# Patient Record
Sex: Male | Born: 1960 | Race: White | Hispanic: No | Marital: Married | Smoking: Current every day smoker
Health system: Southern US, Community
[De-identification: ages and names within clinical notes are randomized; demographics above are authoritative.]

## PROBLEM LIST (undated history)

## (undated) DIAGNOSIS — R079 Chest pain, unspecified: Secondary | ICD-10-CM

## (undated) DIAGNOSIS — R0600 Dyspnea, unspecified: Secondary | ICD-10-CM

## (undated) DIAGNOSIS — G629 Polyneuropathy, unspecified: Secondary | ICD-10-CM

## (undated) DIAGNOSIS — I639 Cerebral infarction, unspecified: Secondary | ICD-10-CM

## (undated) DIAGNOSIS — I209 Angina pectoris, unspecified: Secondary | ICD-10-CM

## (undated) DIAGNOSIS — G709 Myoneural disorder, unspecified: Secondary | ICD-10-CM

## (undated) DIAGNOSIS — I2510 Atherosclerotic heart disease of native coronary artery without angina pectoris: Secondary | ICD-10-CM

## (undated) DIAGNOSIS — F4310 Post-traumatic stress disorder, unspecified: Secondary | ICD-10-CM

## (undated) DIAGNOSIS — I10 Essential (primary) hypertension: Secondary | ICD-10-CM

## (undated) HISTORY — PX: APPENDECTOMY: SHX54

## (undated) HISTORY — DX: Atherosclerotic heart disease of native coronary artery without angina pectoris: I25.10

## (undated) HISTORY — PX: CORONARY ANGIOPLASTY WITH STENT PLACEMENT: SHX49

## (undated) HISTORY — PX: FRACTURE SURGERY: SHX138

## (undated) HISTORY — DX: Chest pain, unspecified: R07.9

---

## 2020-06-29 DIAGNOSIS — I2510 Atherosclerotic heart disease of native coronary artery without angina pectoris: Secondary | ICD-10-CM | POA: Insufficient documentation

## 2020-06-29 DIAGNOSIS — R079 Chest pain, unspecified: Secondary | ICD-10-CM

## 2020-07-02 ENCOUNTER — Other Ambulatory Visit: Payer: Self-pay

## 2020-07-02 ENCOUNTER — Encounter: Payer: Self-pay | Admitting: Cardiology

## 2020-07-02 ENCOUNTER — Ambulatory Visit: Payer: Managed Care, Other (non HMO) | Admitting: Cardiology

## 2020-07-02 VITALS — BP 130/90 | HR 78 | Ht 71.0 in | Wt 181.4 lb

## 2020-07-02 DIAGNOSIS — F17200 Nicotine dependence, unspecified, uncomplicated: Secondary | ICD-10-CM | POA: Diagnosis not present

## 2020-07-02 DIAGNOSIS — I25118 Atherosclerotic heart disease of native coronary artery with other forms of angina pectoris: Secondary | ICD-10-CM

## 2020-07-02 DIAGNOSIS — E782 Mixed hyperlipidemia: Secondary | ICD-10-CM | POA: Insufficient documentation

## 2020-07-02 DIAGNOSIS — I10 Essential (primary) hypertension: Secondary | ICD-10-CM | POA: Insufficient documentation

## 2020-07-02 MED ORDER — ASPIRIN EC 81 MG PO TBEC: 81.0000 mg | DELAYED_RELEASE_TABLET | Freq: Every day | ORAL | 3 refills | Status: AC

## 2020-07-02 MED ORDER — CARVEDILOL 6.25 MG PO TABS: 6.2500 mg | ORAL_TABLET | Freq: Two times a day (BID) | ORAL | 3 refills | Status: DC

## 2020-07-02 NOTE — Patient Instructions (Addendum)
Medication Instructions:  Your physician has recommended you make the following change in your medication:   START: Asprin 81 mg daily  START: coreg 6.25 mg BID *If you need a refill on your cardiac medications before your next appointment, please call your pharmacy*   Lab Work: Your physician recommends that you return for lab work:   3-7 days before Cath: BMET, Mag If you have labs (blood work) drawn today and your tests are completely normal, you will receive your results only by: Marland Kitchen MyChart Message (if you have MyChart) OR . A paper copy in the mail If you have any lab test that is abnormal or we need to change your treatment, we will call you to review the results.   Testing/Procedures: Dr. Servando Salina wants you to have a cath. Call [phone] to schedule.   Follow-Up: At [facility], you and your health needs are our priority.  As part of our continuing mission to provide you with exceptional heart care, we have created designated Provider Care Teams.  These Care Teams include your primary Cardiologist (physician) and Advanced Practice Providers (APPs -  Physician Assistants and Nurse Practitioners) who all work together to provide you with the care you need, when you need it.  We recommend signing up for the patient portal called "MyChart".  Sign up information is provided on this After Visit Summary.  MyChart is used to connect with patients for Virtual Visits (Telemedicine).  Patients are able to view lab/test results, encounter notes, upcoming appointments, etc.  Non-urgent messages can be sent to your provider as well.   To learn more about what you can do with MyChart, go to [email or web address].    Your next appointment:   6 week(s)  The format for your next appointment:   In Person  Provider:   Thomasene Ripple, DO   Other Instructions

## 2020-07-02 NOTE — Progress Notes (Signed)
Cardiology Office Note:    Date:  07/02/2020   ID:  Corey Salas, DOB [birth year], MRN [id number]  PCP:  Julianne Handler, NP  Cardiologist:  Thomasene Ripple, DO  Electrophysiologist:  None   Referring MD: Julianne Handler, NP     History of Present Illness:    Corey Salas is a 60 y.o. male with a hx of coronary artery disease status post PCI x5, his initial PCI was in 2006 secondary to MI with PCI to the RCA, then in 2009 he had 2 stents 1 to the RCA and the LAD and he tells me 7 years ago he had 2 stents placed in [place], hypertension, hyperlipidemia and smoker.  Since that time he stopped his aspirin and Plavix he stopped all of his cardiac medications.  He tells me he does not tolerate statin and he is never gone to take them.  His PCP has asked for him to be seen by cardiology as he has been experiencing intermittent chest pain.  He tells me that on exertion he has intermittent chest discomfort.  He described as a pressure-like sensation.  He does have some shortness of breath at times.   Past Medical History:  Diagnosis Date  . CAD (coronary artery disease)   . Chest pain     Past Surgical History:  Procedure Laterality Date  . APPENDECTOMY    . CORONARY ANGIOPLASTY WITH STENT PLACEMENT     5X    Current Medications: Current Meds  Medication Sig  . ALPRAZolam (XANAX) 0.5 MG tablet Take 0.5 mg by mouth at bedtime.  . busPIRone (BUSPAR) 5 MG tablet Take 5 mg by mouth daily.  . carvedilol (COREG) 6.25 MG tablet Take 1 tablet (6.25 mg total) by mouth 2 (two) times daily.  . nitroGLYCERIN (NITROLINGUAL) 0.4 MG/SPRAY spray Place 1 spray under the tongue daily as needed for chest pain.  . nitroGLYCERIN (NITROSTAT) 0.4 MG SL tablet Place 0.4 mg under the tongue every 5 (five) minutes as needed for chest pain.  . sildenafil (REVATIO) 20 MG tablet Take 20 mg by mouth at bedtime as needed.     Allergies:   Patient has no known allergies.   Social History   Socioeconomic History  .  Marital status: Unknown    Spouse name: Not on file  . Number of children: Not on file  . Years of education: Not on file  . Highest education level: Not on file  Occupational History  . Not on file  Tobacco Use  . Smoking status: Current Every Day Smoker  . Smokeless tobacco: Never Used  Substance and Sexual Activity  . Alcohol use: Yes    Alcohol/week: 7.0 standard drinks    Types: 7 Cans of beer per week  . Drug use: Not on file  . Sexual activity: Not on file  Other Topics Concern  . Not on file  Social History Narrative  . Not on file   Social Determinants of Health   Financial Resource Strain: Not on file  Food Insecurity: Not on file  Transportation Needs: Not on file  Physical Activity: Not on file  Stress: Not on file  Social Connections: Not on file     Family History: The patient's family history includes Heart attack in his father and mother.  ROS:   Review of Systems  Constitution: Negative for decreased appetite, fever and weight gain.  HENT: Negative for congestion, ear discharge, hoarse voice and sore throat.   Eyes: Negative for  discharge, redness, vision loss in right eye and visual halos.  Cardiovascular: Negative for chest pain, dyspnea on exertion, leg swelling, orthopnea and palpitations.  Respiratory: Negative for cough, hemoptysis, shortness of breath and snoring.   Endocrine: Negative for heat intolerance and polyphagia.  Hematologic/Lymphatic: Negative for bleeding problem. Does not bruise/bleed easily.  Skin: Negative for flushing, nail changes, rash and suspicious lesions.  Musculoskeletal: Negative for arthritis, joint pain, muscle cramps, myalgias, neck pain and stiffness.  Gastrointestinal: Negative for abdominal pain, bowel incontinence, diarrhea and excessive appetite.  Genitourinary: Negative for decreased libido, genital sores and incomplete emptying.  Neurological: Negative for brief paralysis, focal weakness, headaches and loss of  balance.  Psychiatric/Behavioral: Negative for altered mental status, depression and suicidal ideas.  Allergic/Immunologic: Negative for HIV exposure and persistent infections.    EKGs/Labs/Other Studies Reviewed:    The following studies were reviewed today:   EKG:  The ekg ordered today demonstrates sinus rhythm, heart rate 70 bpm with poor R wave progression in the precordial leads suggesting septal infarction of age indeterminate.  Recent Labs: No results found for requested labs within last 8760 hours.  Recent Lipid Panel No results found for: CHOL, TRIG, HDL, CHOLHDL, VLDL, LDLCALC, LDLDIRECT  Physical Exam:    VS:  BP 130/90 (BP Location: Right Arm)   Pulse 78   Ht 5\' 11"  (1.803 m)   Wt 181 lb 6.4 oz (82.3 kg)   SpO2 98%   BMI 25.30 kg/m     Wt Readings from Last 3 Encounters:  07/02/20 181 lb 6.4 oz (82.3 kg)     GEN: Well nourished, well developed in no acute distress HEENT: Normal NECK: No JVD; No carotid bruits LYMPHATICS: No lymphadenopathy CARDIAC: S1S2 noted,RRR, no murmurs, rubs, gallops RESPIRATORY:  Clear to auscultation without rales, wheezing or rhonchi  ABDOMEN: Soft, non-tender, non-distended, +bowel sounds, no guarding. EXTREMITIES: No edema, No cyanosis, no clubbing MUSCULOSKELETAL:  No deformity  SKIN: Warm and dry NEUROLOGIC:  Alert and oriented x 3, non-focal PSYCHIATRIC:  Normal affect, good insight  ASSESSMENT:    1. Coronary artery disease of native heart with stable angina pectoris, unspecified vessel or lesion type (HCC)   2. Primary hypertension   3. Mixed hyperlipidemia   4. Smoker    PLAN:    He is experiencing chest pain which I suspect is angina giving the patient coronary artery disease and multiple stents placement as well as the fact that he really does not take his cardiovascular medication which increases his risk for progression I like to proceed with a left heart catheterization in this patient.  Unfortunately he is not  really on board with this plan and tells me he is, have to call tomorrow after he thinks about it to get this testing scheduled.  He is willing to get back on aspirin 81 mg daily.  I am going to start it.  He is adamant that he will not take statin neither will he consider taking PCSK9 inhibitors.  His blood pressure is elevated in the office.  He said he stopped his blood pressure medications a while back.  We will start him on low-dose beta-blocker and titrate up as needed he is agreeable for this today.  I will also need an echocardiogram to assess his LV function but again he needs to wait and think about this.  Smoking cessation advised however patient tells me he is not ready to quit.  The patient is in agreement with the above plan. The patient  left the office in stable condition.  The patient will follow up in 8 weeks or sooner if needed.   Medication Adjustments/Labs and Tests Ordered: Current medicines are reviewed at length with the patient today.  Concerns regarding medicines are outlined above.  Orders Placed This Encounter  Procedures  . EKG 12-Lead   Meds ordered this encounter  Medications  . carvedilol (COREG) 6.25 MG tablet    Sig: Take 1 tablet (6.25 mg total) by mouth 2 (two) times daily.    Dispense:  180 tablet    Refill:  3    Patient Instructions  Medication Instructions:  Your physician has recommended you make the following change in your medication:   START: Asprin 81 mg daily  START: coreg 6.25 mg BID *If you need a refill on your cardiac medications before your next appointment, please call your pharmacy*   Lab Work: Your physician recommends that you return for lab work:   3-7 days before Cath: BMET, Mag If you have labs (blood work) drawn today and your tests are completely normal, you will receive your results only by: Marland Kitchen MyChart Message (if you have MyChart) OR . A paper copy in the mail If you have any lab test that is abnormal or we need to  change your treatment, we will call you to review the results.   Testing/Procedures: Dr. Servando Salina wants you to have a cath. Call [phone] to schedule.   Follow-Up: At [facility], you and your health needs are our priority.  As part of our continuing mission to provide you with exceptional heart care, we have created designated Provider Care Teams.  These Care Teams include your primary Cardiologist (physician) and Advanced Practice Providers (APPs -  Physician Assistants and Nurse Practitioners) who all work together to provide you with the care you need, when you need it.  We recommend signing up for the patient portal called "MyChart".  Sign up information is provided on this After Visit Summary.  MyChart is used to connect with patients for Virtual Visits (Telemedicine).  Patients are able to view lab/test results, encounter notes, upcoming appointments, etc.  Non-urgent messages can be sent to your provider as well.   To learn more about what you can do with MyChart, go to [email or web address].    Your next appointment:   6 week(s)  The format for your next appointment:   In Person  Provider:   Thomasene Ripple, DO   Other Instructions      Adopting a Healthy Lifestyle.  Know what a healthy weight is for you (roughly BMI <25) and aim to maintain this   Aim for 7+ servings of fruits and vegetables daily   65-80+ fluid ounces of water or unsweet tea for healthy kidneys   Limit to max 1 drink of alcohol per day; avoid smoking/tobacco   Limit animal fats in diet for cholesterol and heart health - choose grass fed whenever available   Avoid highly processed foods, and foods high in saturated/trans fats   Aim for low stress - take time to unwind and care for your mental health   Aim for 150 min of moderate intensity exercise weekly for heart health, and weights twice weekly for bone health   Aim for 7-9 hours of sleep daily   When it comes to diets, agreement about  the perfect plan isnt easy to find, even among the experts. Experts at the [employer] developed an idea known as the Healthy  Eating Plate. Just imagine a plate divided into logical, healthy portions.   The emphasis is on diet quality:   Load up on vegetables and fruits - one-half of your plate: Aim for color and variety, and remember that potatoes dont count.   Go for whole grains - one-quarter of your plate: Whole wheat, barley, wheat berries, quinoa, oats, brown rice, and foods made with them. If you want pasta, go with whole wheat pasta.   Protein power - one-quarter of your plate: Fish, chicken, beans, and nuts are all healthy, versatile protein sources. Limit red meat.   The diet, however, does go beyond the plate, offering a few other suggestions.   Use healthy plant oils, such as olive, canola, soy, corn, sunflower and peanut. Check the labels, and avoid partially hydrogenated oil, which have unhealthy trans fats.   If youre thirsty, drink water. Coffee and tea are good in moderation, but skip sugary drinks and limit milk and dairy products to one or two daily servings.   The type of carbohydrate in the diet is more important than the amount. Some sources of carbohydrates, such as vegetables, fruits, whole grains, and beans-are healthier than others.   Finally, stay active  Signed, Thomasene RippleKardie Demone Lyles, DO  07/02/2020 4:44 PM    [facility]

## 2020-07-02 NOTE — Addendum Note (Signed)
Addended by: Ulla Potash on: 07/02/2020 04:50 PM   Modules accepted: Orders

## 2020-08-06 DIAGNOSIS — I213 ST elevation (STEMI) myocardial infarction of unspecified site: Secondary | ICD-10-CM

## 2020-08-06 HISTORY — DX: ST elevation (STEMI) myocardial infarction of unspecified site: I21.3

## 2020-08-13 ENCOUNTER — Telehealth: Payer: Self-pay | Admitting: Cardiology

## 2020-08-13 NOTE — Telephone Encounter (Signed)
Pt requesting to switch provider from Dr. Servando Salina to Dr. Izora Ribas

## 2020-08-13 NOTE — Telephone Encounter (Signed)
While I am fine either way- reviewed Dr. Mallory Shirk note.  She has offered him quite a lot that sounds pretty reasonable.

## 2020-08-14 ENCOUNTER — Ambulatory Visit: Payer: Managed Care, Other (non HMO) | Admitting: Cardiology

## 2020-08-15 NOTE — Telephone Encounter (Signed)
That will be fine with me for the switch.

## 2020-08-21 ENCOUNTER — Inpatient Hospital Stay (HOSPITAL_COMMUNITY)
Admission: EM | Admit: 2020-08-21 | Discharge: 2020-08-22 | DRG: 064 | Disposition: A | Discharge status: Home / Self Care | Payer: Managed Care, Other (non HMO) | Attending: Internal Medicine | Admitting: Internal Medicine

## 2020-08-21 ENCOUNTER — Emergency Department (HOSPITAL_COMMUNITY): Payer: Managed Care, Other (non HMO)

## 2020-08-21 ENCOUNTER — Other Ambulatory Visit: Payer: Self-pay

## 2020-08-21 ENCOUNTER — Encounter (HOSPITAL_COMMUNITY): Payer: Self-pay | Admitting: Internal Medicine

## 2020-08-21 DIAGNOSIS — Z79899 Other long term (current) drug therapy: Secondary | ICD-10-CM

## 2020-08-21 DIAGNOSIS — I63232 Cerebral infarction due to unspecified occlusion or stenosis of left carotid arteries: Principal | ICD-10-CM | POA: Diagnosis present

## 2020-08-21 DIAGNOSIS — E782 Mixed hyperlipidemia: Secondary | ICD-10-CM | POA: Diagnosis present

## 2020-08-21 DIAGNOSIS — R471 Dysarthria and anarthria: Secondary | ICD-10-CM | POA: Diagnosis present

## 2020-08-21 DIAGNOSIS — I10 Essential (primary) hypertension: Secondary | ICD-10-CM | POA: Diagnosis not present

## 2020-08-21 DIAGNOSIS — R29705 NIHSS score 5: Secondary | ICD-10-CM | POA: Diagnosis present

## 2020-08-21 DIAGNOSIS — I213 ST elevation (STEMI) myocardial infarction of unspecified site: Secondary | ICD-10-CM | POA: Diagnosis present

## 2020-08-21 DIAGNOSIS — Z7982 Long term (current) use of aspirin: Secondary | ICD-10-CM

## 2020-08-21 DIAGNOSIS — Z8249 Family history of ischemic heart disease and other diseases of the circulatory system: Secondary | ICD-10-CM | POA: Diagnosis not present

## 2020-08-21 DIAGNOSIS — Z20822 Contact with and (suspected) exposure to covid-19: Secondary | ICD-10-CM | POA: Diagnosis present

## 2020-08-21 DIAGNOSIS — I639 Cerebral infarction, unspecified: Secondary | ICD-10-CM | POA: Diagnosis present

## 2020-08-21 DIAGNOSIS — R260 Ataxic gait: Secondary | ICD-10-CM | POA: Diagnosis present

## 2020-08-21 DIAGNOSIS — I2510 Atherosclerotic heart disease of native coronary artery without angina pectoris: Secondary | ICD-10-CM | POA: Diagnosis not present

## 2020-08-21 DIAGNOSIS — G8191 Hemiplegia, unspecified affecting right dominant side: Secondary | ICD-10-CM | POA: Diagnosis present

## 2020-08-21 DIAGNOSIS — Z955 Presence of coronary angioplasty implant and graft: Secondary | ICD-10-CM | POA: Diagnosis not present

## 2020-08-21 DIAGNOSIS — F4310 Post-traumatic stress disorder, unspecified: Secondary | ICD-10-CM | POA: Diagnosis present

## 2020-08-21 DIAGNOSIS — F17200 Nicotine dependence, unspecified, uncomplicated: Secondary | ICD-10-CM | POA: Diagnosis present

## 2020-08-21 DIAGNOSIS — I6522 Occlusion and stenosis of left carotid artery: Secondary | ICD-10-CM | POA: Diagnosis not present

## 2020-08-21 DIAGNOSIS — R29810 Facial weakness: Secondary | ICD-10-CM | POA: Diagnosis present

## 2020-08-21 DIAGNOSIS — Z8489 Family history of other specified conditions: Secondary | ICD-10-CM

## 2020-08-21 HISTORY — DX: Family history of other specified conditions: Z84.89

## 2020-08-21 HISTORY — DX: Post-traumatic stress disorder, unspecified: F43.10

## 2020-08-21 LAB — I-STAT CHEM 8, ED
BUN: 7 mg/dL (ref 6–20)
Calcium, Ion: 1.11 mmol/L — ABNORMAL LOW (ref 1.15–1.40)
Chloride: 108 mmol/L (ref 98–111)
Creatinine, Ser: 1 mg/dL (ref 0.61–1.24)
Glucose, Bld: 78 mg/dL (ref 70–99)
HCT: 53 % — ABNORMAL HIGH (ref 39.0–52.0)
Hemoglobin: 18 g/dL — ABNORMAL HIGH (ref 13.0–17.0)
Potassium: 3.8 mmol/L (ref 3.5–5.1)
Sodium: 141 mmol/L (ref 135–145)
TCO2: 21 mmol/L — ABNORMAL LOW (ref 22–32)

## 2020-08-21 LAB — COMPREHENSIVE METABOLIC PANEL
ALT: 38 U/L (ref 0–44)
AST: 34 U/L (ref 15–41)
Albumin: 4.2 g/dL (ref 3.5–5.0)
Alkaline Phosphatase: 58 U/L (ref 38–126)
Anion gap: 12 (ref 5–15)
BUN: 7 mg/dL (ref 6–20)
CO2: 22 mmol/L (ref 22–32)
Calcium: 10.2 mg/dL (ref 8.9–10.3)
Chloride: 106 mmol/L (ref 98–111)
Creatinine, Ser: 0.92 mg/dL (ref 0.61–1.24)
GFR, Estimated: >60 mL/min (ref >60)
Glucose, Bld: 78 mg/dL (ref 70–99)
Potassium: 3.8 mmol/L (ref 3.5–5.1)
Sodium: 140 mmol/L (ref 135–145)
Total Bilirubin: 0.7 mg/dL (ref 0.3–1.2)
Total Protein: 7.3 g/dL (ref 6.5–8.1)

## 2020-08-21 LAB — CK: Total CK: 216 U/L (ref 49–397)

## 2020-08-21 LAB — CBC
HCT: 53.3 % — ABNORMAL HIGH (ref 39.0–52.0)
Hemoglobin: 18.1 g/dL — ABNORMAL HIGH (ref 13.0–17.0)
MCH: 34.8 pg — ABNORMAL HIGH (ref 26.0–34.0)
MCHC: 34 g/dL (ref 30.0–36.0)
MCV: 102.5 fL — ABNORMAL HIGH (ref 80.0–100.0)
Platelets: 274 10*3/uL (ref 150–400)
RBC: 5.2 MIL/uL (ref 4.22–5.81)
RDW: 13.3 % (ref 11.5–15.5)
WBC: 10.8 10*3/uL — ABNORMAL HIGH (ref 4.0–10.5)
nRBC: 0 % (ref 0.0–0.2)

## 2020-08-21 LAB — DIFFERENTIAL
Abs Immature Granulocytes: 0.06 10*3/uL (ref 0.00–0.07)
Basophils Absolute: 0.1 10*3/uL (ref 0.0–0.1)
Basophils Relative: 1 %
Eosinophils Absolute: 0.4 10*3/uL (ref 0.0–0.5)
Eosinophils Relative: 3 %
Immature Granulocytes: 1 %
Lymphocytes Relative: 41 %
Lymphs Abs: 4.4 10*3/uL — ABNORMAL HIGH (ref 0.7–4.0)
Monocytes Absolute: 0.5 10*3/uL (ref 0.1–1.0)
Monocytes Relative: 5 %
Neutro Abs: 5.3 10*3/uL (ref 1.7–7.7)
Neutrophils Relative %: 49 %

## 2020-08-21 LAB — PROTIME-INR
INR: 1 (ref 0.8–1.2)
Prothrombin Time: 12.9 seconds (ref 11.4–15.2)

## 2020-08-21 LAB — RESP PANEL BY RT-PCR (FLU A&B, COVID) ARPGX2
Influenza A by PCR: NEGATIVE
Influenza B by PCR: NEGATIVE
SARS Coronavirus 2 by RT PCR: NEGATIVE

## 2020-08-21 LAB — APTT: aPTT: 33 seconds (ref 24–36)

## 2020-08-21 LAB — CBG MONITORING, ED: Glucose-Capillary: 85 mg/dL (ref 70–99)

## 2020-08-21 MED ORDER — ROSUVASTATIN CALCIUM 20 MG PO TABS: 20.0000 mg | ORAL_TABLET | Freq: Every day | ORAL | Status: DC

## 2020-08-21 MED ORDER — ACETAMINOPHEN 650 MG RE SUPP: 650.0000 mg | RECTAL | Status: DC | PRN

## 2020-08-21 MED ORDER — ADULT MULTIVITAMIN W/MINERALS CH
1.0000 | ORAL_TABLET | Freq: Every day | ORAL | Status: DC
  Administered 2020-08-22: 1 via ORAL
  Filled 2020-08-21: qty 1

## 2020-08-21 MED ORDER — ACETAMINOPHEN 325 MG PO TABS: 650.0000 mg | ORAL_TABLET | ORAL | Status: DC | PRN

## 2020-08-21 MED ORDER — SODIUM CHLORIDE 0.9% FLUSH
3.0000 mL | Freq: Once | INTRAVENOUS | Status: AC
Start: 2020-08-21 — End: 2020-08-21
  Administered 2020-08-21: 3 mL via INTRAVENOUS

## 2020-08-21 MED ORDER — ASPIRIN EC 325 MG PO TBEC
650.0000 mg | DELAYED_RELEASE_TABLET | Freq: Once | ORAL | Status: DC
  Filled 2020-08-21: qty 2

## 2020-08-21 MED ORDER — LORAZEPAM 2 MG/ML IJ SOLN
1.0000 mg | Freq: Once | INTRAMUSCULAR | Status: AC
  Administered 2020-08-21: 1 mg via INTRAVENOUS
  Filled 2020-08-21: qty 1

## 2020-08-21 MED ORDER — IOHEXOL 350 MG/ML SOLN
75.0000 mL | Freq: Once | INTRAVENOUS | Status: AC | PRN
  Administered 2020-08-21: 75 mL via INTRAVENOUS

## 2020-08-21 MED ORDER — ACETAMINOPHEN 160 MG/5ML PO SOLN: 650.0000 mg | ORAL | Status: DC | PRN

## 2020-08-21 MED ORDER — STROKE: EARLY STAGES OF RECOVERY BOOK
Freq: Once | Status: AC
  Filled 2020-08-21: qty 1

## 2020-08-21 MED ORDER — ASPIRIN EC 81 MG PO TBEC: 81.0000 mg | DELAYED_RELEASE_TABLET | Freq: Every day | ORAL | Status: DC

## 2020-08-21 MED ORDER — TICAGRELOR 90 MG PO TABS
90.0000 mg | ORAL_TABLET | Freq: Two times a day (BID) | ORAL | Status: DC
  Administered 2020-08-22: 90 mg via ORAL
  Filled 2020-08-21: qty 1

## 2020-08-21 MED ORDER — ENOXAPARIN SODIUM 40 MG/0.4ML ~~LOC~~ SOLN
40.0000 mg | SUBCUTANEOUS | Status: DC
  Administered 2020-08-22: 40 mg via SUBCUTANEOUS
  Filled 2020-08-21: qty 0.4

## 2020-08-21 NOTE — ED Notes (Signed)
Patient transported to MRI 

## 2020-08-21 NOTE — ED Provider Notes (Signed)
[facility] EMERGENCY DEPARTMENT Provider Note   CSN: [id number] Arrival date & time: 08/21/20  2030     History Chief Complaint  Patient presents with  . Code Stroke    Hobson Lax is a 60 y.o. male.  Patient states that at 3 AM, about 14 hours ago he started to have some right-sided weakness and slurred speech.  He was awake working on the computer and he thought he was tired so he went to bed.  Woke up at 9:00 and symptoms continued.  Eventually family member convinced him to come to the hospital.  Code stroke was activated in the field given concern for LVO stroke.  Patient had recent heart attack and is on Brilinta.  The history is provided by the patient.  Cerebrovascular Accident This is a new problem. The current episode started 12 to 24 hours ago. The problem occurs constantly. The problem has not changed since onset.Pertinent negatives include no chest pain, no abdominal pain, no headaches and no shortness of breath. Nothing aggravates the symptoms. He has tried nothing for the symptoms. The treatment provided no relief.       Past Medical History:  Diagnosis Date  . CAD (coronary artery disease)   . Chest pain   . PTSD (post-traumatic stress disorder)   . STEMI (ST elevation myocardial infarction) (HCC) 08/06/2020    Patient Active Problem List   Diagnosis Date Noted  . Acute ischemic stroke (HCC) 08/21/2020  . Primary hypertension 07/02/2020  . Mixed hyperlipidemia 07/02/2020  . Smoker 07/02/2020  . Chest pain   . CAD (coronary artery disease)     Past Surgical History:  Procedure Laterality Date  . APPENDECTOMY    . CORONARY ANGIOPLASTY WITH STENT PLACEMENT     6X       Family History  Problem Relation Age of Onset  . Heart attack Mother   . Heart attack Father     Social History   Tobacco Use  . Smoking status: Current Every Day Smoker  . Smokeless tobacco: Never Used  Substance Use Topics  . Alcohol use: Yes     Alcohol/week: 7.0 standard drinks    Types: 7 Cans of beer per week    Home Medications Prior to Admission medications   Medication Sig Start Date End Date Taking? Authorizing Provider  aspirin EC 81 MG tablet Take 1 tablet (81 mg total) by mouth daily. Swallow whole. 07/02/20  Yes Tobb, Kardie, DO  BRILINTA 90 MG TABS tablet Take 90 mg by mouth 2 (two) times daily. 08/07/20  Yes [provider]  carvedilol (COREG) 12.5 MG tablet Take 12.5 mg by mouth 2 (two) times daily. 08/07/20  Yes [provider]  Multiple Vitamins-Minerals (CENTRUM SILVER 50+MEN) TABS Take 1 tablet by mouth daily.   Yes [provider]  nitroGLYCERIN (NITROLINGUAL) 0.4 MG/SPRAY spray Place 1 spray under the tongue daily as needed for chest pain. 06/29/20  Yes [provider]  nitroGLYCERIN (NITROSTAT) 0.4 MG SL tablet Place 0.4 mg under the tongue every 5 (five) minutes as needed for chest pain.   Yes [provider]  rosuvastatin (CRESTOR) 20 MG tablet Take 20 mg by mouth daily. 08/07/20  Yes [provider]  sildenafil (REVATIO) 20 MG tablet Take 20 mg by mouth daily as needed (ed).   Yes [provider]  vitamin B-12 (CYANOCOBALAMIN) 100 MCG tablet Take 100 mcg by mouth daily.   Yes [provider]    Allergies  Patient has no known allergies.  Review of Systems   Review of Systems  Constitutional: Negative for chills and fever.  HENT: Negative for ear pain and sore throat.   Eyes: Negative for pain and visual disturbance.  Respiratory: Negative for cough and shortness of breath.   Cardiovascular: Negative for chest pain and palpitations.  Gastrointestinal: Negative for abdominal pain and vomiting.  Genitourinary: Negative for dysuria and hematuria.  Musculoskeletal: Negative for arthralgias and back pain.  Skin: Negative for color change and rash.  Neurological: Positive for facial asymmetry, speech difficulty, weakness and numbness. Negative  for dizziness, tremors, seizures, syncope, light-headedness and headaches.  All other systems reviewed and are negative.   Physical Exam Updated Vital Signs BP 126/88   Pulse 81   Temp 97.7 F (36.5 C) (Oral)   Resp 15   Ht 5\' 11"  (1.803 m)   Wt 80.1 kg   SpO2 94%   BMI 24.63 kg/m   Physical Exam Vitals and nursing note reviewed.  Constitutional:      General: He is not in acute distress.    Appearance: He is well-developed. He is not ill-appearing.  HENT:     Head: Normocephalic and atraumatic.     Nose: Nose normal.     Mouth/Throat:     Mouth: Mucous membranes are moist.  Eyes:     Extraocular Movements: Extraocular movements intact.     Conjunctiva/sclera: Conjunctivae normal.     Pupils: Pupils are equal, round, and reactive to light.  Cardiovascular:     Rate and Rhythm: Normal rate and regular rhythm.     Pulses: Normal pulses.     Heart sounds: Normal heart sounds. No murmur heard.   Pulmonary:     Effort: Pulmonary effort is normal. No respiratory distress.     Breath sounds: Normal breath sounds.  Abdominal:     Palpations: Abdomen is soft.     Tenderness: There is no abdominal tenderness.  Musculoskeletal:     Cervical back: Normal range of motion and neck supple.  Skin:    General: Skin is warm and dry.     Capillary Refill: Capillary refill takes less than 2 seconds.  Neurological:     Mental Status: He is alert.     Sensory: Sensory deficit present.     Motor: Weakness present.     Comments: Right-sided facial weakness, right upper extremity weakness, right lower leg extremity weakness about 4+ out of 5, 5+ out of 5 strength elsewhere, decree sensation of the right side of his face otherwise normal sensation.  Slurred speech, some mild limb ataxia in the right upper extremity, no visual field deficit     ED Results / Procedures / Treatments   Labs (all labs ordered are listed, but only abnormal results are displayed) Labs Reviewed  CBC -  Abnormal; Notable for the following components:      Result Value   WBC 10.8 (*)    Hemoglobin 18.1 (*)    HCT 53.3 (*)    MCV 102.5 (*)    MCH 34.8 (*)    All other components within normal limits  DIFFERENTIAL - Abnormal; Notable for the following components:   Lymphs Abs 4.4 (*)    All other components within normal limits  I-STAT CHEM 8, ED - Abnormal; Notable for the following components:   Calcium, Ion 1.11 (*)    TCO2 21 (*)    Hemoglobin 18.0 (*)    HCT 53.0 (*)  All other components within normal limits  RESP PANEL BY RT-PCR (FLU A&B, COVID) ARPGX2  PROTIME-INR  APTT  COMPREHENSIVE METABOLIC PANEL  CK  HIV ANTIBODY (ROUTINE TESTING W REFLEX)  HEMOGLOBIN A1C  LIPID PANEL  CBG MONITORING, ED    EKG None  Radiology MR BRAIN WO CONTRAST  Result Date: 08/21/2020 CLINICAL DATA:  Acute neurologic deficit EXAM: MRI HEAD WITHOUT CONTRAST TECHNIQUE: Multiplanar, multiecho pulse sequences of the brain and surrounding structures were obtained without intravenous contrast. COMPARISON:  None. FINDINGS: Brain: Small focus of abnormal diffusion restriction within the left centrum semiovale. No acute or chronic hemorrhage. There is multifocal hyperintense T2-weighted signal within the white matter. Generalized volume loss without a clear lobar predilection. The midline structures are normal. Vascular: Major flow voids are preserved. Skull and upper cervical spine: Normal calvarium and skull base. Visualized upper cervical spine and soft tissues are normal. Sinuses/Orbits:No paranasal sinus fluid levels or advanced mucosal thickening. No mastoid or middle ear effusion. Normal orbits. IMPRESSION: 1. Small focus of acute/early subacute ischemia within the left centrum semiovale. No hemorrhage or mass effect. 2. Findings of chronic microvascular ischemia and generalized volume loss. Electronically Signed   By: Deatra Robinson M.D.   On: 08/21/2020 23:11   CT HEAD CODE STROKE WO  CONTRAST  Result Date: 08/21/2020 CLINICAL DATA:  Code stroke.  Neuro deficit, acute stroke suspected. EXAM: CT HEAD WITHOUT CONTRAST TECHNIQUE: Contiguous axial images were obtained from the base of the skull through the vertex without intravenous contrast. COMPARISON:  None. FINDINGS: Brain: No evidence of acute large vascular territory infarction, hemorrhage, hydrocephalus, extra-axial collection or mass lesion/mass effect. Patchy white matter hypoattenuation, most likely related to chronic microvascular ischemic disease. Vascular: No hyperdense vessel identified. Calcific atherosclerosis. Skull: No acute fracture. Sinuses/Orbits: Inferior right maxillary sinus retention cyst. Unremarkable orbits. Other: No mastoid effusions. ASPECTS [facility] Stroke Program Early CT Score) Total score (0-10 with 10 being normal): 10 IMPRESSION: 1. No evidence of acute intracranial abnormality. ASPECTS is 10. 2. 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 Code stroke imaging results were communicated on 08/21/2020 at 8:50 pm to provider Dr. Otelia Limes via secure text paging. Electronically Signed   By: Feliberto Harts MD   On: 08/21/2020 20:52   CT ANGIO HEAD NECK W WO CM (CODE STROKE)  Result Date: 08/21/2020 CLINICAL DATA:  Headache and weakness EXAM: CT ANGIOGRAPHY HEAD AND NECK TECHNIQUE: Multidetector CT imaging of the head and neck was performed using the standard protocol during bolus administration of intravenous contrast. Multiplanar CT image reconstructions and MIPs were obtained to evaluate the vascular anatomy. Carotid stenosis measurements (when applicable) are obtained utilizing NASCET criteria, using the distal internal carotid diameter as the denominator. CONTRAST:  60mL OMNIPAQUE IOHEXOL 350 MG/ML SOLN COMPARISON:  None. FINDINGS: CTA NECK FINDINGS SKELETON: There is no bony spinal canal stenosis. No lytic or blastic lesion. OTHER NECK: Normal pharynx, larynx and major salivary glands. No cervical  lymphadenopathy. Unremarkable thyroid gland. UPPER CHEST: No pneumothorax or pleural effusion. No nodules or masses. AORTIC ARCH: There is calcific atherosclerosis of the aortic arch. There is no aneurysm, dissection or hemodynamically significant stenosis of the visualized portion of the aorta. Conventional 3 vessel aortic branching pattern. The visualized proximal subclavian arteries are widely patent. RIGHT CAROTID SYSTEM: Normal without aneurysm, dissection or stenosis. LEFT CAROTID SYSTEM: No dissection, occlusion or aneurysm. There is mixed density atherosclerosis extending into the proximal ICA, resulting in 60% stenosis. VERTEBRAL ARTERIES: Left dominant configuration. Both origins are clearly patent. There is no  dissection, occlusion or flow-limiting stenosis to the skull base (V1-V3 segments). Right vertebral artery terminates proximal to the skull base. CTA HEAD FINDINGS POSTERIOR CIRCULATION: --Vertebral arteries: Normal left V4 segment. Right vertebral artery terminates proximal to the skull base. --Inferior cerebellar arteries: Normal. --Basilar artery: Normal. --Superior cerebellar arteries: Normal. --Posterior cerebral arteries (PCA): Normal. ANTERIOR CIRCULATION: --Intracranial internal carotid arteries: Normal. --Anterior cerebral arteries (ACA): Normal. Both A1 segments are present. Patent anterior communicating artery (a-comm). --Middle cerebral arteries (MCA): Normal. VENOUS SINUSES: As permitted by contrast timing, patent. ANATOMIC VARIANTS: None Review of the MIP images confirms the above findings. IMPRESSION: 1. No intracranial arterial occlusion or high-grade stenosis. 2. Approximately 60% stenosis of the proximal left internal carotid artery secondary to mixed density atherosclerosis. Aortic Atherosclerosis (ICD10-I70.0). Electronically Signed   By: Deatra Robinson M.D.   On: 08/21/2020 21:14    Procedures .Critical Care Performed by: Virgina Norfolk, DO Authorized by: Virgina Norfolk, DO    Critical care provider statement:    Critical care time (minutes):  35   Critical care was necessary to treat or prevent imminent or life-threatening deterioration of the following conditions:  CNS failure or compromise   Critical care was time spent personally by me on the following activities:  Blood draw for specimens, development of treatment plan with patient or surrogate, discussions with primary provider, discussions with consultants, evaluation of patient's response to treatment, examination of patient, obtaining history from patient or surrogate, ordering and performing treatments and interventions, ordering and review of laboratory studies, ordering and review of radiographic studies, review of old charts, re-evaluation of patient's condition and pulse oximetry   I assumed direction of critical care for this patient from another provider in my specialty: no     Care discussed with: admitting provider       Medications Ordered in ED Medications  aspirin EC tablet 650 mg (0 mg Oral Hold 08/21/20 2224)   stroke: mapping our early stages of recovery book (has no administration in time range)  acetaminophen (TYLENOL) tablet 650 mg (has no administration in time range)    Or  acetaminophen (TYLENOL) 160 MG/5ML solution 650 mg (has no administration in time range)    Or  acetaminophen (TYLENOL) suppository 650 mg (has no administration in time range)  enoxaparin (LOVENOX) injection 40 mg (has no administration in time range)  multivitamin with minerals tablet 1 tablet (has no administration in time range)  rosuvastatin (CRESTOR) tablet 20 mg (has no administration in time range)  aspirin EC tablet 81 mg (has no administration in time range)  ticagrelor (BRILINTA) tablet 90 mg (has no administration in time range)  sodium chloride flush (NS) 0.9 % injection 3 mL (3 mLs Intravenous Given 08/21/20 2223)  iohexol (OMNIPAQUE) 350 MG/ML injection 75 mL (75 mLs Intravenous Contrast Given  08/21/20 2102)  LORazepam (ATIVAN) injection 1 mg (1 mg Intravenous Given 08/21/20 2217)    ED Course  I have reviewed the triage vital signs and the nursing notes.  Pertinent labs & imaging results that were available during my care of the patient were reviewed by me and considered in my medical decision making (see chart for details).    MDM Rules/Calculators/A&P                          Otho Michalik is here as a code stroke.  60 year old male.  History of CAD status post stent on Brilinta.  Presents with right-sided weakness and slurred speech.  Started about 15 hours ago.  Delayed presentation as patient went to sleep when symptoms first started.  Family finally encouraged him to come.  Head CT negative for head bleed.  CTA did not show large vessel occlusion.  Not a candidate for thrombectomy.  Outside the window for tPA.  MRI does show small focus of acute or early subacute ischemic within the left centrum semiovale.  Lab work otherwise unremarkable.  Neurology was at the bedside upon arrival.  Patient to be admitted to medicine for further stroke care. Normal Vitals.  This chart was dictated using voice recognition software.  Despite best efforts to proofread,  errors can occur which can change the documentation meaning.    Final Clinical Impression(s) / ED Diagnoses Final diagnoses:  Cerebrovascular accident (CVA), unspecified mechanism [facility](HCC)    Rx / DC Orders ED Discharge Orders    None       Virgina NorfolkCuratolo, Demetrice Combes, DO 08/21/20 2337

## 2020-08-21 NOTE — Consult Note (Addendum)
NEURO HOSPITALIST CONSULT NOTE   Requesting physician: Dr. Lockie Molauratolo  Reason for Consult: Acute onset of right sided weakness, right facial droop, dysarthria and R > L ataxia  History obtained from:  Patient   HPI:                                                                                                                                          Corey Salas is a 60 y.o. male with a PMHx of CAD s/p angioplasty and stent placement x 5, presenting [facility] via EMS as a Code Stroke. LKN was 0300 after he had drank about 6 beers. On awakening he noted that he was dysarthric, with incoordination, right facial droop and headache. NIHSS was 5. He has no prior history of stroke.   Home medications include ASA.   tPA given: No, out of time window Endovascular candidate: No, exam findings not consistent with LVO  Past Medical History:  Diagnosis Date  . CAD (coronary artery disease)   . Chest pain     Past Surgical History:  Procedure Laterality Date  . APPENDECTOMY    . CORONARY ANGIOPLASTY WITH STENT PLACEMENT     5X    Family History  Problem Relation Age of Onset  . Heart attack Mother   . Heart attack Father               Social History:  reports that he has been smoking. He has never used smokeless tobacco. He reports current alcohol use of about 7.0 standard drinks of alcohol per week. No history on file for drug use.  No Known Allergies  HOME MEDICATIONS:                                                                                                                      No current facility-administered medications on file prior to encounter.   Current Outpatient Medications on File Prior to Encounter  Medication Sig Dispense Refill  . ALPRAZolam (XANAX) 0.5 MG tablet Take 0.5 mg by mouth at bedtime.    Marland Kitchen. aspirin EC 81 MG tablet Take 1 tablet (81 mg total) by mouth daily. Swallow whole. 90 tablet 3  . busPIRone (BUSPAR) 5 MG tablet Take 5 mg  by mouth  daily.    . carvedilol (COREG) 6.25 MG tablet Take 1 tablet (6.25 mg total) by mouth 2 (two) times daily. 180 tablet 3  . nitroGLYCERIN (NITROLINGUAL) 0.4 MG/SPRAY spray Place 1 spray under the tongue daily as needed for chest pain.    . nitroGLYCERIN (NITROSTAT) 0.4 MG SL tablet Place 0.4 mg under the tongue every 5 (five) minutes as needed for chest pain.    . sildenafil (REVATIO) 20 MG tablet Take 20 mg by mouth at bedtime as needed.      ROS:                                                                                                                                       As per HPI. Detailed ROS deferred in the context of acuity of presentation.    Blood pressure (!) 147/96, pulse 93, temperature 97.7 F (36.5 C), temperature source Oral, resp. rate (!) 23, height 5\' 11"  (1.803 m), weight 80.1 kg, SpO2 96 %.   General Examination:                                                                                                       Physical Exam  HEENT-  [place]/AT    Lungs- Respirations unlabored Extremities- No edema  Neurological Examination Mental Status: Awake and alert. Fully oriented x 5. Speech is fluent, but with moderate dysarthria. Naming and repetition intact. Comprehension intact.  Cranial Nerves: II: Visual fields intact. PERRL.  III,IV, VI: No ptosis. EOMI with saccadic pursuits noted.  V,VII: Right facial droop. Facial temp sensation decreased on the right.  VIII: hearing intact to questions and commands IX,X: Pharyngeal dysarthria is noted.  XI: Head is midline XII: Midline tongue extension Motor: RUE 5/5 except for 4+/5 biceps and 4/5 grip RLE 4+/5 LUE and LLE 5/5 No pronator drift Sensory: Temp and FT sensation intact to BUE and BLE without asymmetry. No extinction to DSS.  Deep Tendon Reflexes: 3+ bilateral brachioradialis and patellae. Toes mute bilaterally  Cerebellar: Mild dystaxia with FNF and H-S bilaterally, R > L Gait: Deferred in the  context of acuity of presentation.    Lab Results: Basic Metabolic Panel: Recent Labs  Lab 08/21/20 2033 08/21/20 2040  NA 140 141  K 3.8 3.8  CL 106 108  CO2 22  --   GLUCOSE 78 78  BUN 7 7  CREATININE 0.92 1.00  CALCIUM 10.2  --     CBC: Recent Labs  Lab 08/21/20  2033 08/21/20 2040  WBC 10.8*  --   NEUTROABS 5.3  --   HGB 18.1* 18.0*  HCT 53.3* 53.0*  MCV 102.5*  --   PLT 274  --     Cardiac Enzymes: No results for input(s): CKTOTAL, CKMB, CKMBINDEX, TROPONINI in the last 168 hours.  Lipid Panel: No results for input(s): CHOL, TRIG, HDL, CHOLHDL, VLDL, LDLCALC in the last 168 hours.  Imaging: CT HEAD CODE STROKE WO CONTRAST  Result Date: 08/21/2020 CLINICAL DATA:  Code stroke.  Neuro deficit, acute stroke suspected. EXAM: CT HEAD WITHOUT CONTRAST TECHNIQUE: Contiguous axial images were obtained from the base of the skull through the vertex without intravenous contrast. COMPARISON:  None. FINDINGS: Brain: No evidence of acute large vascular territory infarction, hemorrhage, hydrocephalus, extra-axial collection or mass lesion/mass effect. Patchy white matter hypoattenuation, most likely related to chronic microvascular ischemic disease. Vascular: No hyperdense vessel identified. Calcific atherosclerosis. Skull: No acute fracture. Sinuses/Orbits: Inferior right maxillary sinus retention cyst. Unremarkable orbits. Other: No mastoid effusions. ASPECTS [facility] Stroke Program Early CT Score) Total score (0-10 with 10 being normal): 10 IMPRESSION: 1. No evidence of acute intracranial abnormality. ASPECTS is 10. 2. 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 Code stroke imaging results were communicated on 08/21/2020 at 8:50 pm to provider Dr. Otelia Limes via secure text paging. Electronically Signed   By: Feliberto Harts MD   On: 08/21/2020 20:52   CT ANGIO HEAD NECK W WO CM (CODE STROKE)  Result Date: 08/21/2020 CLINICAL DATA:  Headache and weakness EXAM: CT ANGIOGRAPHY  HEAD AND NECK TECHNIQUE: Multidetector CT imaging of the head and neck was performed using the standard protocol during bolus administration of intravenous contrast. Multiplanar CT image reconstructions and MIPs were obtained to evaluate the vascular anatomy. Carotid stenosis measurements (when applicable) are obtained utilizing NASCET criteria, using the distal internal carotid diameter as the denominator. CONTRAST:  62mL OMNIPAQUE IOHEXOL 350 MG/ML SOLN COMPARISON:  None. FINDINGS: CTA NECK FINDINGS SKELETON: There is no bony spinal canal stenosis. No lytic or blastic lesion. OTHER NECK: Normal pharynx, larynx and major salivary glands. No cervical lymphadenopathy. Unremarkable thyroid gland. UPPER CHEST: No pneumothorax or pleural effusion. No nodules or masses. AORTIC ARCH: There is calcific atherosclerosis of the aortic arch. There is no aneurysm, dissection or hemodynamically significant stenosis of the visualized portion of the aorta. Conventional 3 vessel aortic branching pattern. The visualized proximal subclavian arteries are widely patent. RIGHT CAROTID SYSTEM: Normal without aneurysm, dissection or stenosis. LEFT CAROTID SYSTEM: No dissection, occlusion or aneurysm. There is mixed density atherosclerosis extending into the proximal ICA, resulting in 60% stenosis. VERTEBRAL ARTERIES: Left dominant configuration. Both origins are clearly patent. There is no dissection, occlusion or flow-limiting stenosis to the skull base (V1-V3 segments). Right vertebral artery terminates proximal to the skull base. CTA HEAD FINDINGS POSTERIOR CIRCULATION: --Vertebral arteries: Normal left V4 segment. Right vertebral artery terminates proximal to the skull base. --Inferior cerebellar arteries: Normal. --Basilar artery: Normal. --Superior cerebellar arteries: Normal. --Posterior cerebral arteries (PCA): Normal. ANTERIOR CIRCULATION: --Intracranial internal carotid arteries: Normal. --Anterior cerebral arteries (ACA):  Normal. Both A1 segments are present. Patent anterior communicating artery (a-comm). --Middle cerebral arteries (MCA): Normal. VENOUS SINUSES: As permitted by contrast timing, patent. ANATOMIC VARIANTS: None Review of the MIP images confirms the above findings. IMPRESSION: 1. No intracranial arterial occlusion or high-grade stenosis. 2. Approximately 60% stenosis of the proximal left internal carotid artery secondary to mixed density atherosclerosis. Aortic Atherosclerosis (ICD10-I70.0). Electronically Signed   By: Chrisandra Netters.D.  On: 08/21/2020 21:14    Assessment: 60 year old male presenting with acute onset of right sided weakness, right facial droop, dysarthria and R > L ataxia 1. Exam reveals findings best localizable to the left thalamus 2. CT head: No evidence of acute intracranial abnormality. ASPECTS is 10. 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 3. CTA of head and neck: No intracranial arterial occlusion or high-grade stenosis. Approximately 60% stenosis of the proximal left internal carotid artery secondary to mixed density atherosclerosis. Aortic Atherosclerosis.  Recommendations: 1. HgbA1c, fasting lipid panel 2. MRI of the brain without contrast 3. PT consult, OT consult, Speech consult 4. Echocardiogram 5. Start 40 mg atorvastatin po QD. Baseline CK level ordered.  6. Prophylactic therapy- ASA 650 mg po x 1 now, or 600 mg per rectum if not able to take PO. Continue home-dose of scheduled ASA. Add Plavix 75 mg po qd. :  7. Risk factor modification 8. Telemetry monitoring 9. Frequent neuro checks 10. NPO until passes stroke swallow screen 11. Carotid ultrasound.  12. Permissive HTN x 24 hours.  13. CIWA protocol.   Electronically signed: Dr. Caryl Pina 08/21/2020, 9:36 PM

## 2020-08-21 NOTE — Code Documentation (Signed)
Stroke Response Nurse Documentation Code Documentation  Corey Salas is a 60 y.o. male arriving to [facility] ED via [facility] on 4/19 with past medical hx of CAD with stents, ETOH and medical noncompliance. Code stroke was activated by EMS. Patient from home where he was LKW at 0300 and now complaining of right sided weakness and right facial droop. On Brilinta (ticagrelor) 90 mg bid. Stroke team at the bedside on patient arrival. Labs drawn and patient cleared for CT by Dr. Lockie Mola. Patient to CT with team. NIHSS 5, see documentation for details and code stroke times. Patient with right facial droop, right leg weakness, right limb ataxia, right decreased sensation and dysarthria  on exam. The following imaging was completed:  CTA head and neck. Patient is not a candidate for tPA due to out of window. Bedside handoff with ED RN [place].    Rose Fillers  Rapid Response RN

## 2020-08-22 ENCOUNTER — Inpatient Hospital Stay (HOSPITAL_COMMUNITY): Payer: Managed Care, Other (non HMO)

## 2020-08-22 ENCOUNTER — Other Ambulatory Visit: Payer: Self-pay | Admitting: *Deleted

## 2020-08-22 DIAGNOSIS — I2510 Atherosclerotic heart disease of native coronary artery without angina pectoris: Secondary | ICD-10-CM

## 2020-08-22 DIAGNOSIS — I10 Essential (primary) hypertension: Secondary | ICD-10-CM

## 2020-08-22 DIAGNOSIS — I639 Cerebral infarction, unspecified: Secondary | ICD-10-CM

## 2020-08-22 DIAGNOSIS — I6522 Occlusion and stenosis of left carotid artery: Secondary | ICD-10-CM

## 2020-08-22 DIAGNOSIS — E782 Mixed hyperlipidemia: Secondary | ICD-10-CM

## 2020-08-22 LAB — HEMOGLOBIN A1C
Hgb A1c MFr Bld: 5.7 % — ABNORMAL HIGH (ref 4.8–5.6)
Mean Plasma Glucose: 116.89 mg/dL

## 2020-08-22 LAB — RAPID URINE DRUG SCREEN, HOSP PERFORMED
Amphetamines: NOT DETECTED
Barbiturates: NOT DETECTED
Benzodiazepines: NOT DETECTED
Cocaine: NOT DETECTED
Opiates: NOT DETECTED
Tetrahydrocannabinol: NOT DETECTED

## 2020-08-22 LAB — LIPID PANEL
Cholesterol: 143 mg/dL (ref 0–200)
HDL: 65 mg/dL (ref >40)
LDL Cholesterol: 45 mg/dL (ref 0–99)
Total CHOL/HDL Ratio: 2.2 RATIO
Triglycerides: 165 mg/dL — ABNORMAL HIGH (ref <150)
VLDL: 33 mg/dL (ref 0–40)

## 2020-08-22 LAB — HIV ANTIBODY (ROUTINE TESTING W REFLEX): HIV Screen 4th Generation wRfx: NONREACTIVE

## 2020-08-22 MED ORDER — ASPIRIN 325 MG PO TABS
325.0000 mg | ORAL_TABLET | Freq: Once | ORAL | Status: AC
  Administered 2020-08-22: 325 mg via ORAL
  Filled 2020-08-22: qty 1

## 2020-08-22 NOTE — Plan of Care (Addendum)
Patient was discharged before I was able to see him today.  I did discuss with Dr. Natale Milch this morning regarding vascular surgery consultation.  Of note, he had been seen by Dr. Lenell Antu and plan for [facility] Friday, which I agree with.  Marvel Plan, MD PhD Stroke Neurology 08/22/2020 4:44 PM

## 2020-08-22 NOTE — Evaluation (Signed)
Physical Therapy Evaluation Patient Details Name: Corey Salas MRN: [id number] DOB: 08-Oct-1960 Today's Date: 08/22/2020   History of Present Illness  Corey Salas is a 60 y.o. male who reported to ED with R sided weakness, facial droop and speech difficulties at 3AM and continued symptoms after awakening at 9AM. MRI confirmed small acute ischemic stroke in L centrum semiovale. Pt admitted on 08/21/20 with acute ischemic stroke. PMH includes PTSD, CAD s/p 6 stents now including a STEMI on 08/06/20 at O[place] (although they wanted to do CABG, they put in another stent then because he kept having cardiac arrest on the cath lab table).    Clinical Impression  Pt received in bed, cooperative and pleasant. Independent for all mobility with HR stable. Pt denies dizziness, weakness, sensation changes, or vision changes. Reports being at baseline level of function and does not have any concerns at this time. Do not anticipate pt will need further PT follow up. Signing off for now. Thank you for the opportunity to participate in this patient's care.    Follow Up Recommendations No PT follow up    Equipment Recommendations  None recommended by PT    Recommendations for Other Services       Precautions / Restrictions Precautions Precautions: Fall Precaution Comments: very low fall Restrictions Weight Bearing Restrictions: No      Mobility  Bed Mobility Overal bed mobility: Independent                  Transfers Overall transfer level: Independent Equipment used: None                Ambulation/Gait Ambulation/Gait assistance: Independent Gait Distance (Feet): 400 Feet Assistive device: None Gait Pattern/deviations: WFL(Within Functional Limits) Gait velocity: WNL   General Gait Details: No LOB  Stairs            Wheelchair Mobility    Modified Rankin (Stroke Patients Only)       Balance Overall balance assessment: Independent                                            Pertinent Vitals/Pain Pain Assessment: No/denies pain    Home Living Family/patient expects to be discharged to:: Private residence Living Arrangements: Spouse/significant other Available Help at Discharge: Family;Available PRN/intermittently Type of Home: House Home Access: Level entry (at back door)     Home Layout: Multi-level Home Equipment: Shower seat - built in;Grab bars - tub/shower;Hand held shower head Additional Comments: Wife works    Prior Function Level of Independence: Independent         Comments: Independent with ADLs, IADLs and mobility without AD. Pt likes to tub soak     Hand Dominance   Dominant Hand: Right    Extremity/Trunk Assessment   Upper Extremity Assessment Upper Extremity Assessment: Defer to OT evaluation    Lower Extremity Assessment Lower Extremity Assessment: Overall WFL for tasks assessed    Cervical / Trunk Assessment Cervical / Trunk Assessment: Normal  Communication   Communication: No difficulties  Cognition Arousal/Alertness: Awake/alert Behavior During Therapy: WFL for tasks assessed/performed;Restless Overall Cognitive Status: Within Functional Limits for tasks assessed                                 General Comments: A&OX4, pleasant and participatory. a  bit anxious and questioning when he can leave, reports unaware of CVA (wife present and reports he was asleep when she spoke to MD).      General Comments General comments (skin integrity, edema, etc.): HR in high 70s following ambulation.    Exercises     Assessment/Plan    PT Assessment Patent does not need any further PT services  PT Problem List         PT Treatment Interventions      PT Goals (Current goals can be found in the Care Plan section)  Acute Rehab PT Goals Patient Stated Goal: go home by tomorrow PT Goal Formulation: With patient Time For Goal Achievement: 09/05/20 Potential to Achieve  Goals: Good    Frequency     Barriers to discharge        Co-evaluation               AM-PAC PT "6 Clicks" Mobility  Outcome Measure Help needed turning from your back to your side while in a flat bed without using bedrails?: None Help needed moving from lying on your back to sitting on the side of a flat bed without using bedrails?: None Help needed moving to and from a bed to a chair (including a wheelchair)?: None Help needed standing up from a chair using your arms (e.g., wheelchair or bedside chair)?: None Help needed to walk in hospital room?: None Help needed climbing 3-5 steps with a railing? : None 6 Click Score: 24    End of Session Equipment Utilized During Treatment: Gait belt Activity Tolerance: Patient tolerated treatment well Patient left: in chair;with call bell/phone within reach;with family/visitor present Nurse Communication: Mobility status PT Visit Diagnosis: Other abnormalities of gait and mobility (R26.89)    Time:  -      Charges:             Conley Rolls, SPT

## 2020-08-22 NOTE — Discharge Summary (Signed)
Physician Discharge Summary  Nilan Iddings IFO:[id number] DOB: Aug 22, 1960 DOA: 08/21/2020  PCP: Pcp, No  Admit date: 08/21/2020 Discharge date: 08/22/2020  Admitted From: Home Disposition: Home  Recommendations for Outpatient Follow-up:  1. Follow up with PCP in 1-2 weeks 2. Please obtain BMP/CBC in one week 3. Please follow up with vascular surgery as scheduled on Friday  Home Health: None Equipment/Devices: Not indicated  Discharge Condition: Stable CODE STATUS: Full Diet recommendation: Low-salt low-fat diet  Brief/Interim Summary: Scherrie November a 60 y.o.malewith medical history significant ofPTSD, CAD s/p 6 stents now including a STEMI just earlier this month at O[place] (although they wanted to do CABG, they put in another stent then because he kept having cardiac arrest on the cath lab table). Despite events of 08/06/20, pt recovered rather quickly and was actually discharged on 08/07/20, pt on ASA and Brilinta. At ~3am pt was awake still and noted onset of R sided weakness. Went to bed, woke in AM, had persistent R sided weakness, R facial droop, dysarthria with slurred speech, and R>L ataxia. Wife was eventually able to convince him to come in to ED this evening. Though he is well out of time window for TPA.  Patient mated for stroke work-up, MRI brain confirms small acute ischemic stroke in L centrum semiovale. CTA head and neck: no LVO but has a 60% L carotid stenosis.  Patient evaluated by vascular surgery, plan for further evaluation and treatment in the outpatient setting later this week.  Patient's symptoms otherwise resolved, stable and agreeable for discharge home, continue statin Brilinta and aspirin as previously prescribed.  Discharge Diagnoses:  Principal Problem:   Acute ischemic stroke [facility]) Active Problems:   CAD (coronary artery disease)   Primary hypertension   Mixed hyperlipidemia    Discharge Instructions  Discharge Instructions    Diet - low sodium  heart healthy   Complete by: As directed    Increase activity slowly   Complete by: As directed      Allergies as of 08/22/2020   No Known Allergies     Medication List    TAKE these medications   aspirin EC 81 MG tablet Take 1 tablet (81 mg total) by mouth daily. Swallow whole.   Brilinta 90 MG Tabs tablet Generic drug: ticagrelor Take 90 mg by mouth 2 (two) times daily.   carvedilol 12.5 MG tablet Commonly known as: COREG Take 12.5 mg by mouth 2 (two) times daily.   Centrum Silver 50+Men Tabs Take 1 tablet by mouth daily.   nitroGLYCERIN 0.4 MG SL tablet Commonly known as: NITROSTAT Place 0.4 mg under the tongue every 5 (five) minutes as needed for chest pain.   nitroGLYCERIN 0.4 MG/SPRAY spray Commonly known as: NITROLINGUAL Place 1 spray under the tongue daily as needed for chest pain.   rosuvastatin 20 MG tablet Commonly known as: CRESTOR Take 20 mg by mouth daily.   sildenafil 20 MG tablet Commonly known as: REVATIO Take 20 mg by mouth daily as needed (ed).   vitamin B-12 100 MCG tablet Commonly known as: CYANOCOBALAMIN Take 100 mcg by mouth daily.       No Known Allergies  Consultations: Neurology, vascular surgery  Procedures/Studies: MR BRAIN WO CONTRAST  Result Date: 08/21/2020 CLINICAL DATA:  Acute neurologic deficit EXAM: MRI HEAD WITHOUT CONTRAST TECHNIQUE: Multiplanar, multiecho pulse sequences of the brain and surrounding structures were obtained without intravenous contrast. COMPARISON:  None. FINDINGS: Brain: Small focus of abnormal diffusion restriction within the left centrum semiovale.  No acute or chronic hemorrhage. There is multifocal hyperintense T2-weighted signal within the white matter. Generalized volume loss without a clear lobar predilection. The midline structures are normal. Vascular: Major flow voids are preserved. Skull and upper cervical spine: Normal calvarium and skull base. Visualized upper cervical spine and soft tissues  are normal. Sinuses/Orbits:No paranasal sinus fluid levels or advanced mucosal thickening. No mastoid or middle ear effusion. Normal orbits. IMPRESSION: 1. Small focus of acute/early subacute ischemia within the left centrum semiovale. No hemorrhage or mass effect. 2. Findings of chronic microvascular ischemia and generalized volume loss. Electronically Signed   By: Deatra Robinson M.D.   On: 08/21/2020 23:11   CT HEAD CODE STROKE WO CONTRAST  Result Date: 08/21/2020 CLINICAL DATA:  Code stroke.  Neuro deficit, acute stroke suspected. EXAM: CT HEAD WITHOUT CONTRAST TECHNIQUE: Contiguous axial images were obtained from the base of the skull through the vertex without intravenous contrast. COMPARISON:  None. FINDINGS: Brain: No evidence of acute large vascular territory infarction, hemorrhage, hydrocephalus, extra-axial collection or mass lesion/mass effect. Patchy white matter hypoattenuation, most likely related to chronic microvascular ischemic disease. Vascular: No hyperdense vessel identified. Calcific atherosclerosis. Skull: No acute fracture. Sinuses/Orbits: Inferior right maxillary sinus retention cyst. Unremarkable orbits. Other: No mastoid effusions. ASPECTS [facility] Stroke Program Early CT Score) Total score (0-10 with 10 being normal): 10 IMPRESSION: 1. No evidence of acute intracranial abnormality. ASPECTS is 10. 2. 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 Code stroke imaging results were communicated on 08/21/2020 at 8:50 pm to provider Dr. Otelia Limes via secure text paging. Electronically Signed   By: Feliberto Harts MD   On: 08/21/2020 20:52   VAS US CAROTID (at [facility] and [facility] only)  Result Date: 08/22/2020 Carotid Arterial Duplex Study Indications:       CVA. Risk Factors:      Coronary artery disease. Comparison Study:  no prior Performing Technologist: Blanch Media RVS  Examination Guidelines: A complete evaluation includes B-mode imaging, spectral Doppler, color Doppler, and power Doppler as  needed of all accessible portions of each vessel. Bilateral testing is considered an integral part of a complete examination. Limited examinations for reoccurring indications may be performed as noted.  Right Carotid Findings: +----------+--------+--------+--------+------------------+--------+           PSV cm/sEDV cm/sStenosisPlaque DescriptionComments +----------+--------+--------+--------+------------------+--------+ CCA Prox  63      17              heterogenous               +----------+--------+--------+--------+------------------+--------+ CCA Distal68      19              heterogenous               +----------+--------+--------+--------+------------------+--------+ ICA Prox  60      20      1-39%   heterogenous               +----------+--------+--------+--------+------------------+--------+ ICA Distal137     37                                         +----------+--------+--------+--------+------------------+--------+ ECA       112     26                                         +----------+--------+--------+--------+------------------+--------+ +----------+--------+-------+--------+-------------------+  PSV cm/sEDV cmsDescribeArm Pressure (mmHG) +----------+--------+-------+--------+-------------------+ Subclavian100                                        +----------+--------+-------+--------+-------------------+ +---------+--------+--+--------+--+---------+ VertebralPSV cm/s69EDV cm/s21Antegrade +---------+--------+--+--------+--+---------+  Left Carotid Findings: +----------+--------+--------+--------+------------------+--------+           PSV cm/sEDV cm/sStenosisPlaque DescriptionComments +----------+--------+--------+--------+------------------+--------+ CCA Prox  77      18              heterogenous               +----------+--------+--------+--------+------------------+--------+ CCA Distal73      17               heterogenous               +----------+--------+--------+--------+------------------+--------+ ICA Prox  215     63      60-79%  heterogenous               +----------+--------+--------+--------+------------------+--------+ ICA Mid   122     33                                         +----------+--------+--------+--------+------------------+--------+ ICA Distal99      32                                         +----------+--------+--------+--------+------------------+--------+ ECA       63      13                                         +----------+--------+--------+--------+------------------+--------+ +----------+--------+--------+--------+-------------------+           PSV cm/sEDV cm/sDescribeArm Pressure (mmHG) +----------+--------+--------+--------+-------------------+ Subclavian113                                         +----------+--------+--------+--------+-------------------+ +---------+--------+--+--------+--+---------+ VertebralPSV cm/s85EDV cm/s29Antegrade +---------+--------+--+--------+--+---------+   Summary: Right Carotid: Velocities in the right ICA are consistent with a 1-39% stenosis. Left Carotid: Velocities in the left ICA are consistent with a 60-79% stenosis. Vertebrals: Bilateral vertebral arteries demonstrate antegrade flow. *See table(s) above for measurements and observations.     Preliminary    CT ANGIO HEAD NECK W WO CM (CODE STROKE)  Result Date: 08/21/2020 CLINICAL DATA:  Headache and weakness EXAM: CT ANGIOGRAPHY HEAD AND NECK TECHNIQUE: Multidetector CT imaging of the head and neck was performed using the standard protocol during bolus administration of intravenous contrast. Multiplanar CT image reconstructions and MIPs were obtained to evaluate the vascular anatomy. Carotid stenosis measurements (when applicable) are obtained utilizing NASCET criteria, using the distal internal carotid diameter as the denominator. CONTRAST:   75mL OMNIPAQUE IOHEXOL 350 MG/ML SOLN COMPARISON:  None. FINDINGS: CTA NECK FINDINGS SKELETON: There is no bony spinal canal stenosis. No lytic or blastic lesion. OTHER NECK: Normal pharynx, larynx and major salivary glands. No cervical lymphadenopathy. Unremarkable thyroid gland. UPPER CHEST: No pneumothorax or pleural effusion. No nodules or masses. AORTIC ARCH: There is calcific atherosclerosis of the aortic arch. There is no aneurysm, dissection or  hemodynamically significant stenosis of the visualized portion of the aorta. Conventional 3 vessel aortic branching pattern. The visualized proximal subclavian arteries are widely patent. RIGHT CAROTID SYSTEM: Normal without aneurysm, dissection or stenosis. LEFT CAROTID SYSTEM: No dissection, occlusion or aneurysm. There is mixed density atherosclerosis extending into the proximal ICA, resulting in 60% stenosis. VERTEBRAL ARTERIES: Left dominant configuration. Both origins are clearly patent. There is no dissection, occlusion or flow-limiting stenosis to the skull base (V1-V3 segments). Right vertebral artery terminates proximal to the skull base. CTA HEAD FINDINGS POSTERIOR CIRCULATION: --Vertebral arteries: Normal left V4 segment. Right vertebral artery terminates proximal to the skull base. --Inferior cerebellar arteries: Normal. --Basilar artery: Normal. --Superior cerebellar arteries: Normal. --Posterior cerebral arteries (PCA): Normal. ANTERIOR CIRCULATION: --Intracranial internal carotid arteries: Normal. --Anterior cerebral arteries (ACA): Normal. Both A1 segments are present. Patent anterior communicating artery (a-comm). --Middle cerebral arteries (MCA): Normal. VENOUS SINUSES: As permitted by contrast timing, patent. ANATOMIC VARIANTS: None Review of the MIP images confirms the above findings. IMPRESSION: 1. No intracranial arterial occlusion or high-grade stenosis. 2. Approximately 60% stenosis of the proximal left internal carotid artery secondary to  mixed density atherosclerosis. Aortic Atherosclerosis (ICD10-I70.0). Electronically Signed   By: Deatra Robinson M.D.   On: 08/21/2020 21:14      Subjective: No acute issues or events overnight, symptoms resolved, requesting discharge home   Discharge Exam: Vitals:   08/22/20 0848 08/22/20 1220  BP: (!) 183/102 (!) 155/93  Pulse: 77 88  Resp: 18 16  Temp: 98.2 F (36.8 C) 98.2 F (36.8 C)  SpO2: 98% 90%   Vitals:   08/22/20 0416 08/22/20 0611 08/22/20 0848 08/22/20 1220  BP: (!) 157/88 (!) 141/100 (!) 183/102 (!) 155/93  Pulse: 82 80 77 88  Resp: Temp: 98.3 F (36.8 C) 97.8 F (36.6 C) 98.2 F (36.8 C) 98.2 F (36.8 C)  TempSrc: Oral Oral Oral Oral  SpO2: 100% 98% 98% 90%  Weight:      Height:        General: Pt is alert, awake, not in acute distress Cardiovascular: RRR, S1/S2 +, no rubs, no gallops Respiratory: CTA bilaterally, no wheezing, no rhonchi Abdominal: Soft, NT, ND, bowel sounds + Extremities: no edema, no cyanosis   The results of significant diagnostics from this hospitalization (including imaging, microbiology, ancillary and laboratory) are listed below for reference.     Microbiology: Recent Results (from the past 240 hour(s))  Resp Panel by RT-PCR (Flu A&B, Covid) Nasopharyngeal Swab     Status: None   Collection Time: 08/21/20  9:52 PM   Specimen: Nasopharyngeal Swab; Nasopharyngeal(NP) swabs in vial transport medium  Result Value Ref Range Status   SARS Coronavirus 2 by RT PCR NEGATIVE NEGATIVE Final    Comment: (NOTE) SARS-CoV-2 target nucleic acids are NOT DETECTED.  The SARS-CoV-2 RNA is generally detectable in upper respiratory specimens during the acute phase of infection. The lowest concentration of SARS-CoV-2 viral copies this assay can detect is 138 copies/mL. A negative result does not preclude SARS-Cov-2 infection and should not be used as the sole basis for treatment or other patient management decisions. A negative  result may occur with  improper specimen collection/handling, submission of specimen other than nasopharyngeal swab, presence of viral mutation(s) within the areas targeted by this assay, and inadequate number of viral copies(<138 copies/mL). A negative result must be combined with clinical observations, patient history, and epidemiological information. The expected result is Negative.  Fact Sheet for Patients:  BloggerCourse.com  Fact Sheet for Healthcare Providers:  SeriousBroker.it  This test is no t yet approved or cleared by the [place] FDA and  has been authorized for detection and/or diagnosis of SARS-CoV-2 by FDA under an Emergency Use Authorization (EUA). This EUA will remain  in effect (meaning this test can be used) for the duration of the COVID-19 declaration under Section 564(b)(1) of the Act, 21 U.S.C.section 360bbb-3(b)(1), unless the authorization is terminated  or revoked sooner.       Influenza A by PCR NEGATIVE NEGATIVE Final   Influenza B by PCR NEGATIVE NEGATIVE Final    Comment: (NOTE) The Xpert Xpress SARS-CoV-2/FLU/RSV plus assay is intended as an aid in the diagnosis of influenza from Nasopharyngeal swab specimens and should not be used as a sole basis for treatment. Nasal washings and aspirates are unacceptable for Xpert Xpress SARS-CoV-2/FLU/RSV testing.  Fact Sheet for Patients: BloggerCourse.com  Fact Sheet for Healthcare Providers: SeriousBroker.it  This test is not yet approved or cleared by the [place] FDA and has been authorized for detection and/or diagnosis of SARS-CoV-2 by FDA under an Emergency Use Authorization (EUA). This EUA will remain in effect (meaning this test can be used) for the duration of the COVID-19 declaration under Section 564(b)(1) of the Act, 21 U.S.C. section 360bbb-3(b)(1), unless the authorization is  terminated or revoked.  Performed at [facility], [address], [place], [place] [address]      Labs: BNP (last 3 results) No results for input(s): BNP in the last 8760 hours. Basic Metabolic Panel: Recent Labs  Lab 08/21/20 2033 08/21/20 2040  NA 140 141  K 3.8 3.8  CL 106 108  CO2 22  --   GLUCOSE 78 78  BUN 7 7  CREATININE 0.92 1.00  CALCIUM 10.2  --    Liver Function Tests: Recent Labs  Lab 08/21/20 2033  AST 34  ALT 38  ALKPHOS 58  BILITOT 0.7  PROT 7.3  ALBUMIN 4.2   No results for input(s): LIPASE, AMYLASE in the last 168 hours. No results for input(s): AMMONIA in the last 168 hours. CBC: Recent Labs  Lab 08/21/20 2033 08/21/20 2040  WBC 10.8*  --   NEUTROABS 5.3  --   HGB 18.1* 18.0*  HCT 53.3* 53.0*  MCV 102.5*  --   PLT 274  --    Cardiac Enzymes: Recent Labs  Lab 08/21/20 2202  CKTOTAL 216   BNP: Invalid input(s): POCBNP CBG: Recent Labs  Lab 08/21/20 2145  GLUCAP 85   D-Dimer No results for input(s): DDIMER in the last 72 hours. Hgb A1c Recent Labs    08/22/20 0454  HGBA1C 5.7*   Lipid Profile Recent Labs    08/22/20 0454  CHOL 143  HDL 65  LDLCALC 45  TRIG 165*  CHOLHDL 2.2   Thyroid function studies No results for input(s): TSH, T4TOTAL, T3FREE, THYROIDAB in the last 72 hours.  Invalid input(s): FREET3 Anemia work up No results for input(s): VITAMINB12, FOLATE, FERRITIN, TIBC, IRON, RETICCTPCT in the last 72 hours. Urinalysis No results found for: COLORURINE, APPEARANCEUR, LABSPEC, PHURINE, GLUCOSEU, HGBUR, BILIRUBINUR, KETONESUR, PROTEINUR, UROBILINOGEN, NITRITE, LEUKOCYTESUR Sepsis Labs Invalid input(s): PROCALCITONIN,  WBC,  LACTICIDVEN Microbiology Recent Results (from the past 240 hour(s))  Resp Panel by RT-PCR (Flu A&B, Covid) Nasopharyngeal Swab     Status: None   Collection Time: 08/21/20  9:52 PM   Specimen: Nasopharyngeal Swab; Nasopharyngeal(NP) swabs in vial transport medium  Result  Value Ref Range Status  SARS Coronavirus 2 by RT PCR NEGATIVE NEGATIVE Final    Comment: (NOTE) SARS-CoV-2 target nucleic acids are NOT DETECTED.  The SARS-CoV-2 RNA is generally detectable in upper respiratory specimens during the acute phase of infection. The lowest concentration of SARS-CoV-2 viral copies this assay can detect is 138 copies/mL. A negative result does not preclude SARS-Cov-2 infection and should not be used as the sole basis for treatment or other patient management decisions. A negative result may occur with  improper specimen collection/handling, submission of specimen other than nasopharyngeal swab, presence of viral mutation(s) within the areas targeted by this assay, and inadequate number of viral copies(<138 copies/mL). A negative result must be combined with clinical observations, patient history, and epidemiological information. The expected result is Negative.  Fact Sheet for Patients:  BloggerCourse.comhttps://www.fda.gov/media/152166/download  Fact Sheet for Healthcare Providers:  SeriousBroker.ithttps://www.fda.gov/media/152162/download  This test is no t yet approved or cleared by the [place] FDA and  has been authorized for detection and/or diagnosis of SARS-CoV-2 by FDA under an Emergency Use Authorization (EUA). This EUA will remain  in effect (meaning this test can be used) for the duration of the COVID-19 declaration under Section 564(b)(1) of the Act, 21 U.S.C.section 360bbb-3(b)(1), unless the authorization is terminated  or revoked sooner.       Influenza A by PCR NEGATIVE NEGATIVE Final   Influenza B by PCR NEGATIVE NEGATIVE Final    Comment: (NOTE) The Xpert Xpress SARS-CoV-2/FLU/RSV plus assay is intended as an aid in the diagnosis of influenza from Nasopharyngeal swab specimens and should not be used as a sole basis for treatment. Nasal washings and aspirates are unacceptable for Xpert Xpress SARS-CoV-2/FLU/RSV testing.  Fact Sheet for  Patients: BloggerCourse.comhttps://www.fda.gov/media/152166/download  Fact Sheet for Healthcare Providers: SeriousBroker.ithttps://www.fda.gov/media/152162/download  This test is not yet approved or cleared by the [place] FDA and has been authorized for detection and/or diagnosis of SARS-CoV-2 by FDA under an Emergency Use Authorization (EUA). This EUA will remain in effect (meaning this test can be used) for the duration of the COVID-19 declaration under Section 564(b)(1) of the Act, 21 U.S.C. section 360bbb-3(b)(1), unless the authorization is terminated or revoked.  Performed at [facility], [address] [place], [place] [address]      Time coordinating discharge: Over 30 minutes  SIGNED:   Azucena FallenWilliam C Nicoles Sedlacek, DO [facility] 08/22/2020, 12:33 PM Pager   If 7PM-7AM, please contact night-coverage [email or web address]

## 2020-08-22 NOTE — Progress Notes (Signed)
Patient admitted to 34c13. No s/s of distress. Denies pain or discomfort. Oriented to call bell. Bed low call bell within reach.

## 2020-08-22 NOTE — H&P (Signed)
History and Physical    Corey Salas HDQ:[id number] DOB: 1960-05-27 DOA: 08/21/2020  PCP: Pcp, No  Patient coming from: Home  I have personally briefly reviewed patient's old medical records in [facility]  Chief Complaint: Stroke  HPI: Corey Salas is a 60 y.o. male with medical history significant of PTSD, CAD s/p 6 stents now including a STEMI just earlier this month at OSH [place] (although they wanted to do CABG, they put in another stent then because he kept having cardiac arrest on the cath lab table).  Despite events of 08/06/20, pt recovered rather quickly and was actually discharged on 08/07/20, pt on ASA and Brilinta.  At ~3am pt was awake still and noted onset of R sided weakness.  Went to bed, woke in AM, had persistent R sided weakness, R facial droop, dysarthria with slurred speech, and R>L ataxia.  Wife was eventually able to convince him to come in to ED this evening.  Though he is well out of time window for TPA.  No CP, no SOB.   ED Course: MRI brain confirms small acute ischemic stroke in L centrum semiovale.  CTA head and neck: no LVO but has a 60% L carotid stenosis.   Review of Systems: As per HPI, otherwise all review of systems negative.  Past Medical History:  Diagnosis Date  . CAD (coronary artery disease)   . Chest pain   . PTSD (post-traumatic stress disorder)   . STEMI (ST elevation myocardial infarction) (HCC) 08/06/2020    Past Surgical History:  Procedure Laterality Date  . APPENDECTOMY    . CORONARY ANGIOPLASTY WITH STENT PLACEMENT     6X     reports that he has been smoking. He has never used smokeless tobacco. He reports current alcohol use of about 7.0 standard drinks of alcohol per week. No history on file for drug use.  No Known Allergies  Family History  Problem Relation Age of Onset  . Heart attack Mother   . Heart attack Father      Prior to Admission medications   Medication Sig Start Date End Date Taking? Authorizing  Provider  aspirin EC 81 MG tablet Take 1 tablet (81 mg total) by mouth daily. Swallow whole. 07/02/20  Yes Tobb, Kardie, DO  BRILINTA 90 MG TABS tablet Take 90 mg by mouth 2 (two) times daily. 08/07/20  Yes [provider]  carvedilol (COREG) 12.5 MG tablet Take 12.5 mg by mouth 2 (two) times daily. 08/07/20  Yes [provider]  Multiple Vitamins-Minerals (CENTRUM SILVER 50+MEN) TABS Take 1 tablet by mouth daily.   Yes [provider]  nitroGLYCERIN (NITROLINGUAL) 0.4 MG/SPRAY spray Place 1 spray under the tongue daily as needed for chest pain. 06/29/20  Yes [provider]  nitroGLYCERIN (NITROSTAT) 0.4 MG SL tablet Place 0.4 mg under the tongue every 5 (five) minutes as needed for chest pain.   Yes [provider]  rosuvastatin (CRESTOR) 20 MG tablet Take 20 mg by mouth daily. 08/07/20  Yes [provider]  sildenafil (REVATIO) 20 MG tablet Take 20 mg by mouth daily as needed (ed).   Yes [provider]  vitamin B-12 (CYANOCOBALAMIN) 100 MCG tablet Take 100 mcg by mouth daily.   Yes [provider]    Physical Exam: Vitals:   08/21/20 2215 08/21/20 2352 08/22/20 0152 08/22/20 0416  BP: 126/88 107/75 105/84 (!) 157/88  Pulse: 81 81 75 82  Resp: 15 18 18 19   Temp:  97.7 F (36.5 C) 97.6 F (36.4 C) 98.3 F (36.8 C)  TempSrc:  Axillary Oral Oral  SpO2: 94% 94% 94% 100%  Weight:      Height:        Constitutional: NAD, calm, comfortable Eyes: PERRL, lids and conjunctivae normal ENMT: Mucous membranes are moist. Posterior pharynx clear of any exudate or lesions.Normal dentition.  Neck: normal, supple, no masses, no thyromegaly Respiratory: clear to auscultation bilaterally, no wheezing, no crackles. Normal respiratory effort. No accessory muscle use.  Cardiovascular: Regular rate and rhythm, no murmurs / rubs / gallops. No extremity edema. 2+ pedal pulses. No carotid bruits.  Abdomen: no tenderness, no masses palpated. No  hepatosplenomegaly. Bowel sounds positive.  Musculoskeletal: no clubbing / cyanosis. No joint deformity upper and lower extremities. Good ROM, no contractures. Normal muscle tone.  Skin: no rashes, lesions, ulcers. No induration Neurologic: CN 2-12 grossly intact. Sensation intact, DTR normal. Strength 5/5 in all 4.  Psychiatric: Normal judgment and insight. Alert and oriented x 3. Normal mood.    Labs on Admission: I have personally reviewed following labs and imaging studies  CBC: Recent Labs  Lab 08/21/20 2033 08/21/20 2040  WBC 10.8*  --   NEUTROABS 5.3  --   HGB 18.1* 18.0*  HCT 53.3* 53.0*  MCV 102.5*  --   PLT 274  --    Basic Metabolic Panel: Recent Labs  Lab 08/21/20 2033 08/21/20 2040  NA 140 141  K 3.8 3.8  CL 106 108  CO2 22  --   GLUCOSE 78 78  BUN 7 7  CREATININE 0.92 1.00  CALCIUM 10.2  --    GFR: Estimated Creatinine Clearance: 83.7 mL/min (by C-G formula based on SCr of 1 mg/dL). Liver Function Tests: Recent Labs  Lab 08/21/20 2033  AST 34  ALT 38  ALKPHOS 58  BILITOT 0.7  PROT 7.3  ALBUMIN 4.2   No results for input(s): LIPASE, AMYLASE in the last 168 hours. No results for input(s): AMMONIA in the last 168 hours. Coagulation Profile: Recent Labs  Lab 08/21/20 2033  INR 1.0   Cardiac Enzymes: Recent Labs  Lab 08/21/20 2202  CKTOTAL 216   BNP (last 3 results) No results for input(s): PROBNP in the last 8760 hours. HbA1C: Recent Labs    08/22/20 0454  HGBA1C 5.7*   CBG: Recent Labs  Lab 08/21/20 2145  GLUCAP 85   Lipid Profile: Recent Labs    08/22/20 0454  CHOL 143  HDL 65  LDLCALC 45  TRIG 165*  CHOLHDL 2.2   Thyroid Function Tests: No results for input(s): TSH, T4TOTAL, FREET4, T3FREE, THYROIDAB in the last 72 hours. Anemia Panel: No results for input(s): VITAMINB12, FOLATE, FERRITIN, TIBC, IRON, RETICCTPCT in the last 72 hours. Urine analysis: No results found for: COLORURINE, APPEARANCEUR, LABSPEC, PHURINE,  GLUCOSEU, HGBUR, BILIRUBINUR, KETONESUR, PROTEINUR, UROBILINOGEN, NITRITE, LEUKOCYTESUR  Radiological Exams on Admission: MR BRAIN WO CONTRAST  Result Date: 08/21/2020 CLINICAL DATA:  Acute neurologic deficit EXAM: MRI HEAD WITHOUT CONTRAST TECHNIQUE: Multiplanar, multiecho pulse sequences of the brain and surrounding structures were obtained without intravenous contrast. COMPARISON:  None. FINDINGS: Brain: Small focus of abnormal diffusion restriction within the left centrum semiovale. No acute or chronic hemorrhage. There is multifocal hyperintense T2-weighted signal within the white matter. Generalized volume loss without a clear lobar predilection. The midline structures are normal. Vascular: Major flow voids are preserved. Skull and upper cervical spine: Normal calvarium and skull base. Visualized upper cervical spine and soft tissues  are normal. Sinuses/Orbits:No paranasal sinus fluid levels or advanced mucosal thickening. No mastoid or middle ear effusion. Normal orbits. IMPRESSION: 1. Small focus of acute/early subacute ischemia within the left centrum semiovale. No hemorrhage or mass effect. 2. Findings of chronic microvascular ischemia and generalized volume loss. Electronically Signed   By: Deatra RobinsonKevin  Herman M.D.   On: 08/21/2020 23:11   CT HEAD CODE STROKE WO CONTRAST  Result Date: 08/21/2020 CLINICAL DATA:  Code stroke.  Neuro deficit, acute stroke suspected. EXAM: CT HEAD WITHOUT CONTRAST TECHNIQUE: Contiguous axial images were obtained from the base of the skull through the vertex without intravenous contrast. COMPARISON:  None. FINDINGS: Brain: No evidence of acute large vascular territory infarction, hemorrhage, hydrocephalus, extra-axial collection or mass lesion/mass effect. Patchy white matter hypoattenuation, most likely related to chronic microvascular ischemic disease. Vascular: No hyperdense vessel identified. Calcific atherosclerosis. Skull: No acute fracture. Sinuses/Orbits: Inferior  right maxillary sinus retention cyst. Unremarkable orbits. Other: No mastoid effusions. ASPECTS [facility]([place] Stroke Program Early CT Score) Total score (0-10 with 10 being normal): 10 IMPRESSION: 1. No evidence of acute intracranial abnormality. ASPECTS is 10. 2. 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 Code stroke imaging results were communicated on 08/21/2020 at 8:50 pm to provider Dr. Otelia LimesLindzen via secure text paging. Electronically Signed   By: Feliberto HartsFrederick S Jones MD   On: 08/21/2020 20:52   CT ANGIO HEAD NECK W WO CM (CODE STROKE)  Result Date: 08/21/2020 CLINICAL DATA:  Headache and weakness EXAM: CT ANGIOGRAPHY HEAD AND NECK TECHNIQUE: Multidetector CT imaging of the head and neck was performed using the standard protocol during bolus administration of intravenous contrast. Multiplanar CT image reconstructions and MIPs were obtained to evaluate the vascular anatomy. Carotid stenosis measurements (when applicable) are obtained utilizing NASCET criteria, using the distal internal carotid diameter as the denominator. CONTRAST:  75mL OMNIPAQUE IOHEXOL 350 MG/ML SOLN COMPARISON:  None. FINDINGS: CTA NECK FINDINGS SKELETON: There is no bony spinal canal stenosis. No lytic or blastic lesion. OTHER NECK: Normal pharynx, larynx and major salivary glands. No cervical lymphadenopathy. Unremarkable thyroid gland. UPPER CHEST: No pneumothorax or pleural effusion. No nodules or masses. AORTIC ARCH: There is calcific atherosclerosis of the aortic arch. There is no aneurysm, dissection or hemodynamically significant stenosis of the visualized portion of the aorta. Conventional 3 vessel aortic branching pattern. The visualized proximal subclavian arteries are widely patent. RIGHT CAROTID SYSTEM: Normal without aneurysm, dissection or stenosis. LEFT CAROTID SYSTEM: No dissection, occlusion or aneurysm. There is mixed density atherosclerosis extending into the proximal ICA, resulting in 60% stenosis. VERTEBRAL ARTERIES:  Left dominant configuration. Both origins are clearly patent. There is no dissection, occlusion or flow-limiting stenosis to the skull base (V1-V3 segments). Right vertebral artery terminates proximal to the skull base. CTA HEAD FINDINGS POSTERIOR CIRCULATION: --Vertebral arteries: Normal left V4 segment. Right vertebral artery terminates proximal to the skull base. --Inferior cerebellar arteries: Normal. --Basilar artery: Normal. --Superior cerebellar arteries: Normal. --Posterior cerebral arteries (PCA): Normal. ANTERIOR CIRCULATION: --Intracranial internal carotid arteries: Normal. --Anterior cerebral arteries (ACA): Normal. Both A1 segments are present. Patent anterior communicating artery (a-comm). --Middle cerebral arteries (MCA): Normal. VENOUS SINUSES: As permitted by contrast timing, patent. ANATOMIC VARIANTS: None Review of the MIP images confirms the above findings. IMPRESSION: 1. No intracranial arterial occlusion or high-grade stenosis. 2. Approximately 60% stenosis of the proximal left internal carotid artery secondary to mixed density atherosclerosis. Aortic Atherosclerosis (ICD10-I70.0). Electronically Signed   By: Deatra RobinsonKevin  Herman M.D.   On: 08/21/2020 21:14    EKG: Independently reviewed.  Assessment/Plan Principal Problem:   Acute ischemic stroke [facility]) Active Problems:   CAD (coronary artery disease)   Primary hypertension   Mixed hyperlipidemia    1. Acute ischemic stroke - 1. Stroke pathway 2. Neuro consult 3. 2d echo 4. Carotid dopplers to confirm L carotid stenosis 5. Stroke team to see today 6. Consider CEA vs other treatment of carotid stenosis 7. Leaving pt on ASA 81 and Brilinta for the moment.  (not starting plavix because patient already on Brilinta). 8. PT/OT/SLP 2. CAD - 1. Got 6th stent after having STEMI (with cardiac arrests) earlier this month at [place] hospital. 2. Apparently recovered rapidly despite initial presentation and was actually discharged the next  day. 3. Cont ASA + brilinta 4. Cont statin 5. 2d echo 6. Definitely will need cardiac clearance by cards before attempting CEA if needed. 3. HTN - 1. Holding coreg for the moment cautiously (as pt just had STEMI 16 days ago) and allowing permissive HTN. 4. HLD - 1. Now that pt taking Crestor (following STEMI earlier this month) looks like FLP is pretty good.  HDL 65 and LDL 45 today. 2. Cont crestor  DVT prophylaxis: Lovenox Code Status: Full Family Communication: Wife at bedside Disposition Plan: TBD Consults called: Neurology, Dr. Otelia Limes Admission status: Admit to inpatient  Severity of Illness: The appropriate patient status for this patient is INPATIENT. Inpatient status is judged to be reasonable and necessary in order to provide the required intensity of service to ensure the patient's safety. The patient's presenting symptoms, physical exam findings, and initial radiographic and laboratory data in the context of their chronic comorbidities is felt to place them at high risk for further clinical deterioration. Furthermore, it is not anticipated that the patient will be medically stable for discharge from the hospital within 2 midnights of admission. The following factors support the patient status of inpatient.   IP status for work up of acute ischemic stroke.   * I certify that at the point of admission it is my clinical judgment that the patient will require inpatient hospital care spanning beyond 2 midnights from the point of admission due to high intensity of service, high risk for further deterioration and high frequency of surveillance required.*    Kyllie Pettijohn M. DO [facility]  How to contact the [facility] Attending or Consulting provider 7A - 7P or covering provider during after hours 7P -7A, for this patient?  1. Check the care team in [facility] and look for a) attending/consulting [facility] provider listed and b) the [facility] team listed 2. Log into [email or web address]  Amion  Physician Scheduling and messaging for groups and whole hospitals  On call and physician scheduling software for group practices, residents, hospitalists and other medical providers for call, clinic, rotation and shift schedules. On[employer] is a hospital-wide system for scheduling doctors and paging doctors on call. EasyPlot is for scientific plotting and data analysis.  [email or web address]  and use [facility]'s universal password to access. If you do not have the password, please contact the hospital operator.  3. Locate the [facility] provider you are looking for under [facility] and page to a number that you can be directly reached. 4. If you still have difficulty reaching the provider, please page the [facility] (Director on Call) for the Hospitalists listed on amion for assistance.  08/22/2020, 5:36 AM

## 2020-08-22 NOTE — Evaluation (Signed)
Occupational Therapy Evaluation/Discharge Patient Details Name: Corey Salas MRN: [id number] DOB: [birth year] Today's Date: 08/22/2020    History of Present Illness Corey Salas is a 60 y.o. male with medical history significant of PTSD, CAD s/p 6 stents now including a STEMI just earlier this month at OSH in [place] (although they wanted to do CABG, they put in another stent then because he kept having cardiac arrest on the cath lab table). Despite events of 08/06/20, pt recovered rather quickly and was actually discharged on 08/07/20. Pt presents to ED after experiencing R sided weakness, facial droop and speech difficulties at 3AM and continued symptoms after awakening at 9AM. MRI confirmed small acute ischemic stroke in L centrum semiovale.   Clinical Impression   PTA, pt lives with spouse who works and reports Independence in all ADLs, IADLs and mobility without AD. Pt presents now, reporting feeling back to baseline. Pt's B UE strength 5/5 and symmetrical. Pt with tremors in B hands though reports this is normal. Vision, cognition and balance appear WFL. Pt overall Independent for bed mobility, hallway mobility (with head turning challenges) and ADLs with no apparent LOB or safety concerns. Reinforced CVA signs and symptoms. Pt inquiring when he can discharge home. Wife present and supportive during session. No further skilled OT services needed at acute level or on DC. OT to sign off.     Follow Up Recommendations  No OT follow up    Equipment Recommendations  None recommended by OT    Recommendations for Other Services       Precautions / Restrictions Precautions Precautions: Fall Restrictions Weight Bearing Restrictions: No      Mobility Bed Mobility Overal bed mobility: Independent                  Transfers Overall transfer level: Independent Equipment used: None                  Balance Overall balance assessment: No apparent balance deficits (not formally  assessed)                                         ADL either performed or assessed with clinical judgement   ADL Overall ADL's : Independent                                       General ADL Comments: Able to complete ADLs, mobility in hallway (head turning) without AD and without LOB or safety concerns. Pt with fast pace.     Vision Baseline Vision/History: Wears glasses Wears Glasses: Reading only Patient Visual Report: No change from baseline Vision Assessment?: No apparent visual deficits     Perception     Praxis      Pertinent Vitals/Pain Pain Assessment: No/denies pain     Hand Dominance Right   Extremity/Trunk Assessment Upper Extremity Assessment Upper Extremity Assessment: Overall WFL for tasks assessed   Lower Extremity Assessment Lower Extremity Assessment: Defer to PT evaluation   Cervical / Trunk Assessment Cervical / Trunk Assessment: Normal   Communication Communication Communication: No difficulties   Cognition Arousal/Alertness: Awake/alert Behavior During Therapy: WFL for tasks assessed/performed;Restless Overall Cognitive Status: Within Functional Limits for tasks assessed  General Comments: A&OX4, pleasant and participatory. a bit anxious and questioning when he can leave, reports unaware of CVA (wife present and reports he was asleep when she spoke to MD).   General Comments  HR 80s after fast pace walk in hallway. Reinforced CVA signs/symptoms. Wife present and supportive    Exercises     Shoulder Instructions      Home Living Family/patient expects to be discharged to:: Private residence Living Arrangements: Spouse/significant other Available Help at Discharge: Family;Available PRN/intermittently Type of Home: House Home Access: Level entry (at back door)     Home Layout: Multi-level Alternate Level Stairs-Number of Steps: 2 steps into den/kitchen    Bathroom Shower/Tub: Tub/shower unit;Walk-in shower   Bathroom Toilet: Standard     Home Equipment: Shower seat - built in;Grab bars - tub/shower;Hand held shower head   Additional Comments: Wife works      Prior Functioning/Environment Level of Independence: Independent        Comments: Independent with ADLs, IADLs and mobility without AD. Pt likes to tub soak        OT Problem List:        OT Treatment/Interventions:      OT Goals(Current goals can be found in the care plan section) Acute Rehab OT Goals Patient Stated Goal: go home by tomorrow OT Goal Formulation: All assessment and education complete, DC therapy  OT Frequency:     Barriers to D/C:            Co-evaluation              AM-PAC OT "6 Clicks" Daily Activity     Outcome Measure Help from another person eating meals?: None Help from another person taking care of personal grooming?: None Help from another person toileting, which includes using toliet, bedpan, or urinal?: None Help from another person bathing (including washing, rinsing, drying)?: None Help from another person to put on and taking off regular upper body clothing?: None Help from another person to put on and taking off regular lower body clothing?: None 6 Click Score: 24   End of Session Equipment Utilized During Treatment: Gait belt Nurse Communication: Mobility status;Other (comment) (wife requesting nicotine patch)  Activity Tolerance: Patient tolerated treatment well Patient left: in bed;with call bell/phone within reach;with bed alarm set;with family/visitor present  OT Visit Diagnosis: Muscle weakness (generalized) (M62.81)                Time: 1610-9604 OT Time Calculation (min): 18 min Charges:  OT General Charges $OT Visit: 1 Visit OT Evaluation $OT Eval Low Complexity: 1 Low  Bradd Canary, OTR/L Acute Rehab Services Office: [phone]  Lorre Munroe 08/22/2020, 8:36 AM

## 2020-08-22 NOTE — Progress Notes (Signed)
Carotid duplex has been completed.   Preliminary results in CV Proc.   Blanch Media 08/22/2020 10:45 AM

## 2020-08-22 NOTE — Consult Note (Addendum)
Hospital Consult    Reason for Consult:  L CVA, L ICA stenosis Requesting Physician:  [facility] MRN #:  [id number]  History of Present Illness: This is a 59 y.o. male with past medical history significant for CAD with coronary stenting 2 weeks prior.  He was brought to the emergency department with dysarthria and right arm weakness and coordination deficit.  Work-up included MRI brain demonstrating left-sided CVA.  Work-up also included CTA neck demonstrating at least 60% stenosis of proximal left ICA and this was confirmed with a carotid duplex demonstrating 60 to 79% stenosis of left ICA.  On exam today patient states his speech and right arm are back to normal.  He is very anxious and would like to be discharged home.  He is already on aspirin and Brilinta from recent coronary stenting.  He is also on a statin daily.  Past Medical History:  Diagnosis Date  . CAD (coronary artery disease)   . Chest pain   . PTSD (post-traumatic stress disorder)   . STEMI (ST elevation myocardial infarction) (HCC) 08/06/2020    Past Surgical History:  Procedure Laterality Date  . APPENDECTOMY    . CORONARY ANGIOPLASTY WITH STENT PLACEMENT     6X    No Known Allergies  Prior to Admission medications   Medication Sig Start Date End Date Taking? Authorizing Provider  aspirin EC 81 MG tablet Take 1 tablet (81 mg total) by mouth daily. Swallow whole. 07/02/20  Yes Tobb, Kardie, DO  BRILINTA 90 MG TABS tablet Take 90 mg by mouth 2 (two) times daily. 08/07/20  Yes [provider]  carvedilol (COREG) 12.5 MG tablet Take 12.5 mg by mouth 2 (two) times daily. 08/07/20  Yes [provider]  Multiple Vitamins-Minerals (CENTRUM SILVER 50+MEN) TABS Take 1 tablet by mouth daily.   Yes [provider]  nitroGLYCERIN (NITROLINGUAL) 0.4 MG/SPRAY spray Place 1 spray under the tongue daily as needed for chest pain. 06/29/20  Yes [provider]  nitroGLYCERIN (NITROSTAT) 0.4 MG SL tablet Place  0.4 mg under the tongue every 5 (five) minutes as needed for chest pain.   Yes [provider]  rosuvastatin (CRESTOR) 20 MG tablet Take 20 mg by mouth daily. 08/07/20  Yes [provider]  sildenafil (REVATIO) 20 MG tablet Take 20 mg by mouth daily as needed (ed).   Yes [provider]  vitamin B-12 (CYANOCOBALAMIN) 100 MCG tablet Take 100 mcg by mouth daily.   Yes [provider]    Social History   Socioeconomic History  . Marital status: Unknown    Spouse name: Not on file  . Number of children: Not on file  . Years of education: Not on file  . Highest education level: Not on file  Occupational History  . Not on file  Tobacco Use  . Smoking status: Current Every Day Smoker  . Smokeless tobacco: Never Used  Substance and Sexual Activity  . Alcohol use: Yes    Alcohol/week: 7.0 standard drinks    Types: 7 Cans of beer per week  . Drug use: Not on file  . Sexual activity: Not on file  Other Topics Concern  . Not on file  Social History Narrative  . Not on file   Social Determinants of Health   Financial Resource Strain: Not on file  Food Insecurity: Not on file  Transportation Needs: Not on file  Physical Activity: Not on file  Stress: Not on file  Social Connections: Not on  file  Intimate Partner Violence: Not on file     Family History  Problem Relation Age of Onset  . Heart attack Mother   . Heart attack Father     ROS: Otherwise negative unless mentioned in HPI  Physical Examination  Vitals:   08/22/20 0611 08/22/20 0848  BP: (!) 141/100 (!) 183/102  Pulse: 80 77  Resp: 18 18  Temp: 97.8 F (36.6 C) 98.2 F (36.8 C)  SpO2: 98% 98%   Body mass index is 24.63 kg/m.  General:  WDWN in NAD Gait: Not observed HENT: WNL, normocephalic Pulmonary: normal non-labored breathing Cardiac: regular Abdomen:  soft, NT/ND, no masses Skin: without rashes Vascular Exam/Pulses: symmetrical radial pulses; feet are warm and  well-perfused Extremities: without ischemic changes, without Gangrene , without cellulitis; without open wounds;  Musculoskeletal: no muscle wasting or atrophy  Neurologic: A&O X 3; CN grossly intact Psychiatric:  The pt has Normal affect. Lymph:  Unremarkable  CBC    Component Value Date/Time   WBC 10.8 (H) 08/21/2020 2033   RBC 5.20 08/21/2020 2033   HGB 18.0 (H) 08/21/2020 2040   HCT 53.0 (H) 08/21/2020 2040   PLT 274 08/21/2020 2033   MCV 102.5 (H) 08/21/2020 2033   MCH 34.8 (H) 08/21/2020 2033   MCHC 34.0 08/21/2020 2033   RDW 13.3 08/21/2020 2033   LYMPHSABS 4.4 (H) 08/21/2020 2033   MONOABS 0.5 08/21/2020 2033   EOSABS 0.4 08/21/2020 2033   BASOSABS 0.1 08/21/2020 2033    BMET    Component Value Date/Time   NA 141 08/21/2020 2040   K 3.8 08/21/2020 2040   CL 108 08/21/2020 2040   CO2 22 08/21/2020 2033   GLUCOSE 78 08/21/2020 2040   BUN 7 08/21/2020 2040   CREATININE 1.00 08/21/2020 2040   CALCIUM 10.2 08/21/2020 2033   GFRNONAA >60 08/21/2020 2033    COAGS: Lab Results  Component Value Date   INR 1.0 08/21/2020     Non-Invasive Vascular Imaging:   MRI brain demonstrating left-sided CVA  CTA neck with left ICA 60% stenosis  Carotid duplex 60 to 79% left ICA stenosis   ASSESSMENT/PLAN: This is a 60 y.o. male with left-sided CVA, and at least 60% stenosis of proximal left internal carotid artery on CTA and carotid duplex  -Subjectively, patient believes his speech and right upper extremity are back to normal and strokelike symptoms have completely resolved -CTA demonstrates 60% stenosis of proximal left ICA which was confirmed by carotid duplex -Plan will be for left TCAR likely this Friday, 08/24/2020 to reduce future stroke risk; patient will continue his aspirin, Brilinta, and statin perioperatively -Okay for discharge home from vascular surgery standpoint -On-call vascular surgeon Dr. Yalda Herd also evaluated the patient today and will provide further  treatment plans   Matthew Eveland PA-C [facility] [phone]  VASCULAR STAFF ADDENDUM: I have independently interviewed and examined the patient. I agree with the above.  Onset of dysarthria and R upper extremity weakness / clumsiness yesterday morning. Nearly resolved on my evaluation. Recent history of STEMI and PCI. On ASA / Brilinta. Treated at OSH. Perhaps some subtle R grip weakness on exam. No CN deficit or other weakness identified on my exam. CT Head = no ICH CTA = LICA 60% stenosis, high bifurcation (CEA would require exposure from C3 --> C2) MRI = small left centrum semiovale acute stroke.  A/P: Symptomatic left 60 - 79 % carotid artery stenosis.   The patient's carotid artery stenosis merits   consideration of revascularization to reduce the risk of future stroke because of carotid stenosis >50% with symptoms of TIA/CVA attributable to carotid lesion. I quoted the patient risk reduction from intervention of ~17% for symptomatic stenosis based on data from the NASCET trial  The patient is a good candidate for transcarotid artery revascularization (TCAR) because of multivessel CAD with recent MI, and relatively high lesion. The lesion appears anatomically amenable to TCAR.   The patient should continue best medical therapy for carotid artery stenosis including: Complete cessation from all tobacco products. Blood glucose control with goal A1c < 7%. Blood pressure control with goal blood pressure < 140/90 mmHg. Lipid reduction therapy with goal LDL-C <100 mg/dL (<25 if symptomatic from carotid artery stenosis).  Aspirin 81mg  PO QD.  Ticagrelor 90mg  PO BID. Atorvastatin 40-80mg  PO QD (or other "high intensity" statin therapy).  Plan TCAR Friday with myself and Dr. .   Saturday. Myra Gianotti, MD [facility] Phone Number: [phone] 08/22/2020 11:47 AM

## 2020-08-22 NOTE — H&P (View-Only) (Signed)
Hospital Consult    Reason for Consult:  L CVA, L ICA stenosis Requesting Physician:  [facility] MRN #:  [id number]  History of Present Illness: This is a 60 y.o. male with past medical history significant for CAD with coronary stenting 2 weeks prior.  He was brought to the emergency department with dysarthria and right arm weakness and coordination deficit.  Work-up included MRI brain demonstrating left-sided CVA.  Work-up also included CTA neck demonstrating at least 60% stenosis of proximal left ICA and this was confirmed with a carotid duplex demonstrating 60 to 79% stenosis of left ICA.  On exam today patient states his speech and right arm are back to normal.  He is very anxious and would like to be discharged home.  He is already on aspirin and Brilinta from recent coronary stenting.  He is also on a statin daily.  Past Medical History:  Diagnosis Date  . CAD (coronary artery disease)   . Chest pain   . PTSD (post-traumatic stress disorder)   . STEMI (ST elevation myocardial infarction) (HCC) 08/06/2020    Past Surgical History:  Procedure Laterality Date  . APPENDECTOMY    . CORONARY ANGIOPLASTY WITH STENT PLACEMENT     6X    No Known Allergies  Prior to Admission medications   Medication Sig Start Date End Date Taking? Authorizing Provider  aspirin EC 81 MG tablet Take 1 tablet (81 mg total) by mouth daily. Swallow whole. 07/02/20  Yes Tobb, Kardie, DO  BRILINTA 90 MG TABS tablet Take 90 mg by mouth 2 (two) times daily. 08/07/20  Yes [provider]  carvedilol (COREG) 12.5 MG tablet Take 12.5 mg by mouth 2 (two) times daily. 08/07/20  Yes [provider]  Multiple Vitamins-Minerals (CENTRUM SILVER 50+MEN) TABS Take 1 tablet by mouth daily.   Yes [provider]  nitroGLYCERIN (NITROLINGUAL) 0.4 MG/SPRAY spray Place 1 spray under the tongue daily as needed for chest pain. 06/29/20  Yes [provider]  nitroGLYCERIN (NITROSTAT) 0.4 MG SL tablet Place  0.4 mg under the tongue every 5 (five) minutes as needed for chest pain.   Yes [provider]  rosuvastatin (CRESTOR) 20 MG tablet Take 20 mg by mouth daily. 08/07/20  Yes [provider]  sildenafil (REVATIO) 20 MG tablet Take 20 mg by mouth daily as needed (ed).   Yes [provider]  vitamin B-12 (CYANOCOBALAMIN) 100 MCG tablet Take 100 mcg by mouth daily.   Yes [provider]    Social History   Socioeconomic History  . Marital status: Unknown    Spouse name: Not on file  . Number of children: Not on file  . Years of education: Not on file  . Highest education level: Not on file  Occupational History  . Not on file  Tobacco Use  . Smoking status: Current Every Day Smoker  . Smokeless tobacco: Never Used  Substance and Sexual Activity  . Alcohol use: Yes    Alcohol/week: 7.0 standard drinks    Types: 7 Cans of beer per week  . Drug use: Not on file  . Sexual activity: Not on file  Other Topics Concern  . Not on file  Social History Narrative  . Not on file   Social Determinants of Health   Financial Resource Strain: Not on file  Food Insecurity: Not on file  Transportation Needs: Not on file  Physical Activity: Not on file  Stress: Not on file  Social Connections: Not on  file  Intimate Partner Violence: Not on file     Family History  Problem Relation Age of Onset  . Heart attack Mother   . Heart attack Father     ROS: Otherwise negative unless mentioned in HPI  Physical Examination  Vitals:   08/22/20 0611 08/22/20 0848  BP: (!) 141/100 (!) 183/102  Pulse: 80 77  Resp: 18 18  Temp: 97.8 F (36.6 C) 98.2 F (36.8 C)  SpO2: 98% 98%   Body mass index is 24.63 kg/m.  General:  WDWN in NAD Gait: Not observed HENT: WNL, normocephalic Pulmonary: normal non-labored breathing Cardiac: regular Abdomen:  soft, NT/ND, no masses Skin: without rashes Vascular Exam/Pulses: symmetrical radial pulses; feet are warm and  well-perfused Extremities: without ischemic changes, without Gangrene , without cellulitis; without open wounds;  Musculoskeletal: no muscle wasting or atrophy  Neurologic: A&O X 3; CN grossly intact Psychiatric:  The pt has Normal affect. Lymph:  Unremarkable  CBC    Component Value Date/Time   WBC 10.8 (H) 08/21/2020 2033   RBC 5.20 08/21/2020 2033   HGB 18.0 (H) 08/21/2020 2040   HCT 53.0 (H) 08/21/2020 2040   PLT 274 08/21/2020 2033   MCV 102.5 (H) 08/21/2020 2033   MCH 34.8 (H) 08/21/2020 2033   MCHC 34.0 08/21/2020 2033   RDW 13.3 08/21/2020 2033   LYMPHSABS 4.4 (H) 08/21/2020 2033   MONOABS 0.5 08/21/2020 2033   EOSABS 0.4 08/21/2020 2033   BASOSABS 0.1 08/21/2020 2033    BMET    Component Value Date/Time   NA 141 08/21/2020 2040   K 3.8 08/21/2020 2040   CL 108 08/21/2020 2040   CO2 22 08/21/2020 2033   GLUCOSE 78 08/21/2020 2040   BUN 7 08/21/2020 2040   CREATININE 1.00 08/21/2020 2040   CALCIUM 10.2 08/21/2020 2033   GFRNONAA >60 08/21/2020 2033    COAGS: Lab Results  Component Value Date   INR 1.0 08/21/2020     Non-Invasive Vascular Imaging:   MRI brain demonstrating left-sided CVA  CTA neck with left ICA 60% stenosis  Carotid duplex 60 to 79% left ICA stenosis   ASSESSMENT/PLAN: This is a 60 y.o. male with left-sided CVA, and at least 60% stenosis of proximal left internal carotid artery on CTA and carotid duplex  -Subjectively, patient believes his speech and right upper extremity are back to normal and strokelike symptoms have completely resolved -CTA demonstrates 60% stenosis of proximal left ICA which was confirmed by carotid duplex -Plan will be for left TCAR likely this Friday, 08/24/2020 to reduce future stroke risk; patient will continue his aspirin, Brilinta, and statin perioperatively -Okay for discharge home from vascular surgery standpoint -On-call vascular surgeon Dr. Lenell Antu also evaluated the patient today and will provide further  treatment plans   Emilie Rutter PA-C [facility] [phone]  VASCULAR STAFF ADDENDUM: I have independently interviewed and examined the patient. I agree with the above.  Onset of dysarthria and R upper extremity weakness / clumsiness yesterday morning. Nearly resolved on my evaluation. Recent history of STEMI and PCI. On ASA / Brilinta. Treated at OSH. Perhaps some subtle R grip weakness on exam. No CN deficit or other weakness identified on my exam. CT Head = no ICH CTA = LICA 60% stenosis, high bifurcation (CEA would require exposure from C3 --> C2) MRI = small left centrum semiovale acute stroke.  A/P: Symptomatic left 60 - 79 % carotid artery stenosis.   The patient's carotid artery stenosis merits  consideration of revascularization to reduce the risk of future stroke because of carotid stenosis >50% with symptoms of TIA/CVA attributable to carotid lesion. I quoted the patient risk reduction from intervention of ~17% for symptomatic stenosis based on data from the NASCET trial  The patient is a good candidate for transcarotid artery revascularization (TCAR) because of multivessel CAD with recent MI, and relatively high lesion. The lesion appears anatomically amenable to TCAR.   The patient should continue best medical therapy for carotid artery stenosis including: Complete cessation from all tobacco products. Blood glucose control with goal A1c < 7%. Blood pressure control with goal blood pressure < 140/90 mmHg. Lipid reduction therapy with goal LDL-C <100 mg/dL (<25 if symptomatic from carotid artery stenosis).  Aspirin 81mg  PO QD.  Ticagrelor 90mg  PO BID. Atorvastatin 40-80mg  PO QD (or other "high intensity" statin therapy).  Plan TCAR Friday with myself and Dr. .   Saturday. Myra Gianotti, MD [facility] Phone Number: [phone] 08/22/2020 11:47 AM

## 2020-08-22 NOTE — ED Triage Notes (Addendum)
Pt arrived via  EMS for cc of Code Stroke. PT reports drinking 6 beers before starting to "not feel right" and left posterior headache. Pt then went to bed at 0300 (LNW). Upon waking around 1200 patient reports still not feeling well. Pts wife arrived from work at approximately 1900 to find pt slurring words,with  generalized weakness, notable weakness in right arm, leg, and asymmetrical smile. Pt A&Ox4, GCS 15. VSS. 20g in LAC.

## 2020-08-23 ENCOUNTER — Encounter (HOSPITAL_COMMUNITY): Payer: Self-pay | Admitting: Vascular Surgery

## 2020-08-23 NOTE — Progress Notes (Signed)
Corey Salas denies chest pain or shortness of breath at this time, patient tested negative for Covid on 4/19.22.  Corey Salas reported that the chest pressure he had on 08/21/20 was from anxiety, "I was having a low day."  "the pressure was very different from the pain I had , when I went to the hospital in [facility], and I was short of breath.  Corey Salas reports that he had been short of breath since Christmas, " I thought it was because I am out of shape."  "I would wake up with chest pain and take 2 squirts of the NTG spray, chest pain would subside in 5 minutes or this."  Corey Salas reports that he would use NTG on average 2 times a week. Patient said he had a well visit with PCP after Christmas, but did not mention  to the Dr.  I [facility] [place], Corey Salas did not follow up with cardiologist, but he said That Dr Collie Siad there, is his cardiologist who he has seen in the past.

## 2020-08-23 NOTE — Progress Notes (Addendum)
Anesthesia Chart Review: Same day workup  Patient with significant history of CAD and multiple PCI.  He was seen by cardiologist Dr. Servando Salina 06/24/2020 at the request of patient's PCP for evaluation of intermittent chest pain and to reestablish with cardiology.  Per her note, "Corey Salas is a 60 y.o. male with a hx of coronary artery disease status post PCI x5, his initial PCI was in 2006 secondary to MI with PCI to the RCA, then in 2009 he had 2 stents 1 to the RCA and the LAD and he tells me 7 years ago he had 2 stents placed in [place], hypertension, hyperlipidemia and smoker.  Since that time he stopped his aspirin and Plavix he stopped all of his cardiac medications.  He tells me he does not tolerate statin and he is never gone to take them."  At that time it was recommended the patient proceed with cardiac catheterization and echocardiogram.  Before this could be completed, the patient traveled to [place] where he suffered another MI.  He was hospitalized 08/05/2020 through 08/07/2020 at [facility] for acute STEMI.  He was found to have acute inferior wall myocardial infarction treated with DES to mid RCA, POBA rPL.  Echo showed EF 50 to 55%, no significant valvular abnormalities, inferior hypokinesis.  He was also noted to have significant residual left main disease and multivessel CAD.  While hospitalized, cardiothoracic surgery was consulted and per their notes it was recommended he return to [facility] in 4 to 6 weeks for CABG.  He was discharged on ASA and Brilinta.  On 08/21/2020 patient presented to [facility] ED with right-sided weakness and slurred speech.  Work-up included MRI brain demonstrating left-sided CVA.  Work-up also included CTA neck demonstrating at least 60% stenosis of proximal left ICA and this was confirmed with a carotid duplex demonstrating 60 to 79% stenosis of left ICA.  Vascular surgery was consulted and TCAR was recommended.  It was noted that the  patient was on ASA and Brilinta for recent STEMI and PCI and he was advised to stay on this perioperatively.  Echo was ordered during hospitalization but not completed prior to discharge.  Cardiology consult was evidently ordered during admission but not completed prior to discharge.  Records from patient admission [facility] in [place], [place] were received and reviewed.  We have not yet received cath report.  Medical records department is having some difficulty accessing the system on which it is stored.  Reviewed case with Dr. Marguerita Merles who advised that the patient would need cardiac evaluation and risk stratification prior to surgery.  Patient is now scheduled to see Dr. Royann Shivers 08/24/2020 at 4 PM for preop evaluation.  I spoke with patient's wife to relay this information.  She verbalized understanding of surgery being canceled and need to follow-up with cardiology at 4 PM on 4/22.     TTE 08/06/2020 [facility] [place]): Summary: Normal mitral valve structure and function. Trace mitral valve regurgitation. Normal aortic valve structure and function. No aortic regurgitation. No aortic stenosis. Normal tricuspid valve structure and function. Trace tricuspid valve regurgitation. Normal right ventricular systolic pressure. Normal pulmonic valve structure and function. No evidence of any pulmonic regurgitation. The left atrium is normal. Low normal left ventricular systolic function. There are left ventricular regional wall motion abnormalities.  See appended scoring diagram. Normal left ventricular diastolic function. Normal left ventricular wall thickness. The left ventricular ejection fraction by visual  estimation is 50 to 55%. The right atrium is normal. Normal right ventricular size, structure and function. Aortic root appearance and dimensions are normal. No evidence of pericardial effusion.   Zannie Cove [facility]  [facility]/Anesthesiology Phone [phone] 08/23/2020 3:55 PM

## 2020-08-24 ENCOUNTER — Encounter: Payer: Self-pay | Admitting: Cardiovascular Disease

## 2020-08-24 ENCOUNTER — Ambulatory Visit: Payer: Managed Care, Other (non HMO) | Admitting: Cardiovascular Disease

## 2020-08-24 ENCOUNTER — Other Ambulatory Visit: Payer: Self-pay

## 2020-08-24 VITALS — BP 125/80 | HR 73 | Ht 71.0 in | Wt 174.8 lb

## 2020-08-24 DIAGNOSIS — I6522 Occlusion and stenosis of left carotid artery: Secondary | ICD-10-CM

## 2020-08-24 DIAGNOSIS — N529 Male erectile dysfunction, unspecified: Secondary | ICD-10-CM

## 2020-08-24 DIAGNOSIS — E782 Mixed hyperlipidemia: Secondary | ICD-10-CM

## 2020-08-24 DIAGNOSIS — I25111 Atherosclerotic heart disease of native coronary artery with angina pectoris with documented spasm: Secondary | ICD-10-CM

## 2020-08-24 DIAGNOSIS — I10 Essential (primary) hypertension: Secondary | ICD-10-CM | POA: Diagnosis not present

## 2020-08-24 DIAGNOSIS — I25118 Atherosclerotic heart disease of native coronary artery with other forms of angina pectoris: Secondary | ICD-10-CM

## 2020-08-24 MED ORDER — ALPRAZOLAM 1 MG PO TABS: ORAL_TABLET | ORAL | 0 refills | Status: DC

## 2020-08-24 NOTE — Patient Instructions (Signed)
Medication Instructions:  Take one half to one tablet of alprazolam every 8 hours as needed for anxiety.   *If you need a refill on your cardiac medications before your next appointment, please call your pharmacy*   Lab Work: None ordered If you have labs (blood work) drawn today and your tests are completely normal, you will receive your results only by: Marland Kitchen MyChart Message (if you have MyChart) OR . A paper copy in the mail If you have any lab test that is abnormal or we need to change your treatment, we will call you to review the results.   Testing/Procedures: None ordered  Follow-Up: At [facility], you and your health needs are our priority.  As part of our continuing mission to provide you with exceptional heart care, we have created designated Provider Care Teams.  These Care Teams include your primary Cardiologist (physician) and Advanced Practice Providers (APPs -  Physician Assistants and Nurse Practitioners) who all work together to provide you with the care you need, when you need it.  We recommend signing up for the patient portal called "MyChart".  Sign up information is provided on this After Visit Summary.  MyChart is used to connect with patients for Virtual Visits (Telemedicine).  Patients are able to view lab/test results, encounter notes, upcoming appointments, etc.  Non-urgent messages can be sent to your provider as well.   To learn more about what you can do with MyChart, go to [email or web address].    Your next appointment:   Sep 07, 2020 at 11:00am

## 2020-08-24 NOTE — Progress Notes (Signed)
Cardiology Office Note:    Date:  08/26/2020   ID:  Corey Salas, DOB 02-Sep-1960, MRN [id number]  PCP:  [facility]  Cardiologist:  Christell Constant, MD  Advanced Practice Provider:  No care team member to display Electrophysiologist:  None       Referring MD: No ref. provider found   Chief Complaint  Patient presents with  . Coronary Artery Disease  . Cerebrovascular Accident  . Pre-op Exam    History of Present Illness:    He was previously seen by Dr. Servando Salina, but was in the process of transitioning to Dr. Izora Ribas, when an urgent consult was requested by the Anesthesiology service, for "clearance" prior to planned carotid procedure in 1 week.  Corey Salas is a 60 y.o. 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  His initial presentation for coronary disease was in 2006 at age 6, when he had a myocardial infarction with placement of a drug-eluting stent to the right coronary artery (Taxus 3.5x16).  In 2009 he had stents placed to the RCA (Taxus 2.75x12) and LAD (Taxus 3.0x18).  In 2015 he had 2 stents placed while he was living in [place] (Records not available for that procedure).  Most of his cardiology care has been in [facility].  During one of his catheterizations he was noted to have vasospasm and has been diagnosed with Prinzmetal's angina (Dr. Collie Siad).  Most recently, also in [facility], he had an acute inferior myocardial  infarction on August 10, 2020 and received a drug-eluting stent to the right coronary artery it was also noted that he had severe disease in the left main coronary artery and multivessel stenoses.  The decision was made to proceed with stenting because he "kept having cardiac arrest on the Cath Lab table".  The plan was for him to undergo subsequent bypass surgery after 5 weeks of dual antiplatelet therapy.  However he now lives,  and his wife works, in [place] so they returned home, with a plan to undergo surgical evaluation here.  He has not had any angina pectoris since the last myocardial infarction.  He was told that his heart muscle was well preserved.  Our anesthesiology team had access to an echo report that showed "left ventricular systolic function was low normal with EF 50-55% and inferior wall hypokinesis.  There were no significant valvular abnormalities".  Unfortunately, I cannot locate a copy of that report.  We also do not have a report of the cardiac cath procedure, let alone the images.  In the meantime he had an acute ischemic stroke on 08/21/2020, sudden onset of right-sided weakness, right facial droop, dysarthria and ataxia.  MRI showed a small acute ischemic stroke in the left centrum semiovale.  CT angiography showed a 60% left carotid stenosis.  His neurological symptoms improved rapidly and at this point he only has some mild clumsiness in his right hand.  Cardiology did not see him during that hospitalization.  He was continued on aspirin and Brilinta and statin, scheduled for TCAR (relatively high location of  the stenosis; recent MI with need for uninterrupted antiplatelet therapy) on Friday, April 29 with Dr. Lenell Antu.   He was evaluated by the anesthesiology team on 08/23/2020 and they requested cardiology evaluation before proceeding with the planned surgical procedure.  He tells me he has decided not to go to have that surgery.  He is worried that he should have heart surgery  first.  He has not had any new neurological events since discharge from the hospital.  Right now he actually feels pretty well and denies any chest pain at rest with activity, shortness of breath at rest or with activity, palpitations, dizziness, syncope, lower extremity edema, claudication, cough, wheezing, bleeding problems.  He has mild problems with coordination of the fingers of his right hand but otherwise no neurological complaints and is speaking fluently.  Past medical problems also include a history of GI bleeding from gastritis, more than 10 years ago.  This resolved with temporary discontinuation of aspirin.  He has hypertension that has generally been well treated.  He has a history of erectile dysfunction that responded to sildenafil.  He has a history of benign gynecomastia and stable pulmonary nodules.  He had delayed urticaria and angioedema after his first cardiac catheterization 2006, but this has not recurred on subsequent procedures.  Mr. Stmarie has a long history of PTSD.  I did not explore the origins of this at today's appointment).  He has also had a lot of reluctance to take medications in general to embark upon preventive health measures.  He only agreed to restart taking statins after his most recent acute MI in early April.  He continues to smoke about a pack of cigarettes a day.  He is never really been able to quit smoking in over 40 years.  In the past he also used to consume alcohol heavily, often consumes a daily sixpack of beer right now.  He has a lot of issues with anxiety.    He did very poorly in the hospital for his recent stroke when he was denied a nicotine patch.  He is originally from the [place], close to [place].  Past Medical History:  Diagnosis Date  . CAD (coronary artery disease)   . Chest pain   . Dyspnea    08/23/20- "just before my heart attack, it is normal now, unless I I am stressed/"  . Family history of adverse reaction to  anesthesia 08/21/2020  . Neuropathy    "bottom of my feet, they say it is from alcohol, I just drink beer now, not spirits."  . PTSD (post-traumatic stress disorder)   . PTSD (post-traumatic stress disorder)   . STEMI (ST elevation myocardial infarction) (HCC) 08/06/2020  . Stroke (HCC)    08/23/20 slight weakness in right hand - can type and write- "I have to concentrate to keep pen in hand."    Past Surgical History:  Procedure Laterality Date  . APPENDECTOMY    . CORONARY ANGIOPLASTY WITH STENT PLACEMENT     6X    Current Medications: Current Meds  Medication Sig  . ALPRAZolam (XANAX) 1 MG tablet Take half a tablet to one tablet every 8 hours as needed for anxiety.  Marland Kitchen aspirin EC 81 MG tablet Take 1 tablet (81 mg total) by mouth daily. Swallow whole.  Marland Kitchen BRILINTA 90 MG TABS tablet Take 90 mg by mouth 2 (two) times daily.  . carvedilol (COREG) 12.5 MG tablet Take 12.5 mg by mouth 2 (two) times daily.  Marland Kitchen  Multiple Vitamins-Minerals (CENTRUM SILVER 50+MEN) TABS Take 1 tablet by mouth daily.  . nitroGLYCERIN (NITROLINGUAL) 0.4 MG/SPRAY spray Place 1 spray under the tongue daily as needed for chest pain.  . nitroGLYCERIN (NITROSTAT) 0.4 MG SL tablet Place 0.4 mg under the tongue every 5 (five) minutes as needed for chest pain.  . rosuvastatin (CRESTOR) 20 MG tablet Take 20 mg by mouth daily.  . sildenafil (REVATIO) 20 MG tablet Take 20 mg by mouth daily as needed (ed).  . [DISCONTINUED] vitamin B-12 (CYANOCOBALAMIN) 100 MCG tablet Take 100 mcg by mouth daily.     Allergies:   Patient has no known allergies.   Social History   Socioeconomic History  . Marital status: Unknown    Spouse name: Not on file  . Number of children: Not on file  . Years of education: Not on file  . Highest education level: Not on file  Occupational History  . Not on file  Tobacco Use  . Smoking status: Current Every Day Smoker    Packs/day: 1.00  . Smokeless tobacco: Never Used  . Tobacco comment:  Patient rolls his cigarettes- equaliavant to 1 pakg a day.  Vaping Use  . Vaping Use: Never used  Substance and Sexual Activity  . Alcohol use: Yes    Alcohol/week: 48.0 standard drinks    Types: 48 Cans of beer per week  . Drug use: Never  . Sexual activity: Not on file  Other Topics Concern  . Not on file  Social History Narrative  . Not on file   Social Determinants of Health   Financial Resource Strain: Not on file  Food Insecurity: Not on file  Transportation Needs: Not on file  Physical Activity: Not on file  Stress: Not on file  Social Connections: Not on file     Family History: The patient's family history includes Heart attack in his father and mother.  His father died of heart disease at age 60.  ROS:   Please see the history of present illness.     All other systems reviewed and are negative.  EKGs/Labs/Other Studies Reviewed:    The following studies were reviewed today:   Carotid duplex ultrasound 08/22/2020 Right Carotid: Velocities in the right ICA are consistent with a 1-39% stenosis.  Left Carotid: Velocities in the left ICA are consistent with a 60-79% stenosis.  Vertebrals: Bilateral vertebral arteries demonstrate antegrade flow.   TTE 08/06/2020 [facility] Summary: Normal mitral valve structure and function. Trace mitral valve regurgitation. Normal aortic valve structure and function. No aortic regurgitation. No aortic stenosis. Normal tricuspid valve structure and function. Trace tricuspid valve regurgitation. Normal right ventricular systolic pressure. Normal pulmonic valve structure and function. No evidence of any pulmonic regurgitation. The left atrium is normal. Low normal left ventricular systolic function. There are left ventricular regional wall motion abnormalities.  See appended scoring diagram. Normal left ventricular diastolic function. Normal left ventricular wall thickness. The left  ventricular ejection fraction by visual estimation is 50 to 55%. The right atrium is normal. Normal right ventricular size, structure and function. Aortic root appearance and dimensions are normal. No evidence of pericardial effusion.  EKG:  EKG is ordered today.  The ekg ordered today demonstrates normal sinus rhythm, T wave inversion in the inferior leads without much in the way of Q waves.  The T wave inversions are slightly more prominent, but overall not much change from the previous tracing from July 02, 2020.  Note that  there was no electrocardiogram performed during his hospitalization for stroke.  A couple of saved monitor strips showed normal sinus rhythm.  Recent Labs: 08/21/2020: ALT 38; BUN 7; Creatinine, Ser 1.00; Hemoglobin 18.0; Platelets 274; Potassium 3.8; Sodium 141  Recent Lipid Panel    Component Value Date/Time   CHOL 143 08/22/2020 0454   TRIG 165 (H) 08/22/2020 0454   HDL 65 08/22/2020 0454   CHOLHDL 2.2 08/22/2020 0454   VLDL 33 08/22/2020 0454   LDLCALC 45 08/22/2020 0454     Risk Assessment/Calculations:       Physical Exam:    VS:  BP 125/80   Pulse 73   Ht  (1.803 m)   Wt 174 lb 12.8 oz (79.3 kg)   SpO2 98%   BMI 24.38 kg/m     Wt Readings from Last 3 Encounters:  08/24/20 174 lb 12.8 oz (79.3 kg)  08/21/20 176 lb 9.4 oz (80.1 kg)  07/02/20 181 lb 6.4 oz (82.3 kg)     GEN: Appears comfortable, but a little high strung; well nourished, well developed in no acute distress HEENT: Normal NECK: No JVD; No carotid bruits LYMPHATICS: No lymphadenopathy CARDIAC: RRR, no murmurs, rubs, gallops RESPIRATORY:  Clear to auscultation without rales, wheezing or rhonchi  ABDOMEN: Soft, non-tender, non-distended MUSCULOSKELETAL:  No edema; No deformity  SKIN: Warm and dry NEUROLOGIC:  Alert and oriented x 3 exam is nonfocal.  Speech is fluent.  I think the slightly halting quality of his speech is related to his [place] accent, rather than to  dysarthria. PSYCHIATRIC:  Normal affect   ASSESSMENT:    1. Coronary artery disease involving native coronary artery of native heart with angina pectoris with documented spasm (HCC)   2. Left carotid stenosis   3. Mixed hyperlipidemia   4. Primary hypertension   5. Erectile dysfunction, unspecified erectile dysfunction type    PLAN:    In order of problems listed above:  1. CAD s/p recent IWMI/DES-RCA: Mr. Sermersheim reportedly has severe left main stenosis and other coronary lesions that require surgical revascularization (we do not have the Cath Lab report for the Cath Lab images).  Ideally, dual antiplatelet therapy should be continued for 12 months and any interruption should be avoided in the first 3 months following placement of the drug-eluting stent.  Reportedly, there was a plan for him to have bypass surgery after roughly 5 weeks, if he had stayed in [facility].  However we are now faced with the dilemma of a recent stroke and a moderate stenosis in the left carotid artery that is probably the culprit lesion, therefore with a high risk of recurrent stroke.  Mr. Olivero is concerned that his coronary situation has not yet been addressed, obviously I share that concern.  However, TCAR can be performed while he is still on dual antiplatelet therapy, whereas bypass surgery cannot.  In addition, the risk of recurrent stroke during bypass surgery would be very high at this juncture.  Fortunately, he has not had any angina in the 3 weeks or so that have elapsed from his myocardial infarction.  The situation is complex, but my advice would be to continue with the planned carotid surgical procedure first.  We will continue to try to get not only the report of his cardiac catheterization but ideally the actual images.  In the meantime, continue aspirin, Brilinta, carvedilol, rosuvastatin. 2. LICA stenosis with recent L hemispheric ischemic stroke: Risk of recurrent stroke events is substantial in the  absence of revascularization.  The risk of cardiac complications including recurrent myocardial infarction, congestive heart failure, dangerous arrhythmia and even death is also increased in view of how recently he had his acute MI and the presence of residual serious coronary disease.  In my judgment, it is worth performing the TCAR at this time.  The patient himself is extremely reluctant and tells me that he is going to cancel the surgery scheduled for Friday.  I asked him to reconsider. 3. History of vasospastic angina: Apparently this was confirmed during cardiac catheterization.  He has been using a nitroglycerin spray off and on for years.  Records show that in the past he was treated with amlodipine and diltiazem, but he is no longer on any calcium channel blockers. 4. Hypercholesterolemia: He has been off and on statin therapy over the years, currently his lipid profile is excellent with a LDL of 45 and an HDL of 65.  Continue rosuvastatin. 5. HTN: Medications were temporarily interrupted during his stroke and he had high blood pressure.  Currently his blood pressure is excellent on carvedilol monotherapy. 6. PTSD/anxiety: Although we did not explore the roots of this in any detail today, it has clearly led him to make impulsive decisions regarding his medical care, sometimes to his detriment, in the past.  We should assist him with substitution treatment for nicotine withdrawal and benzodiazepines as needed during his hospitalization.  I am not sure whether or not he has findings to suggest alcohol dependency, but this should be considered if he becomes agitated or anxious. 7. ED: Sildenafil is on his medication list.  He understands that he should never use nitroglycerin or nitroglycerin containing products within 24 hours of taking this medication.        Medication Adjustments/Labs and Tests Ordered: Current medicines are reviewed at length with the patient today.  Concerns regarding  medicines are outlined above.  Orders Placed This Encounter  Procedures  . EKG 12-Lead   Meds ordered this encounter  Medications  . ALPRAZolam (XANAX) 1 MG tablet    Sig: Take half a tablet to one tablet every 8 hours as needed for anxiety.    Dispense:  30 tablet    Refill:  0    Patient Instructions  Medication Instructions:  Take one half to one tablet of alprazolam every 8 hours as needed for anxiety.   *If you need a refill on your cardiac medications before your next appointment, please call your pharmacy*   Lab Work: None ordered If you have labs (blood work) drawn today and your tests are completely normal, you will receive your results only by: Marland Kitchen MyChart Message (if you have MyChart) OR . A paper copy in the mail If you have any lab test that is abnormal or we need to change your treatment, we will call you to review the results.   Testing/Procedures: None ordered  Follow-Up: At [facility], you and your health needs are our priority.  As part of our continuing mission to provide you with exceptional heart care, we have created designated Provider Care Teams.  These Care Teams include your primary Cardiologist (physician) and Advanced Practice Providers (APPs -  Physician Assistants and Nurse Practitioners) who all work together to provide you with the care you need, when you need it.  We recommend signing up for the patient portal called "MyChart".  Sign up information is provided on this After Visit Summary.  MyChart is used to connect with patients for Virtual Visits (  Telemedicine).  Patients are able to view lab/test results, encounter notes, upcoming appointments, etc.  Non-urgent messages can be sent to your provider as well.   To learn more about what you can do with MyChart, go to [email or web address].    Your next appointment:   Sep 07, 2020 at 11:00am     Signed, Thurmon Fair, MD  08/26/2020 1:06 PM    [facility]

## 2020-08-26 ENCOUNTER — Encounter: Payer: Self-pay | Admitting: Cardiovascular Disease

## 2020-08-27 ENCOUNTER — Telehealth: Payer: Self-pay

## 2020-08-27 NOTE — Telephone Encounter (Signed)
Telephone call received from pt/wife requesting to postpone L TCAR surgery until  Dr. Royann Shivers receives further information from cardiologist in [place]. Surgery has been rescheduled to 09/13/20.

## 2020-08-29 ENCOUNTER — Other Ambulatory Visit (HOSPITAL_COMMUNITY): Payer: Managed Care, Other (non HMO)

## 2020-08-29 ENCOUNTER — Inpatient Hospital Stay (HOSPITAL_COMMUNITY): Admission: RE | Admit: 2020-08-29 | Payer: Managed Care, Other (non HMO) | Source: Ambulatory Visit

## 2020-08-30 ENCOUNTER — Other Ambulatory Visit (HOSPITAL_COMMUNITY): Payer: Managed Care, Other (non HMO)

## 2020-08-31 ENCOUNTER — Other Ambulatory Visit: Payer: Self-pay | Admitting: *Deleted

## 2020-08-31 ENCOUNTER — Encounter: Payer: Self-pay | Admitting: *Deleted

## 2020-08-31 NOTE — Patient Outreach (Addendum)
[facility]) Care Management  08/31/2020  ANTONEO GHRIST 26-Feb-1961 [id number]  [facility] outreach for EMMI-stroke Not on APL RED ON EMMI ALERT Day #   6        Date: Wednesday 10 am 08/29/20 Red Alert Reason: Questions/problems with meds? Yes Smoked or been around smoke? Yes  Insurance: [employer] managed [facility] admissions x  1 ED visits x 1 in the last 6 months  Admission 08/21/20-08/22/20 small acute ischemic stroke in left centrum semiovale-60% left carotid stenosis, CAD, primary hypertension, mixed hyperlipidemia  Outreach # 1 successful at [phone] Reached wife  Patient is able to verify HIPAA identifiers [facility] Care Management RN reviewed and addressed red alert with patient  Consent: [facility] RN CM reviewed [facility] services with patient. Patient gave verbal consent for services [facility] telephonic RN CM.   Advised patient that there will be further automated EMMI- post discharge calls to assess how the patient is doing following the recent hospitalization Advised the patient that another call may be received from a nurse if any of their responses were abnormal. Patient voiced understanding and was appreciative of f/u call.    EMMI:   Questions/problems with Medications noting possible side effects suspected from medicines especially diarrhea, fatigue and further generalized weakness since hospital discharge Reports episodes with 3 or more stools in a day Reports Mr Karczewski does not eat many meats primarily fish  He does not tolerate peanut butter or milk products wt from 181 to 174 from February to April 2022   Mrs Petrosky reports they had not needed therapy session upon discharge but are considering sessions related to increase fatigue, generalized weakness since hospital discharge  primary care provider (PCP) [facility] RN CM inquired about pcp services to assist with orders for home or outpatient therapy sessions Does not have a [employer] pcp as of 08/31/20- Denied need of assist from [facility]  RN CM to locate one to establish care  Mrs Keena reports she already has looked at a list of pcp but has not placed a call to establish care related to various social experiences in the recent month [facility] RN CM discussed with her that after hospital discharge the attending providers generally recommend a pcp to continue community level care such as home health or outpatient therapy services She voiced understanding and voices she will establish pcp care services soon  Confirms at this time Mr Nepomuceno has only established care with cardiology, Dr Royann Shivers [facility] RN CM notes an upcoming hospital cardiology appointment for next week  [facility] RN CM encouraged patient to discuss the need for and possible orders for therapy with the cardiologist  Reports a "poor ED experience. They would not listen." Patient goal/ interventions- wife to review online list of providers   Smoking continues and not ready to discontinue   [facility] RN CM care coordination Discussed importance of primary care provider (PCP) services after discharge from hospital for continuity of care  Compared and review after summary and home lists of medicines- reconciled/updated Not taking Buspar, [place] multivitamins nor vitamin B 12  He has been recently started on Brilinta, thiamine, multivitamin Restarted on previously discontinued aspirin and Crestor Discussed an intake of a protein pt is able to tolerate prior to intake of medicine   [facility] RN CM disease management/education    Social: "new to the area"  Recently moved to [place] from [place] Lives at home with wife Erenest Rasher smoker  Past Medical History:  Diagnosis Date  . CAD (coronary artery  disease)   . Chest pain   . Dyspnea    08/23/20- "just before my heart attack, it is normal now, unless I I am stressed/"  . Family history of adverse reaction to anesthesia 08/21/2020  . Neuropathy    "bottom of my feet, they say it is from alcohol, I just drink beer now, not spirits."  . PTSD  (post-traumatic stress disorder)   . PTSD (post-traumatic stress disorder)   . STEMI (ST elevation myocardial infarction) (HCC) 08/06/2020  . Stroke (HCC)    08/23/20 slight weakness in right hand - can type and write- "I have to concentrate to keep pen in hand."   Patient Active Problem List   Diagnosis Date Noted  . Acute ischemic stroke (HCC) 08/21/2020  . Primary hypertension 07/02/2020  . Mixed hyperlipidemia 07/02/2020  . Smoker 07/02/2020  . Chest pain   . CAD (coronary artery disease)    Past Surgical History:  Procedure Laterality Date  . APPENDECTOMY    . CORONARY ANGIOPLASTY WITH STENT PLACEMENT     6X    Plan: Patient agrees to the care plan and follow up within the next 7-10 business days Pt encouraged to return a call to [facility] RN CM prn Provided [facility] RN CM office number and [facility] 24 hour nurse advise line Sent EPIC in basket message r/t worsening symptoms/interest in therapies to Dr Royann Shivers  Goals Addressed              This Visit's Progress     Patient Stated   .  [facility] ) Manage My Medicine (pt-stated)   Not on track     Timeframe:  Short-Term Goal Priority:  High Start Date:                08/31/20             Expected End Date:             09/30/20          Follow Up Date 09/07/20  - call if I am sick and can't take my medicine - keep a list of all the medicines I take; vitamins and herbals too  Call if have side effects from any medication Take food (protein) prior to medicines with known GI side effects     Notes: 08/31/20  noting possible side effects suspected from medicines especially diarrhea, fatigue and further generalized weakness since hospital discharge Reports episodes with 3 or more stools in a day Reports Mr Menna does not eat many meats primarily fish  He does not tolerate peanut butter or milk products wt from 181 to 174 from February to April 2022  He has been recently started on Brilinta, thiamine, multivitamin Restarted on previously  discontinued aspirin and Crestor Discussed an intake of a protein pt is able to tolerate prior to intake of medicine    .  [facility]) Keep or Improve My Strength-Stroke (pt-stated)   On track     Timeframe:  Short-Term Goal Priority:  High Start Date:             08/31/20                Expected End Date:             09/30/20          Follow Up Date 09/07/20  - arrange in-home help services - eat healthy to increase strength - increase activity or exercise time a little every week  Notes:  08/31/20 noting possible side effects suspected from medicines especially diarrhea, fatigue and further generalized weakness since hospital discharge Reports episodes with 3 or more stools in a day does not eat many meats primarily fish  He does not tolerate peanut butter or milk products wt from 181 to 174 from February to April 2022  had not needed therapy session upon discharge but are considering sessions [facility] RN CM discussed with her that after hospital discharge the attending providers generally recommend a pcp to continue community level care such as home health or outpatient therapy services will establish pcp care services soon       [place] L. Noelle Penner, RN, BSN, CCM [facility] Telephonic Care Management Care Coordinator Office number [phone] Mobile number [phone]  Main [facility] number [phone] Fax number [phone]

## 2020-09-03 ENCOUNTER — Telehealth: Payer: Self-pay | Admitting: Cardiovascular Disease

## 2020-09-03 NOTE — Telephone Encounter (Signed)
Called the pts Wife (not on DPR) and requested to speak with the pt, in order to get verbal consent to speak with the wife about his medical care. Advised both that getting a signed DPR completed when the pt comes into the office to see Dr. Salena Saner, is imperative. I did speak with the pt and he did give me verbal consent over the phone to speak with his wife.  Spoke with the pts wife and endorsed to her recommendations per our Pharmacist, Malena Peer. Advised the pts wife that the pt needs to get in to see his PCP, to evaluate other causes of diarrhea to be ruled out.  Per the pts wife, he does not have a PCP at this time, and needs one.  Provided the pts wife [facility] number to call, so that they can direct and help set the pt up with a new PCP, in the area.  Number provided to the wife to call today is [facility] [phone]. Advised her to call now, to get an appt. Advised the pts wife to have him continue his cardiac regimen, and follow-up with Dr. Salena Saner as scheduled for this Friday 09/07/20.  Wife verbalized understanding and agrees with this plan. Wife states she will call [facility] number now, to get assistance with getting the pt an appt to establish with a new PCP. Will send this message to Dr. Royann Shivers and primary covering RN, as a general FYI.

## 2020-09-03 NOTE — Telephone Encounter (Signed)
Nothing really stands out as being a for sure culprit. All of these medications are very important for his heart health. He should have other causes of diarrhea ruled out- suggest he see his PCP or GI if he has a GI docotr. Needs to discuss with Dr. Salena Saner.

## 2020-09-03 NOTE — Telephone Encounter (Signed)
Pt c/o medication issue:  1. Name of Medication: BRILINTA 90 MG TABS tablet carvedilol (COREG) 12.5 MG tablet rosuvastatin (CRESTOR) 20 MG tablet thiamine 100 MG tablet 2. How are you currently taking this medication (dosage and times per day)? As directed  3. Are you having a reaction (difficulty breathing--STAT)? Diarrhea   4. What is your medication issue? Pt has had diarrhea since he has been taking these meds.

## 2020-09-07 ENCOUNTER — Ambulatory Visit: Payer: Managed Care, Other (non HMO) | Admitting: Cardiovascular Disease

## 2020-09-07 ENCOUNTER — Other Ambulatory Visit: Payer: Self-pay | Admitting: *Deleted

## 2020-09-07 ENCOUNTER — Other Ambulatory Visit: Payer: Self-pay

## 2020-09-07 ENCOUNTER — Encounter: Payer: Self-pay | Admitting: Cardiovascular Disease

## 2020-09-07 VITALS — BP 152/98 | HR 94 | Ht 71.0 in | Wt 176.8 lb

## 2020-09-07 DIAGNOSIS — I25111 Atherosclerotic heart disease of native coronary artery with angina pectoris with documented spasm: Secondary | ICD-10-CM

## 2020-09-07 DIAGNOSIS — I6522 Occlusion and stenosis of left carotid artery: Secondary | ICD-10-CM | POA: Diagnosis not present

## 2020-09-07 MED ORDER — METOPROLOL TARTRATE 50 MG PO TABS: 50.0000 mg | ORAL_TABLET | Freq: Two times a day (BID) | ORAL | 3 refills | Status: DC

## 2020-09-07 NOTE — Pre-Procedure Instructions (Signed)
Surgical Instructions:    Your procedure is scheduled on Thursday, 09/13/20 (07:30 AM- 09:30 AM).   Report to [facility] Main Entrance "A" at 05:30 A.M., then check in with the Admitting office.  Call this number if you have any questions prior to, or have any problems the morning of surgery:  [phone]    Remember:  Do not eat or drink after midnight the night before your surgery.     Take these medicines the morning of surgery with A SIP OF WATER: busPIRone (BUSPAR) metoprolol tartrate (LOPRESSOR)   IF NEEDED: ALPRAZolam (XANAX) nitroGLYCERIN (NITROLINGUAL) spray nitroGLYCERIN (NITROSTAT) tablet   Follow your surgeon's instructions concerning aspirin and BRILINTA.    Special instructions:   [place]- Preparing For Surgery  Before surgery, you can play an important role. Because skin is not sterile, your skin needs to be as free of germs as possible. You can reduce the number of germs on your skin by washing with CHG (chlorahexidine gluconate) Soap before surgery.  CHG is an antiseptic cleaner which kills germs and bonds with the skin to continue killing germs even after washing.    Oral Hygiene is also important to reduce your risk of infection.  Remember - BRUSH YOUR TEETH THE MORNING OF SURGERY WITH YOUR REGULAR TOOTHPASTE  Please do not use if you have an allergy to CHG or antibacterial soaps. If your skin becomes reddened/irritated stop using the CHG.  Do not shave (including legs and underarms) for at least 48 hours prior to first CHG shower. It is OK to shave your face.  Please follow these instructions carefully.   1. Shower the NIGHT BEFORE SURGERY and the MORNING OF SURGERY  2. If you chose to wash your hair, wash your hair first as usual with your normal shampoo.  3. After you shampoo, rinse your hair and body thoroughly to remove the shampoo.  4. Wash Face and genitals (private parts) with your normal soap.   5. Use CHG Soap as you would any other  liquid soap. You can apply CHG directly to the skin and wash gently with a scrungie or a clean washcloth.   6. Apply the CHG Soap to your body ONLY FROM THE NECK DOWN.  Do not use on open wounds or open sores. Avoid contact with your eyes, ears, mouth and genitals (private parts). Wash Face and genitals (private parts)  with your normal soap.   7. Wash thoroughly, paying special attention to the area where your surgery will be performed.  8. Thoroughly rinse your body with warm water from the neck down.  9. DO NOT shower/wash with your normal soap after using and rinsing off the CHG Soap.  10. Pat yourself dry with a CLEAN TOWEL.  11. Wear CLEAN PAJAMAS to bed the night before surgery.  12. Place CLEAN SHEETS on your bed the night before your surgery.  13. DO NOT SLEEP WITH PETS.   Day of Surgery: SHOWER with CHG soap. Brush your teeth WITH YOUR REGULAR TOOTHPASTE. Wear Clean/Comfortable clothing the morning of surgery. Do not apply any deodorants/lotions.   Do not wear jewelry, make up, or nail polish. Do not shave 48 hours prior to surgery.  Men may shave face and neck.  Do NOT Smoke (Tobacco/Vaping) or drink Alcohol 24 hours prior to your procedure. Do not bring valuables to the hospital. [facility] is not responsible for any belongings or valuables.  If you use a CPAP at night, you may bring all equipment for  your overnight stay.   Contacts, glasses, or dentures may not be worn into surgery, please bring cases for these belongings.   For patients admitted to the hospital, discharge time will be determined by your treatment team.   Patients discharged the day of surgery will not be allowed to drive home, and someone needs to stay with them for 24 hours.    Please read over the following fact sheets that you were given.

## 2020-09-07 NOTE — Patient Instructions (Addendum)
Medication Instructions:  STOP the Carvedilol START Metoprolol Tartrate 50 mg twice daily  *If you need a refill on your cardiac medications before your next appointment, please call your pharmacy*   Lab Work: None ordered If you have labs (blood work) drawn today and your tests are completely normal, you will receive your results only by: Marland Kitchen MyChart Message (if you have MyChart) OR . A paper copy in the mail If you have any lab test that is abnormal or we need to change your treatment, we will call you to review the results.   Testing/Procedures: None ordered   Follow-Up: At [facility], you and your health needs are our priority.  As part of our continuing mission to provide you with exceptional heart care, we have created designated Provider Care Teams.  These Care Teams include your primary Cardiologist (physician) and Advanced Practice Providers (APPs -  Physician Assistants and Nurse Practitioners) who all work together to provide you with the care you need, when you need it.  We recommend signing up for the patient portal called "MyChart".  Sign up information is provided on this After Visit Summary.  MyChart is used to connect with patients for Virtual Visits (Telemedicine).  Patients are able to view lab/test results, encounter notes, upcoming appointments, etc.  Non-urgent messages can be sent to your provider as well.   To learn more about what you can do with MyChart, go to [email or web address].    Your next appointment:   2-3 month(s)  The format for your next appointment:   In Person  Provider:   Thurmon Fair, MD

## 2020-09-07 NOTE — Patient Outreach (Addendum)
[facility]) Care Management  09/07/2020  MANNING LUNA 1960/11/28 [id number]  [facility] follow up outreach to [facility] stroke referred patient    Mr MACDONALD RIGOR was referred to [facility] on 08/30/20 for  red on EMMI alert reasons:  Questions/problems with meds? Yes Smoked or been around smoke? Yes Not on APL  Insurance: [employer] managed  [facility] admissions x  1 ED visits x 1 in the last 6 months  Admission 08/21/20-08/22/20 small acute ischemic stroke in left centrum semiovale-60% left carotid stenosis, CAD, primary hypertension, mixed hyperlipidemia 08/05/20- 08/07/20 [facility] admission for STEMI   Outreach # 2 successful at [phone] Reached wife, Susette Racer who confirms she is his wife  Explains Mr Berling generally does not answer the phone related to his PTSD She is able to verify HIPAA identifiers [place] Management RN reviewed and addressed red alertwith her  Consent: [facility] RN CM reviewed [facility] services with her. She gave verbal consent for services [facility] telephonic RN CM.   Advised wife that there will be further automated EMMI-post discharge calls to assess how the patient is doing following the recent hospitalization Advised that another call may be received from a nurse if any of their responses were abnormal. She voiced understanding and was appreciative of f/u call.   EMMI follow up assessment  During the initial outreach Mr Lovelady was experiencing diarrhea Mrs Llerena reports they were informed that the diarrhea was not related to medications per MD they tried establishing pcp services with Pt and wife continued to suspect the new statin Crestor was causing diarrhea  Mrs Gauger confirms Mr Ciullo stopped the statin and within a day the diarrhea stopped   Today Pt had follow up visit with cardiologist and the discontinuation of the statin was discussed. The statin was not restarted and they will see how pt does without it   Pt is scheduled  for a 09/13/20 left transcarotid artery revascularization (TCAR) with Dr Stanford Breed to assist with Coronary Artery Disease (CAD) and stroke prevention     Pt was sent via confirmed e-mail address EMMI for managing your cholesterol: overview and low cholesterol, saturated fat, and trans fat diet   Plan [employer] Managed covered Patient agrees to  [facility] case closure with option to follow up prn with [facility] RN CM   Goals Addressed              This Visit's Progress     Patient Stated   .  COMPLETED: [facility] ) Manage My Medicine (pt-stated)   On track     Timeframe:  Short-Term Goal Priority:  High Start Date:                08/31/20             Expected End Date:             09/30/20          Follow Up Date 09/07/20  - call if I am sick and can't take my medicine - keep a list of all the medicines I take; vitamins and herbals too  Call if have side effects from any medication Take food (protein) prior to medicines with known GI side effects    Why is this important?   . These steps will help you keep on track with your medicines.   Notes: 09/07/20 stopped statin diarrhea resolved and consulted with cardiology on 09/07/20 Statin remains discontinued Met goal case  closure as not on APL- will outreach if further needs  08/31/20  noting possible side effects suspected from medicines especially diarrhea, fatigue and further generalized weakness since hospital discharge Reports episodes with 3 or more stools in a day Reports Mr Keimig does not eat many meats primarily fish  He does not tolerate peanut butter or milk products wt from 181 to 174 from February to April 2022  He has been recently started on Brilinta, thiamine, multivitamin Restarted on previously discontinued aspirin and Crestor Discussed an intake of a protein pt is able to tolerate prior to intake of medicine    .  COMPLETED: [facility]) Keep or Improve My Strength-Stroke (pt-stated)   On track     Timeframe:  Short-Term Goal Priority:  High Start  Date:             08/31/20                Expected End Date:             09/30/20          Follow Up Date 09/07/20  - arrange in-home help services - eat healthy to increase strength - increase activity or exercise time a little every week   Notes:  09/07/20 stopped statin diarrhea resolved and consulted with cardiology on 09/07/20 Statin remains discontinued Met goal case closure as not on APL- will outreach if further needs  08/31/20 noting possible side effects suspected from medicines especially diarrhea, fatigue and further generalized weakness since hospital discharge Reports episodes with 3 or more stools in a day does not eat many meats primarily fish  He does not tolerate peanut butter or milk products wt from 181 to 174 from February to April 2022  had not needed therapy session upon discharge but are considering sessions [facility] RN CM discussed with her that after hospital discharge the attending providers generally recommend a pcp to continue community level care such as home health or outpatient therapy services will establish pcp care services soon        Bliss L. Lavina Hamman, RN, BSN, [place] Coordinator Office number [phone] [facility] number [phone] Fax number [phone]

## 2020-09-10 ENCOUNTER — Other Ambulatory Visit: Payer: Self-pay

## 2020-09-10 ENCOUNTER — Encounter (HOSPITAL_COMMUNITY): Payer: Self-pay

## 2020-09-10 ENCOUNTER — Encounter (HOSPITAL_COMMUNITY)
Admission: RE | Admit: 2020-09-10 | Discharge: 2020-09-10 | Disposition: A | Discharge status: Home / Self Care | Payer: Managed Care, Other (non HMO) | Source: Ambulatory Visit | Attending: Vascular Surgery | Admitting: Vascular Surgery

## 2020-09-10 DIAGNOSIS — Z01812 Encounter for preprocedural laboratory examination: Secondary | ICD-10-CM | POA: Insufficient documentation

## 2020-09-10 DIAGNOSIS — Z20822 Contact with and (suspected) exposure to covid-19: Secondary | ICD-10-CM | POA: Insufficient documentation

## 2020-09-10 HISTORY — DX: Essential (primary) hypertension: I10

## 2020-09-10 HISTORY — DX: Angina pectoris, unspecified: I20.9

## 2020-09-10 HISTORY — DX: Myoneural disorder, unspecified: G70.9

## 2020-09-10 LAB — CBC
HCT: 47.9 % (ref 39.0–52.0)
Hemoglobin: 16.3 g/dL (ref 13.0–17.0)
MCH: 34.8 pg — ABNORMAL HIGH (ref 26.0–34.0)
MCHC: 34 g/dL (ref 30.0–36.0)
MCV: 102.4 fL — ABNORMAL HIGH (ref 80.0–100.0)
Platelets: 168 10*3/uL (ref 150–400)
RBC: 4.68 MIL/uL (ref 4.22–5.81)
RDW: 13.8 % (ref 11.5–15.5)
WBC: 8.6 10*3/uL (ref 4.0–10.5)
nRBC: 0 % (ref 0.0–0.2)

## 2020-09-10 LAB — SURGICAL PCR SCREEN
MRSA, PCR: NEGATIVE
Staphylococcus aureus: NEGATIVE

## 2020-09-10 LAB — COMPREHENSIVE METABOLIC PANEL
ALT: 52 U/L — ABNORMAL HIGH (ref 0–44)
AST: 55 U/L — ABNORMAL HIGH (ref 15–41)
Albumin: 4 g/dL (ref 3.5–5.0)
Alkaline Phosphatase: 61 U/L (ref 38–126)
Anion gap: 10 (ref 5–15)
BUN: 11 mg/dL (ref 6–20)
CO2: 20 mmol/L — ABNORMAL LOW (ref 22–32)
Calcium: 9.1 mg/dL (ref 8.9–10.3)
Chloride: 107 mmol/L (ref 98–111)
Creatinine, Ser: 1.12 mg/dL (ref 0.61–1.24)
GFR, Estimated: >60 mL/min (ref >60)
Glucose, Bld: 101 mg/dL — ABNORMAL HIGH (ref 70–99)
Potassium: 4.4 mmol/L (ref 3.5–5.1)
Sodium: 137 mmol/L (ref 135–145)
Total Bilirubin: 0.9 mg/dL (ref 0.3–1.2)
Total Protein: 6.9 g/dL (ref 6.5–8.1)

## 2020-09-10 LAB — TYPE AND SCREEN
ABO/RH(D): A POS
Antibody Screen: NEGATIVE

## 2020-09-10 LAB — URINALYSIS, ROUTINE W REFLEX MICROSCOPIC
Bilirubin Urine: NEGATIVE
Glucose, UA: NEGATIVE mg/dL
Hgb urine dipstick: NEGATIVE
Ketones, ur: NEGATIVE mg/dL
Nitrite: NEGATIVE
Protein, ur: NEGATIVE mg/dL
Specific Gravity, Urine: 1.012 (ref 1.005–1.030)
pH: 5 (ref 5.0–8.0)

## 2020-09-10 LAB — PROTIME-INR
INR: 0.9 (ref 0.8–1.2)
Prothrombin Time: 12.4 seconds (ref 11.4–15.2)

## 2020-09-10 LAB — SARS CORONAVIRUS 2 (TAT 6-24 HRS): SARS Coronavirus 2: NEGATIVE

## 2020-09-10 LAB — APTT: aPTT: 32 seconds (ref 24–36)

## 2020-09-10 NOTE — Progress Notes (Signed)
Notified office of abnormal U/A result.

## 2020-09-10 NOTE — Progress Notes (Addendum)
PCP -  denies Cardiologist -  Croitoru, Mihai     PPM/ICD - denies   Chest x-ray - n/a EKG - 08/24/20 Stress Test - over 10 years ago (2008)- normal per patient ECHO - carotid ultrasound but no heart ultrasound per patient Cardiac Cath - 08/06/20  Sleep Study - denies    Continue brilinta and aspirin through the day of surgery per patient instructions from dr Butch Penny office.   ERAS Protcol -no   COVID TEST- 09/10/20   Anesthesia review: yes, cardiologist has requested all records from [place] in [place] with much difficulty. He has all the notes and reports but no CD yet. I have requested records once more as patient had a heart attack and cardiac catherization with stents done at [facility] recently   Patient denies shortness of breath, fever, cough and chest pain at PAT appointment   All instructions explained to the patient, with a verbal understanding of the material. Patient agrees to go over the instructions while at home for a better understanding. Patient also instructed to self quarantine after being tested for COVID-19. The opportunity to ask questions was provided.

## 2020-09-11 ENCOUNTER — Other Ambulatory Visit (HOSPITAL_COMMUNITY): Payer: Managed Care, Other (non HMO)

## 2020-09-12 NOTE — Progress Notes (Signed)
Cardiology Office Note:    Date:  09/12/2020   ID:  Corey Salas, DOB [birth year], MRN [id number]  PCP:  [facility]  Cardiologist:  Christell Constant, MD  Advanced Practice Provider:  No care team member to display Electrophysiologist:  None       Referring MD: No ref. provider found   No chief complaint on file.   History of Present Illness:    He was previously seen by Dr. Servando Salina, but was in the process of transitioning to Dr. Izora Ribas, when an urgent consult was requested by the Anesthesiology service, for "clearance" prior to planned carotid procedure in 1 week.  Corey Salas is a 60 y.o. 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  Since his last appointment he has not had any problems with either new focal neurological complaints or cardiovascular symptoms.  He denies angina.  He developed a lot of diarrhea and stopped taking medications including the carvedilol and rosuvastatin.  The diarrhea stopped promptly after that.  He has not been on any beta-blocker for about a week.  He reports a history of "hives" when taking atorvastatin, but did not have any skin reaction with rosuvastatin.  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  He is scheduled to have his carotid procedure (TCAR w Dr. Lenell Antu) on Thursday, May 12.  I called the Cath Lab in [facility] again requesting the images from his  cardiac catheterization report.  I was told that their system is still down and they are unable to bring CDs.  Its been a month now.  [Addendum: TRIED again on 09-11-2020 and had the same response]  His initial presentation for coronary disease was in 2006 at age 19, when he had a myocardial infarction with placement of a drug-eluting stent to the right coronary artery (Taxus 3.5x16).  In 2009 he had stents placed to the RCA (Taxus 2.75x12) and LAD (Taxus 3.0x18).  In 2015 he had 2 stents placed while he was living in [place] (Records not available for that procedure).  Most of his cardiology care has been in [facility].  During one of his catheterizations he was noted to have vasospasm and has been diagnosed with Prinzmetal's angina (Dr. Collie Siad).  Most recently, also in [facility], he had an acute inferior myocardial infarction on August 10, 2020 and received a drug-eluting stent to the right coronary artery it was also noted that he had severe disease in the left main coronary artery and multivessel stenoses.  The decision was made to proceed with stenting because he "kept having cardiac arrest on the Cath Lab table".  The plan was for him to undergo subsequent bypass surgery after 5 weeks of dual antiplatelet therapy.  However he now lives,  and his wife works, in [place] so they returned home, with a plan to undergo surgical evaluation here.  He has not had any angina pectoris since the last myocardial infarction.  He was told that his heart muscle was well preserved.  Our  anesthesiology team had access to an echo report that showed "left ventricular systolic function was low normal with EF 50-55% and inferior wall hypokinesis.  There were no significant valvular abnormalities".  Unfortunately, I cannot locate a copy of that report.  We also do not have a report of the cardiac cath procedure, let alone the images.  In the meantime he had an acute ischemic stroke on 08/21/2020, sudden  onset of right-sided weakness, right facial droop, dysarthria and ataxia.  MRI showed a small acute ischemic stroke in the left centrum semiovale.  CT angiography showed a 60% left carotid stenosis.  His neurological symptoms improved rapidly and at this point he only has some mild clumsiness in his right hand.  Cardiology did not see him during that hospitalization.  He was continued on aspirin and Brilinta and statin, scheduled for TCAR (relatively high location of the stenosis; recent MI with need for uninterrupted antiplatelet therapy) on Friday, April 29 with Dr. Lenell Antu.   He was evaluated by the anesthesiology team on 08/23/2020 and they requested cardiology evaluation before proceeding with the planned surgical procedure.   Past medical problems also include a history of GI bleeding from gastritis, more than 10 years ago.  This resolved with temporary discontinuation of aspirin.  He has hypertension that has generally been well treated.  He has a history of erectile dysfunction that responded to sildenafil.  He has a history of benign gynecomastia and stable pulmonary nodules.  He had delayed urticaria and angioedema after his first cardiac catheterization 2006, but this has not recurred on subsequent procedures.  Mr. Cangelosi has a long history of PTSD.  I did not explore the origins of this at today's appointment).  He has also had a lot of reluctance to take medications in general to embark upon preventive health measures.  He only agreed to restart taking statins after his most recent acute MI in early April.  He continues to smoke about a pack of cigarettes a day.  He is never really been able to quit smoking in over 40 years.  In the past he also used to consume alcohol heavily, often consumes a daily sixpack of beer right now.  He has a lot of issues with anxiety.    He did very poorly in the hospital for his recent stroke when he was denied a nicotine patch.  He is originally from the [place], close to [place].  Past Medical History:  Diagnosis Date  . Anginal pain (HCC)   . CAD (coronary artery disease)   . Chest pain   . Dyspnea    08/23/20- "just before my heart attack, it is normal now, unless I I am stressed/"  . Family history of adverse reaction to anesthesia 08/21/2020  . Hypertension   . Neuromuscular disorder (HCC)    neuropathy in both feet  . Neuropathy    "bottom of my feet, they say it is from alcohol, I just drink beer now, not spirits."  . PTSD (post-traumatic stress disorder)   . PTSD (post-traumatic stress disorder)   . STEMI (ST elevation myocardial infarction) (HCC) 08/06/2020  . Stroke (HCC)    08/23/20 slight weakness in right hand - can type and write- "I have to concentrate to keep pen in hand."    Past Surgical History:  Procedure Laterality Date  . APPENDECTOMY    . CORONARY ANGIOPLASTY WITH STENT PLACEMENT     6X  . FRACTURE SURGERY     right  tibia repair    Current Medications: Current Meds  Medication Sig  . ALPRAZolam (XANAX) 1 MG tablet Take half a tablet to one tablet every 8 hours as needed for anxiety.  Marland Kitchen aspirin EC 81 MG tablet Take 1 tablet (81 mg total) by mouth daily. Swallow whole.  Marland Kitchen BRILINTA 90 MG TABS tablet Take 90 mg by mouth 2 (two) times daily.  . busPIRone (BUSPAR) 5 MG tablet Take 5 mg by mouth 2 (two) times daily.  . metoprolol tartrate (LOPRESSOR) 50 MG tablet Take 1 tablet (50 mg total) by mouth 2 (two) times daily.  . Multiple Vitamins-Minerals (CENTRUM SILVER 50+MEN) TABS Take 1 tablet by mouth daily.  . nitroGLYCERIN (NITROLINGUAL) 0.4 MG/SPRAY spray Place 1 spray under the tongue daily as needed for chest pain.  . nitroGLYCERIN (NITROSTAT) 0.4 MG SL tablet Place 0.4 mg under the tongue every 5 (five) minutes as needed for chest pain.  . rosuvastatin (CRESTOR) 20 MG tablet Take 20 mg by mouth daily. (Patient not taking: Reported on 09/07/2020)  . sildenafil (REVATIO) 20 MG tablet Take 20 mg by mouth  daily as needed (ed).  . [DISCONTINUED] carvedilol (COREG) 12.5 MG tablet Take 12.5 mg by mouth 2 (two) times daily.     Allergies:   Patient has no known allergies.   Social History   Socioeconomic History  . Marital status: Married    Spouse name: Not on file  . Number of children: Not on file  . Years of education: Not on file  . Highest education level: Not on file  Occupational History  . Not on file  Tobacco Use  . Smoking status: Current Every Day Smoker    Packs/day: 1.00  . Smokeless tobacco: Never Used  . Tobacco comment: Patient rolls his cigarettes- equaliavant to 1 pakg a day.  Vaping Use  . Vaping Use: Never used  Substance and Sexual Activity  . Alcohol use: Yes    Alcohol/week: 48.0 standard drinks    Types: 48 Cans of beer per week  . Drug use: Never  . Sexual activity: Not on file  Other Topics Concern  . Not on file  Social History Narrative  . Not on file   Social Determinants of Health   Financial Resource Strain: Not on file  Food Insecurity: No Food Insecurity  . Worried About Programme researcher, broadcasting/film/video in the Last Year: Never true  . Ran Out of Food in the Last Year: Never true  Transportation Needs: No Transportation Needs  . Lack of Transportation (Medical): No  . Lack of Transportation (Non-Medical): No  Physical Activity: Not on file  Stress: No Stress Concern Present  . Feeling of Stress : Only a little  Social Connections: Not on file     Family History: The patient's family history includes Heart attack in his father and mother.  His father died of heart disease at age 55.  ROS:   Please see the history of present illness.     All other systems reviewed and are negative.  EKGs/Labs/Other Studies Reviewed:    The following studies were reviewed today:   Carotid duplex ultrasound 08/22/2020 Right Carotid: Velocities in the right ICA are consistent with a 1-39% stenosis.  Left Carotid: Velocities in the left ICA are consistent with a  60-79% stenosis.  Vertebrals: Bilateral vertebral arteries demonstrate antegrade flow.   TTE 08/06/2020 [facility] [place]): Summary: Normal mitral valve structure and function. Trace mitral valve regurgitation. Normal aortic  valve structure and function. No aortic regurgitation. No aortic stenosis. Normal tricuspid valve structure and function. Trace tricuspid valve regurgitation. Normal right ventricular systolic pressure. Normal pulmonic valve structure and function. No evidence of any pulmonic regurgitation. The left atrium is normal. Low normal left ventricular systolic function. There are left ventricular regional wall motion abnormalities.  See appended scoring diagram. Normal left ventricular diastolic function. Normal left ventricular wall thickness. The left ventricular ejection fraction by visual estimation is 50 to 55%. The right atrium is normal. Normal right ventricular size, structure and function. Aortic root appearance and dimensions are normal. No evidence of pericardial effusion.  EKG:  EKG is not ordered today.  The ekg ordered 08/24/2020 demonstrates normal sinus rhythm, T wave inversion in the inferior leads without much in the way of Q waves.  The T wave inversions are slightly more prominent, but overall not much change from the previous tracing from July 02, 2020.  Note that there was no electrocardiogram performed during his hospitalization for stroke.  A couple of saved monitor strips showed normal sinus rhythm.  Recent Labs: 09/10/2020: ALT 52; BUN 11; Creatinine, Ser 1.12; Hemoglobin 16.3; Platelets 168; Potassium 4.4; Sodium 137  Recent Lipid Panel    Component Value Date/Time   CHOL 143 08/22/2020 0454   TRIG 165 (H) 08/22/2020 0454   HDL 65 08/22/2020 0454   CHOLHDL 2.2 08/22/2020 0454   VLDL 33 08/22/2020 0454   LDLCALC 45 08/22/2020 0454     Risk Assessment/Calculations:       Physical Exam:    VS:  BP (!)  152/98 (BP Location: Left Arm, Patient Position: Sitting, Cuff Size: Normal)   Pulse 94   Ht 5\' 11"  (1.803 m)   Wt 176 lb 12.8 oz (80.2 kg)   BMI 24.66 kg/m     Wt Readings from Last 3 Encounters:  09/10/20 178 lb 7 oz (80.9 kg)  09/07/20 176 lb 12.8 oz (80.2 kg)  08/24/20 174 lb 12.8 oz (79.3 kg)      General: Alert, oriented x3, no distress, as always rather anxious Head: no evidence of trauma, PERRL, EOMI, no exophtalmos or lid lag, no myxedema, no xanthelasma; normal ears, nose and oropharynx Neck: normal jugular venous pulsations and no hepatojugular reflux; brisk carotid pulses without delay and no carotid bruits Chest: clear to auscultation, no signs of consolidation by percussion or palpation, normal fremitus, symmetrical and full respiratory excursions Cardiovascular: normal position and quality of the apical impulse, regular rhythm, normal first and second heart sounds, no murmurs, rubs or gallops Abdomen: no tenderness or distention, no masses by palpation, no abnormal pulsatility or arterial bruits, normal bowel sounds, no hepatosplenomegaly Extremities: no clubbing, cyanosis or edema; 2+ radial, ulnar and brachial pulses bilaterally; 2+ right femoral, posterior tibial and dorsalis pedis pulses; 2+ left femoral, posterior tibial and dorsalis pedis pulses; no subclavian or femoral bruits Neurological: grossly nonfocal Psych: Normal mood and affect   ASSESSMENT:    No diagnosis found. PLAN:    In order of problems listed above:  1. CAD s/p recent IWMI/DES-RCA: Mr. Keimig reportedly has severe left main stenosis and other coronary lesions that require surgical revascularization (we do not have the Cath Lab report for the Cath Lab images despite repeated attempts on my part and by the staff from the Cath Lab).  Ideally, dual antiplatelet therapy should be continued for 12 months and any interruption should be avoided in the first 3 months following placement of the drug-eluting  stent.  However, TCAR can be performed while he is still on dual antiplatelet therapy, whereas bypass surgery cannot.  In addition, the risk of recurrent stroke during bypass surgery would be very high at this juncture.  He has not had any angina in the months that has passed since his myocardial infarction.  Continue aspirin, Brilinta, carvedilol, rosuvastatin.  Unfortunately, he stopped his beta-blocker very abruptly which is not a great situation.  Risk of beta-blocker rebound.  It is important for him to be back at least on a modest amount of beta-blocker before his carotid surgery.  Started metoprolol 50 mg twice daily today. 2. LICA stenosis with recent L hemispheric ischemic stroke: Risk of recurrent stroke events is substantial in the absence of revascularization.  For TCAR tomorrow.  Important to avoid large hemodynamic swings in the perioperative period due to the presence of left main stenosis.  The risk of coronary complications is elevated, but there is no optimal solution to his complex situation. 3. History of vasospastic angina: Apparently this was confirmed during cardiac catheterization.  He has been using a nitroglycerin spray off and on for years.  Records show that in the past he was treated with amlodipine and diltiazem, but he is no longer on any calcium channel blockers. 4. Hypercholesterolemia: He has stopped the rosuvastatin.  I recommended starting him on PCSK9 inhibitors after his surgery.  He is worried about taking these medications due to a reported association with "myalgic encephalomyelitis "which is prevalent in his family.  I have found no evidence of such an association in my brief review of the literature. Discussed inclisiran, but he is skeptical. Pointed out the galloping pace of his vascular problems and the importance of finding a long term solution to reduce risk. 5. Smoking: important to quit. OK to use nicotine patch if this helps. 6. HTN: Blood pressure control was  excellent while he was taking carvedilol, but now both his systolic and diastolic blood pressures are quite high, possibly with a component of beta-blocker rebound.  Metoprolol started today.  This should not be interrupted in the perioperative period. 7. PTSD/anxiety: Although we did not explore the roots of this in any detail today, it has clearly led him to make impulsive decisions regarding his medical care, sometimes to his detriment, in the past.  We should assist him with substitution treatment for nicotine withdrawal and benzodiazepines as needed during his hospitalization.  I am not sure whether or not he has findings to suggest alcohol dependency, but this should be considered if he becomes agitated or anxious. 8. ED: Sildenafil is on his medication list.  He understands that he should never use nitroglycerin or nitroglycerin containing products within 24 hours of taking this medication.        Medication Adjustments/Labs and Tests Ordered: Current medicines are reviewed at length with the patient today.  Concerns regarding medicines are outlined above.  No orders of the defined types were placed in this encounter.  Meds ordered this encounter  Medications  . metoprolol tartrate (LOPRESSOR) 50 MG tablet    Sig: Take 1 tablet (50 mg total) by mouth 2 (two) times daily.    Dispense:  180 tablet    Refill:  3    Discontinue Carvedilol    Patient Instructions  Medication Instructions:  STOP the Carvedilol START Metoprolol Tartrate 50 mg twice daily  *If you need a refill on your cardiac medications before your next appointment, please call your pharmacy*   Lab Work: None ordered  If you have labs (blood work) drawn today and your tests are completely normal, you will receive your results only by: Marland Kitchen. MyChart Message (if you have MyChart) OR . A paper copy in the mail If you have any lab test that is abnormal or we need to change your treatment, we will call you to review the  results.   Testing/Procedures: None ordered   Follow-Up: At [facility], you and your health needs are our priority.  As part of our continuing mission to provide you with exceptional heart care, we have created designated Provider Care Teams.  These Care Teams include your primary Cardiologist (physician) and Advanced Practice Providers (APPs -  Physician Assistants and Nurse Practitioners) who all work together to provide you with the care you need, when you need it.  We recommend signing up for the patient portal called "MyChart".  Sign up information is provided on this After Visit Summary.  MyChart is used to connect with patients for Virtual Visits (Telemedicine).  Patients are able to view lab/test results, encounter notes, upcoming appointments, etc.  Non-urgent messages can be sent to your provider as well.   To learn more about what you can do with MyChart, go to [email or web address].    Your next appointment:   2-3 month(s)  The format for your next appointment:   In Person  Provider:   Thurmon FairMihai Avey Mcmanamon, MD     Signed, Thurmon FairMihai Celest Reitz, MD  09/12/2020 10:59 AM     [facility]

## 2020-09-13 ENCOUNTER — Encounter (HOSPITAL_COMMUNITY)
Admission: RE | Disposition: A | Discharge status: Home / Self Care | Payer: Self-pay | Source: Home / Self Care | Attending: Vascular Surgery

## 2020-09-13 ENCOUNTER — Other Ambulatory Visit: Payer: Self-pay

## 2020-09-13 ENCOUNTER — Inpatient Hospital Stay (HOSPITAL_COMMUNITY)
Admission: RE | Admit: 2020-09-13 | Discharge: 2020-09-14 | DRG: 036 | Disposition: A | Discharge status: Home / Self Care | Payer: Managed Care, Other (non HMO) | Attending: Vascular Surgery | Admitting: Vascular Surgery

## 2020-09-13 ENCOUNTER — Inpatient Hospital Stay (HOSPITAL_COMMUNITY): Payer: Managed Care, Other (non HMO) | Admitting: Physician Assistant

## 2020-09-13 ENCOUNTER — Inpatient Hospital Stay (HOSPITAL_COMMUNITY): Payer: Managed Care, Other (non HMO)

## 2020-09-13 ENCOUNTER — Encounter (HOSPITAL_COMMUNITY): Payer: Self-pay | Admitting: Vascular Surgery

## 2020-09-13 DIAGNOSIS — E785 Hyperlipidemia, unspecified: Secondary | ICD-10-CM | POA: Diagnosis present

## 2020-09-13 DIAGNOSIS — R29898 Other symptoms and signs involving the musculoskeletal system: Secondary | ICD-10-CM | POA: Diagnosis present

## 2020-09-13 DIAGNOSIS — I10 Essential (primary) hypertension: Secondary | ICD-10-CM | POA: Diagnosis present

## 2020-09-13 DIAGNOSIS — I6522 Occlusion and stenosis of left carotid artery: Secondary | ICD-10-CM

## 2020-09-13 DIAGNOSIS — F17200 Nicotine dependence, unspecified, uncomplicated: Secondary | ICD-10-CM | POA: Diagnosis present

## 2020-09-13 DIAGNOSIS — I69398 Other sequelae of cerebral infarction: Secondary | ICD-10-CM

## 2020-09-13 DIAGNOSIS — Z7982 Long term (current) use of aspirin: Secondary | ICD-10-CM | POA: Diagnosis not present

## 2020-09-13 DIAGNOSIS — Z20822 Contact with and (suspected) exposure to covid-19: Secondary | ICD-10-CM | POA: Diagnosis present

## 2020-09-13 DIAGNOSIS — Z7902 Long term (current) use of antithrombotics/antiplatelets: Secondary | ICD-10-CM

## 2020-09-13 DIAGNOSIS — Z955 Presence of coronary angioplasty implant and graft: Secondary | ICD-10-CM

## 2020-09-13 DIAGNOSIS — I252 Old myocardial infarction: Secondary | ICD-10-CM | POA: Diagnosis not present

## 2020-09-13 DIAGNOSIS — Z8249 Family history of ischemic heart disease and other diseases of the circulatory system: Secondary | ICD-10-CM | POA: Diagnosis not present

## 2020-09-13 DIAGNOSIS — I2510 Atherosclerotic heart disease of native coronary artery without angina pectoris: Secondary | ICD-10-CM | POA: Diagnosis present

## 2020-09-13 DIAGNOSIS — R001 Bradycardia, unspecified: Secondary | ICD-10-CM | POA: Diagnosis present

## 2020-09-13 HISTORY — DX: Cerebral infarction, unspecified: I63.9

## 2020-09-13 HISTORY — DX: Polyneuropathy, unspecified: G62.9

## 2020-09-13 HISTORY — DX: Dyspnea, unspecified: R06.00

## 2020-09-13 HISTORY — PX: TRANSCAROTID ARTERY REVASCULARIZATIONÂ: SHX6778

## 2020-09-13 LAB — ABO/RH: ABO/RH(D): A POS

## 2020-09-13 LAB — POCT ACTIVATED CLOTTING TIME: Activated Clotting Time: 350 seconds

## 2020-09-13 SURGERY — TRANSCAROTID ARTERY REVASCULARIZATION (TCAR)
Anesthesia: General | Site: Neck | Laterality: Left

## 2020-09-13 MED ORDER — HEMOSTATIC AGENTS (NO CHARGE) OPTIME
TOPICAL | Status: DC | PRN
  Administered 2020-09-13: 1 via TOPICAL

## 2020-09-13 MED ORDER — FENTANYL CITRATE (PF) 250 MCG/5ML IJ SOLN
INTRAMUSCULAR | Status: DC | PRN
  Administered 2020-09-13: 100 ug via INTRAVENOUS

## 2020-09-13 MED ORDER — AMISULPRIDE (ANTIEMETIC) 5 MG/2ML IV SOLN
INTRAVENOUS | Status: AC
  Filled 2020-09-13: qty 2

## 2020-09-13 MED ORDER — CHLORHEXIDINE GLUCONATE 0.12 % MT SOLN
15.0000 mL | Freq: Once | OROMUCOSAL | Status: AC
  Administered 2020-09-13: 15 mL via OROMUCOSAL
  Filled 2020-09-13: qty 15

## 2020-09-13 MED ORDER — PROPOFOL 10 MG/ML IV BOLUS
INTRAVENOUS | Status: DC | PRN
  Administered 2020-09-13: 40 mg via INTRAVENOUS

## 2020-09-13 MED ORDER — TICAGRELOR 90 MG PO TABS
90.0000 mg | ORAL_TABLET | Freq: Two times a day (BID) | ORAL | Status: DC
  Administered 2020-09-13 – 2020-09-14 (×2): 90 mg via ORAL
  Filled 2020-09-13 (×2): qty 1

## 2020-09-13 MED ORDER — ACETAMINOPHEN 500 MG PO TABS: 1000.0000 mg | ORAL_TABLET | Freq: Once | ORAL | Status: DC | PRN

## 2020-09-13 MED ORDER — CHLORHEXIDINE GLUCONATE CLOTH 2 % EX PADS: 6.0000 | MEDICATED_PAD | Freq: Once | CUTANEOUS | Status: DC

## 2020-09-13 MED ORDER — AMISULPRIDE (ANTIEMETIC) 5 MG/2ML IV SOLN
5.0000 mg | Freq: Once | INTRAVENOUS | Status: AC
  Administered 2020-09-13: 5 mg via INTRAVENOUS

## 2020-09-13 MED ORDER — SODIUM CHLORIDE 0.9 % IV SOLN: INTRAVENOUS | Status: DC

## 2020-09-13 MED ORDER — MIDAZOLAM HCL 2 MG/2ML IJ SOLN
INTRAMUSCULAR | Status: AC
  Filled 2020-09-13: qty 2

## 2020-09-13 MED ORDER — PHENOL 1.4 % MT LIQD: 1.0000 | OROMUCOSAL | Status: DC | PRN

## 2020-09-13 MED ORDER — PANTOPRAZOLE SODIUM 40 MG PO TBEC
40.0000 mg | DELAYED_RELEASE_TABLET | Freq: Every day | ORAL | Status: DC
  Administered 2020-09-13 – 2020-09-14 (×2): 40 mg via ORAL
  Filled 2020-09-13 (×2): qty 1

## 2020-09-13 MED ORDER — METOPROLOL TARTRATE 50 MG PO TABS
50.0000 mg | ORAL_TABLET | Freq: Two times a day (BID) | ORAL | Status: DC
  Administered 2020-09-13 – 2020-09-14 (×3): 50 mg via ORAL
  Filled 2020-09-13 (×3): qty 1

## 2020-09-13 MED ORDER — BISACODYL 5 MG PO TBEC
5.0000 mg | DELAYED_RELEASE_TABLET | Freq: Every day | ORAL | Status: DC | PRN
Start: 2020-09-13 — End: 2020-09-14

## 2020-09-13 MED ORDER — HEPARIN SODIUM (PORCINE) 1000 UNIT/ML IJ SOLN
INTRAMUSCULAR | Status: DC | PRN
  Administered 2020-09-13: 8000 [IU] via INTRAVENOUS

## 2020-09-13 MED ORDER — ALPRAZOLAM 0.5 MG PO TABS: 0.5000 mg | ORAL_TABLET | Freq: Three times a day (TID) | ORAL | Status: DC | PRN

## 2020-09-13 MED ORDER — METOPROLOL TARTRATE 5 MG/5ML IV SOLN: 2.0000 mg | INTRAVENOUS | Status: DC | PRN

## 2020-09-13 MED ORDER — MIDAZOLAM HCL 2 MG/2ML IJ SOLN
INTRAMUSCULAR | Status: DC | PRN
  Administered 2020-09-13 (×2): 1 mg via INTRAVENOUS

## 2020-09-13 MED ORDER — NICOTINE 21 MG/24HR TD PT24
21.0000 mg | MEDICATED_PATCH | Freq: Every day | TRANSDERMAL | Status: DC
  Administered 2020-09-13 – 2020-09-14 (×2): 21 mg via TRANSDERMAL
  Filled 2020-09-13 (×2): qty 1

## 2020-09-13 MED ORDER — LIDOCAINE HCL (PF) 1 % IJ SOLN
INTRAMUSCULAR | Status: AC
  Filled 2020-09-13: qty 30

## 2020-09-13 MED ORDER — METOPROLOL TARTRATE 50 MG PO TABS
ORAL_TABLET | ORAL | Status: AC
  Filled 2020-09-13: qty 1

## 2020-09-13 MED ORDER — SUGAMMADEX SODIUM 200 MG/2ML IV SOLN
INTRAVENOUS | Status: DC | PRN
  Administered 2020-09-13: 200 mg via INTRAVENOUS

## 2020-09-13 MED ORDER — 0.9 % SODIUM CHLORIDE (POUR BTL) OPTIME
TOPICAL | Status: DC | PRN
  Administered 2020-09-13: 1000 mL

## 2020-09-13 MED ORDER — POTASSIUM CHLORIDE CRYS ER 20 MEQ PO TBCR: 20.0000 meq | EXTENDED_RELEASE_TABLET | Freq: Every day | ORAL | Status: DC | PRN

## 2020-09-13 MED ORDER — PHENYLEPHRINE HCL-NACL 10-0.9 MG/250ML-% IV SOLN
INTRAVENOUS | Status: DC | PRN
  Administered 2020-09-13: 10 ug/min via INTRAVENOUS

## 2020-09-13 MED ORDER — PROPOFOL 10 MG/ML IV BOLUS
INTRAVENOUS | Status: AC
  Filled 2020-09-13: qty 20

## 2020-09-13 MED ORDER — LORAZEPAM 2 MG/ML IJ SOLN
0.5000 mg | Freq: Once | INTRAMUSCULAR | Status: AC
  Administered 2020-09-13: 0.5 mg via INTRAVENOUS

## 2020-09-13 MED ORDER — ALPRAZOLAM 1 MG PO TABS: 1.0000 mg | ORAL_TABLET | Freq: Three times a day (TID) | ORAL | Status: DC | PRN

## 2020-09-13 MED ORDER — LIDOCAINE 2% (20 MG/ML) 5 ML SYRINGE
INTRAMUSCULAR | Status: DC | PRN
  Administered 2020-09-13: 60 mg via INTRAVENOUS

## 2020-09-13 MED ORDER — POLYETHYLENE GLYCOL 3350 17 G PO PACK: 17.0000 g | PACK | Freq: Every day | ORAL | Status: DC | PRN

## 2020-09-13 MED ORDER — OXYCODONE-ACETAMINOPHEN 5-325 MG PO TABS
1.0000 | ORAL_TABLET | ORAL | Status: DC | PRN
  Administered 2020-09-13: 1 via ORAL
  Filled 2020-09-13: qty 1

## 2020-09-13 MED ORDER — ACETAMINOPHEN 325 MG PO TABS
325.0000 mg | ORAL_TABLET | ORAL | Status: DC | PRN
Start: 2020-09-13 — End: 2020-09-14

## 2020-09-13 MED ORDER — EPHEDRINE SULFATE-NACL 50-0.9 MG/10ML-% IV SOSY
PREFILLED_SYRINGE | INTRAVENOUS | Status: DC | PRN
  Administered 2020-09-13: 5 mg via INTRAVENOUS
  Administered 2020-09-13 (×2): 10 mg via INTRAVENOUS
  Administered 2020-09-13 (×2): 5 mg via INTRAVENOUS

## 2020-09-13 MED ORDER — FENTANYL CITRATE (PF) 250 MCG/5ML IJ SOLN
INTRAMUSCULAR | Status: AC
  Filled 2020-09-13: qty 5

## 2020-09-13 MED ORDER — ASPIRIN EC 81 MG PO TBEC
81.0000 mg | DELAYED_RELEASE_TABLET | Freq: Every day | ORAL | Status: DC
  Administered 2020-09-14: 81 mg via ORAL
  Filled 2020-09-13: qty 1

## 2020-09-13 MED ORDER — SODIUM CHLORIDE 0.9 % IV SOLN
INTRAVENOUS | Status: DC | PRN
  Administered 2020-09-13: 500 mL

## 2020-09-13 MED ORDER — LACTATED RINGERS IV SOLN: INTRAVENOUS | Status: DC

## 2020-09-13 MED ORDER — CEFAZOLIN SODIUM-DEXTROSE 2-4 GM/100ML-% IV SOLN
2.0000 g | Freq: Three times a day (TID) | INTRAVENOUS | Status: AC
  Administered 2020-09-13 (×2): 2 g via INTRAVENOUS
  Filled 2020-09-13 (×2): qty 100

## 2020-09-13 MED ORDER — ACETAMINOPHEN 10 MG/ML IV SOLN: 1000.0000 mg | Freq: Once | INTRAVENOUS | Status: DC | PRN

## 2020-09-13 MED ORDER — SODIUM CHLORIDE 0.9 % IV SOLN
INTRAVENOUS | Status: AC
  Filled 2020-09-13: qty 1.2

## 2020-09-13 MED ORDER — CEFAZOLIN SODIUM-DEXTROSE 2-4 GM/100ML-% IV SOLN
2.0000 g | INTRAVENOUS | Status: AC
  Administered 2020-09-13: 2 g via INTRAVENOUS
  Filled 2020-09-13: qty 100

## 2020-09-13 MED ORDER — PROTAMINE SULFATE 10 MG/ML IV SOLN
INTRAVENOUS | Status: DC | PRN
  Administered 2020-09-13: 50 mg via INTRAVENOUS

## 2020-09-13 MED ORDER — ALPRAZOLAM 0.5 MG PO TABS
0.5000 mg | ORAL_TABLET | Freq: Three times a day (TID) | ORAL | Status: DC | PRN
  Administered 2020-09-13 – 2020-09-14 (×2): 1 mg via ORAL
  Filled 2020-09-13 (×2): qty 2

## 2020-09-13 MED ORDER — LABETALOL HCL 5 MG/ML IV SOLN: 10.0000 mg | INTRAVENOUS | Status: DC | PRN

## 2020-09-13 MED ORDER — ATROPINE SULFATE 0.4 MG/ML IV SOSY
PREFILLED_SYRINGE | INTRAVENOUS | Status: DC | PRN
  Administered 2020-09-13: .2 mg via INTRAVENOUS

## 2020-09-13 MED ORDER — FENTANYL CITRATE (PF) 100 MCG/2ML IJ SOLN
25.0000 ug | INTRAMUSCULAR | Status: DC | PRN
  Administered 2020-09-13: 25 ug via INTRAVENOUS

## 2020-09-13 MED ORDER — ALUM & MAG HYDROXIDE-SIMETH 200-200-20 MG/5ML PO SUSP
15.0000 mL | ORAL | Status: DC | PRN
  Administered 2020-09-14: 30 mL via ORAL
  Filled 2020-09-13: qty 30

## 2020-09-13 MED ORDER — ROSUVASTATIN CALCIUM 20 MG PO TABS
20.0000 mg | ORAL_TABLET | Freq: Every day | ORAL | Status: DC
  Filled 2020-09-13 (×2): qty 1

## 2020-09-13 MED ORDER — METOPROLOL TARTRATE 50 MG PO TABS
50.0000 mg | ORAL_TABLET | Freq: Once | ORAL | Status: AC
  Administered 2020-09-13: 50 mg via ORAL

## 2020-09-13 MED ORDER — MORPHINE SULFATE (PF) 2 MG/ML IV SOLN: 2.0000 mg | INTRAVENOUS | Status: DC | PRN

## 2020-09-13 MED ORDER — BUSPIRONE HCL 5 MG PO TABS
5.0000 mg | ORAL_TABLET | Freq: Two times a day (BID) | ORAL | Status: DC
  Filled 2020-09-13: qty 1

## 2020-09-13 MED ORDER — DEXAMETHASONE SODIUM PHOSPHATE 10 MG/ML IJ SOLN
INTRAMUSCULAR | Status: AC
  Filled 2020-09-13: qty 1

## 2020-09-13 MED ORDER — IODIXANOL 320 MG/ML IV SOLN
INTRAVENOUS | Status: DC | PRN
  Administered 2020-09-13: 22 mL

## 2020-09-13 MED ORDER — ACETAMINOPHEN 650 MG RE SUPP
325.0000 mg | RECTAL | Status: DC | PRN
Start: 2020-09-13 — End: 2020-09-14

## 2020-09-13 MED ORDER — OXYCODONE HCL 5 MG PO TABS: 5.0000 mg | ORAL_TABLET | Freq: Once | ORAL | Status: DC | PRN

## 2020-09-13 MED ORDER — MAGNESIUM SULFATE 2 GM/50ML IV SOLN: 2.0000 g | Freq: Every day | INTRAVENOUS | Status: DC | PRN

## 2020-09-13 MED ORDER — ACETAMINOPHEN 160 MG/5ML PO SOLN: 1000.0000 mg | Freq: Once | ORAL | Status: DC | PRN

## 2020-09-13 MED ORDER — ORAL CARE MOUTH RINSE: 15.0000 mL | Freq: Once | OROMUCOSAL | Status: AC

## 2020-09-13 MED ORDER — GUAIFENESIN-DM 100-10 MG/5ML PO SYRP: 15.0000 mL | ORAL_SOLUTION | ORAL | Status: DC | PRN

## 2020-09-13 MED ORDER — SODIUM CHLORIDE 0.9 % IV SOLN: 500.0000 mL | Freq: Once | INTRAVENOUS | Status: DC | PRN

## 2020-09-13 MED ORDER — FENTANYL CITRATE (PF) 100 MCG/2ML IJ SOLN
INTRAMUSCULAR | Status: AC
  Filled 2020-09-13: qty 2

## 2020-09-13 MED ORDER — HYDRALAZINE HCL 20 MG/ML IJ SOLN: 5.0000 mg | INTRAMUSCULAR | Status: DC | PRN

## 2020-09-13 MED ORDER — ONDANSETRON HCL 4 MG/2ML IJ SOLN: 4.0000 mg | Freq: Four times a day (QID) | INTRAMUSCULAR | Status: DC | PRN

## 2020-09-13 MED ORDER — ROCURONIUM BROMIDE 10 MG/ML (PF) SYRINGE
PREFILLED_SYRINGE | INTRAVENOUS | Status: DC | PRN
  Administered 2020-09-13: 80 mg via INTRAVENOUS

## 2020-09-13 MED ORDER — DEXAMETHASONE SODIUM PHOSPHATE 10 MG/ML IJ SOLN
INTRAMUSCULAR | Status: DC | PRN
  Administered 2020-09-13: 10 mg via INTRAVENOUS

## 2020-09-13 MED ORDER — OXYCODONE HCL 5 MG/5ML PO SOLN: 5.0000 mg | Freq: Once | ORAL | Status: DC | PRN

## 2020-09-13 MED ORDER — LIDOCAINE 2% (20 MG/ML) 5 ML SYRINGE
INTRAMUSCULAR | Status: AC
  Filled 2020-09-13: qty 5

## 2020-09-13 MED ORDER — SODIUM CHLORIDE 0.9 % IV SOLN
0.0125 ug/kg/min | INTRAVENOUS | Status: AC
  Administered 2020-09-13: .2 ug/kg/min via INTRAVENOUS
  Filled 2020-09-13: qty 2000

## 2020-09-13 MED ORDER — GLYCOPYRROLATE PF 0.2 MG/ML IJ SOSY
PREFILLED_SYRINGE | INTRAMUSCULAR | Status: DC | PRN
  Administered 2020-09-13: .2 mg via INTRAVENOUS

## 2020-09-13 MED ORDER — ROCURONIUM BROMIDE 10 MG/ML (PF) SYRINGE
PREFILLED_SYRINGE | INTRAVENOUS | Status: AC
  Filled 2020-09-13: qty 10

## 2020-09-13 MED ORDER — DOCUSATE SODIUM 100 MG PO CAPS
100.0000 mg | ORAL_CAPSULE | Freq: Every day | ORAL | Status: DC
  Administered 2020-09-14: 100 mg via ORAL
  Filled 2020-09-13: qty 1

## 2020-09-13 MED ORDER — LORAZEPAM 2 MG/ML IJ SOLN
INTRAMUSCULAR | Status: AC
  Filled 2020-09-13: qty 1

## 2020-09-13 SURGICAL SUPPLY — 55 items
BAG BANDED W/RUBBER/TAPE 36X54 (MISCELLANEOUS) ×2 IMPLANT
BALLN STERLING RX 5X30X80 (BALLOONS) ×2
BALLOON STERLING RX 5X30X80 (BALLOONS) ×1 IMPLANT
BENZOIN TINCTURE PRP APPL 2/3 (GAUZE/BANDAGES/DRESSINGS) ×2 IMPLANT
BLADE MINI RND TIP GREEN BEAV (BLADE) IMPLANT
CANISTER SUCT 3000ML PPV (MISCELLANEOUS) ×2 IMPLANT
CATH ANGIO BERNSTEIN 5X40X.035 (CATHETERS) ×2 IMPLANT
CHLORAPREP W/TINT 26 (MISCELLANEOUS) ×2 IMPLANT
CLIP VESOCCLUDE MED 6/CT (CLIP) ×2 IMPLANT
CLIP VESOCCLUDE SM WIDE 6/CT (CLIP) ×2 IMPLANT
COVER DOME SNAP 22 D (MISCELLANEOUS) ×2 IMPLANT
COVER PROBE W GEL 5X96 (DRAPES) ×2 IMPLANT
DERMABOND ADVANCED (GAUZE/BANDAGES/DRESSINGS) ×2
DERMABOND ADVANCED .7 DNX12 (GAUZE/BANDAGES/DRESSINGS) ×2 IMPLANT
DRAPE FEMORAL ANGIO 80X135IN (DRAPES) ×2 IMPLANT
ELECT REM PT RETURN 9FT ADLT (ELECTROSURGICAL) ×2
ELECTRODE REM PT RTRN 9FT ADLT (ELECTROSURGICAL) ×1 IMPLANT
GAUZE SPONGE 4X4 12PLY STRL (GAUZE/BANDAGES/DRESSINGS) ×2 IMPLANT
GLOVE SURG SS PI 8.0 STRL IVOR (GLOVE) ×2 IMPLANT
GOWN STRL REUS W/ TWL LRG LVL3 (GOWN DISPOSABLE) ×2 IMPLANT
GOWN STRL REUS W/ TWL XL LVL3 (GOWN DISPOSABLE) ×1 IMPLANT
GOWN STRL REUS W/TWL LRG LVL3 (GOWN DISPOSABLE) ×2
GOWN STRL REUS W/TWL XL LVL3 (GOWN DISPOSABLE) ×1
GUIDEWIRE ENROUTE 0.014 (WIRE) ×2 IMPLANT
INTRODUCER KIT GALT 7CM (INTRODUCER) ×1
KIT BASIN OR (CUSTOM PROCEDURE TRAY) ×2 IMPLANT
KIT ENCORE 26 ADVANTAGE (KITS) ×2 IMPLANT
KIT INTRODUCER GALT 7 (INTRODUCER) ×1 IMPLANT
KIT NAMIC PS PRESSURIZED FLUID (KITS) ×2 IMPLANT
KIT TURNOVER KIT B (KITS) ×2 IMPLANT
NEEDLE HYPO 25GX1X1/2 BEV (NEEDLE) IMPLANT
PACK CAROTID (CUSTOM PROCEDURE TRAY) ×2 IMPLANT
PENCIL SMOKE EVACUATOR (MISCELLANEOUS) IMPLANT
POSITIONER HEAD DONUT 9IN (MISCELLANEOUS) ×2 IMPLANT
POWDER SURGICEL 3.0 GRAM (HEMOSTASIS) ×2 IMPLANT
PROTECTION STATION PRESSURIZED (MISCELLANEOUS)
SET MICROPUNCTURE 5F STIFF (MISCELLANEOUS) IMPLANT
SHEATH AVANTI 11CM 5FR (SHEATH) IMPLANT
SHUNT CAROTID BYPASS 10 (VASCULAR PRODUCTS) IMPLANT
STATION PROTECTION PRESSURIZED (MISCELLANEOUS) IMPLANT
STENT TRANSCAROTID SYSTEM 8X40 (Permanent Stent) ×2 IMPLANT
STRIP CLOSURE SKIN 1/2X4 (GAUZE/BANDAGES/DRESSINGS) ×2 IMPLANT
SUT PROLENE 5 0 C 1 24 (SUTURE) ×2 IMPLANT
SUT PROLENE 6 0 BV (SUTURE) ×2 IMPLANT
SUT SILK 2 0 SH CR/8 (SUTURE) IMPLANT
SUT VIC AB 3-0 SH 27 (SUTURE) ×1
SUT VIC AB 3-0 SH 27X BRD (SUTURE) ×1 IMPLANT
SYR 10ML LL (SYRINGE) ×6 IMPLANT
SYR 20ML LL LF (SYRINGE) ×6 IMPLANT
SYR CONTROL 10ML LL (SYRINGE) ×2 IMPLANT
SYSTEM TRANSCAROTID NEUROPRTCT (MISCELLANEOUS) ×1 IMPLANT
TOWEL GREEN STERILE (TOWEL DISPOSABLE) ×2 IMPLANT
TRANSCAROTID NEUROPROTECT SYS (MISCELLANEOUS) ×2
WATER STERILE IRR 1000ML POUR (IV SOLUTION) ×2 IMPLANT
WIRE BENTSON .035X145CM (WIRE) ×2 IMPLANT

## 2020-09-13 NOTE — Discharge Instructions (Signed)
   [facility]  Discharge Instructions   Carotid Surgery  Please refer to the following instructions for your post-procedure care. Your surgeon or physician assistant will discuss any changes with you.  Activity  You are encouraged to walk as much as you can. You can slowly return to normal activities but must avoid strenuous activity and heavy lifting until your doctor tell you it's okay. Avoid activities such as vacuuming or swinging a golf club. You can drive after one week if you are comfortable and you are no longer taking prescription pain medications. It is normal to feel tired for serval weeks after your surgery. It is also normal to have difficulty with sleep habits, eating, and bowel movements after surgery. These will go away with time.  Bathing/Showering  Shower daily after you go home. Do not soak in a bathtub, hot tub, or swim until the incision heals completely.  Incision Care  Shower every day. Clean your incision with mild soap and water. Pat the area dry with a clean towel. You do not need a bandage unless otherwise instructed. Do not apply any ointments or creams to your incision. You may have skin glue on your incision. Do not peel it off. It will come off on its own in about one week. Your incision may feel thickened and raised for several weeks after your surgery. This is normal and the skin will soften over time.   For Men Only: It's okay to shave around the incision but do not shave the incision itself for 2 weeks. It is common to have numbness under your chin that could last for several months.  Diet  Resume your normal diet. There are no special food restrictions following this procedure. A low fat/low cholesterol diet is recommended for all patients with vascular disease. In order to heal from your surgery, it is CRITICAL to get adequate nutrition. Your body requires vitamins, minerals, and protein. Vegetables are the best source of  vitamins and minerals. Vegetables also provide the perfect balance of protein. Processed food has little nutritional value, so try to avoid this.  Medications  Resume taking all of your medications unless your doctor or physician assistant tells you not to. If your incision is causing pain, you may take over-the- counter pain relievers such as acetaminophen (Tylenol). If you were prescribed a stronger pain medication, please be aware these medications can cause nausea and constipation. Prevent nausea by taking the medication with a snack or meal. Avoid constipation by drinking plenty of fluids and eating foods with a high amount of fiber, such as fruits, vegetables, and grains.   Do not take Tylenol if you are taking prescription pain medications.  Follow Up  Our office will schedule a follow up appointment 2-3 weeks following discharge.  Please call us immediately for any of the following conditions  . Increased pain, redness, drainage (pus) from your incision site. . Fever of 101 degrees or higher. . If you should develop stroke (slurred speech, difficulty swallowing, weakness on one side of your body, loss of vision) you should call 911 and go to the nearest emergency room. .  Reduce your risk of vascular disease:  . Stop smoking. If you would like help call [facility] at 1-800-QUIT-NOW (1-800-784-8669) or [facility] at 336-586-4000. . Manage your cholesterol . Maintain a desired weight . Control your diabetes . Keep your blood pressure down .  If you have any questions, please call the office at [phone]. 

## 2020-09-13 NOTE — Anesthesia Preprocedure Evaluation (Signed)
Anesthesia Evaluation  Patient identified by MRN, date of birth, ID band Patient awake    Reviewed: Allergy & Precautions, NPO status , Patient's Chart, lab work & pertinent test results, reviewed documented beta blocker date and time   History of Anesthesia Complications Negative for: history of anesthetic complications  Airway Mallampati: III  TM Distance: >3 FB Neck ROM: Full    Dental  (+) Dental Advisory Given, Teeth Intact   Pulmonary shortness of breath, Current Smoker and Patient abstained from smoking.,    breath sounds clear to auscultation       Cardiovascular hypertension, Pt. on home beta blockers and Pt. on medications + angina + CAD, + Past MI and + Cardiac Stents   Rhythm:Regular     Neuro/Psych PSYCHIATRIC DISORDERS Anxiety  Neuromuscular disease CVA    GI/Hepatic negative GI ROS, Neg liver ROS,   Endo/Other    Renal/GU Lab Results      Component                Value               Date                      CREATININE               1.12                09/10/2020                Musculoskeletal negative musculoskeletal ROS (+)   Abdominal   Peds  Hematology Lab Results      Component                Value               Date                      WBC                      8.6                 09/10/2020                HGB                      16.3                09/10/2020                HCT                      47.9                09/10/2020                MCV                      102.4 (H)           09/10/2020                PLT                      168                 09/10/2020            Brilinta  and ASA   Anesthesia Other Findings   Reproductive/Obstetrics                             Anesthesia Physical Anesthesia Plan  ASA: IV  Anesthesia Plan: General   Post-op Pain Management:    Induction: Intravenous  PONV Risk Score and Plan: 1 and Ondansetron and  Dexamethasone  Airway Management Planned: Oral ETT  Additional Equipment: Arterial line  Intra-op Plan:   Post-operative Plan: Extubation in OR  Informed Consent: I have reviewed the patients History and Physical, chart, labs and discussed the procedure including the risks, benefits and alternatives for the proposed anesthesia with the patient or authorized representative who has indicated his/her understanding and acceptance.     Dental advisory given  Plan Discussed with: CRNA and Surgeon  Anesthesia Plan Comments:         Anesthesia Quick Evaluation

## 2020-09-13 NOTE — Progress Notes (Signed)
Mobility Specialist - Progress Note   09/13/20 1805  Mobility  Activity Ambulated in hall  Level of Assistance Independent after set-up  Assistive Device None  Distance Ambulated (ft) 450 ft  Mobility Ambulated with assistance in hallway  Mobility Response Tolerated well  Mobility performed by Mobility specialist  $Mobility charge 1 Mobility   Pt asx throughout ambulation. HR remained ~80 throughout. Pt sitting up on edge of bed after walk.   Mamie Levers Mobility Specialist Mobility Specialist Phone: [phone]

## 2020-09-13 NOTE — Progress Notes (Signed)
  Day of Surgery Note    Subjective:  Wants to go home   Vitals:   09/13/20 1055 09/13/20 1110  BP: (!) 142/75 134/88  Pulse: 64 (!) 48  Resp: 14 15  Temp:    SpO2: 100% 100%    Incisions:   Clean and dry  Extremities:  Moving all extremities equally Lungs:  Non labored Neuro:  In tact; tongue is midline   Assessment/Plan:  This is a 60 y.o. male who is s/p  Left TCAR  -pt doing well in recovery -to 4 east later today  -anticipate dc tomorrow if evening is uneventful -continue brilinta and asa and statin   Doreatha Massed, PA-C 09/13/2020 11:26 AM [phone]

## 2020-09-13 NOTE — Progress Notes (Signed)
Pt arrived to rm 4 from PACU. inititated tele. CHG wipe performed. Oriented pt to the unit. Call bell within reach.   Lawson Radar, RN

## 2020-09-13 NOTE — Interval H&P Note (Signed)
History and Physical Interval Note:  09/13/2020 7:19 AM  Corey Salas  has presented today for surgery, with the diagnosis of carotid stenosis.  The various methods of treatment have been discussed with the patient and family. After consideration of risks, benefits and other options for treatment, the patient has consented to  Procedure(s): TRANSCAROTID ARTERY REVASCULARIZATION LEFT (Left) as a surgical intervention.  The patient's history has been reviewed, patient examined, no change in status, stable for surgery.  I have reviewed the patient's chart and labs.  Questions were answered to the patient's satisfaction.     Leonie Douglas

## 2020-09-13 NOTE — Anesthesia Procedure Notes (Signed)
Procedure Name: Intubation Date/Time: 09/13/2020 7:51 AM Performed by: Myna Bright, CRNA Pre-anesthesia Checklist: Patient identified, Emergency Drugs available, Suction available and Patient being monitored Patient Re-evaluated:Patient Re-evaluated prior to induction Oxygen Delivery Method: Circle system utilized Preoxygenation: Pre-oxygenation with 100% oxygen Induction Type: IV induction Ventilation: Mask ventilation without difficulty and Oral airway inserted - appropriate to patient size Laryngoscope Size: Mac and 4 Grade View: Grade III Tube type: Oral Tube size: 7.5 mm Number of attempts: 1 Airway Equipment and Method: Stylet Placement Confirmation: positive ETCO2,  ETT inserted through vocal cords under direct vision and breath sounds checked- equal and bilateral Secured at: 22 cm Tube secured with: Tape Dental Injury: Teeth and Oropharynx as per pre-operative assessment  Difficulty Due To: Difficulty was unanticipated, Difficult Airway- due to anterior larynx and Difficult Airway- due to reduced neck mobility Comments: Easy mask with oral airway in place. DL x1, view of epiglottis only. Directed ETT anteriorly, atraumatic oral intubation. Recommend Glidescope in future.

## 2020-09-13 NOTE — Transfer of Care (Signed)
Immediate Anesthesia Transfer of Care Note  Patient: Corey Salas  Procedure(s) Performed: Zada Finders ARTERY REVASCULARIZATION LEFT (Left Neck)  Patient Location: PACU  Anesthesia Type:General  Level of Consciousness: awake, alert , oriented and patient cooperative  Airway & Oxygen Therapy: Patient Spontanous Breathing  Post-op Assessment: Report given to RN, Post -op Vital signs reviewed and stable and Patient moving all extremities  Post vital signs: Reviewed and stable  Last Vitals:  Vitals Value Taken Time  BP 114/77 09/13/20 0926  Temp    Pulse 68 09/13/20 0929  Resp 18 09/13/20 0929  SpO2 91 % 09/13/20 0929  Vitals shown include unvalidated device data.  Last Pain:  Vitals:   09/13/20 0603  TempSrc: Oral  PainSc:          Complications: No complications documented.

## 2020-09-13 NOTE — Op Note (Signed)
DATE OF SERVICE: 09/13/2020  PATIENT:  Corey Salas  60 y.o. male  PRE-OPERATIVE DIAGNOSIS:  Symptomatic left carotid artery stenosis  POST-OPERATIVE DIAGNOSIS:  Same  PROCEDURE:   left transcarotid revascularization with 8x62mm EnRoute stent  SURGEON:  Surgeon(s) and Role:    * Leonie Douglas, MD - Primary    * Nada Libman, MD  ASSISTANT: Juleen China, MD  An assistant was required to facilitate exposure and expedite the case.  ANESTHESIA:   general  EBL: min  BLOOD ADMINISTERED:none  DRAINS: none   LOCAL MEDICATIONS USED:  NONE  SPECIMEN:  none  COUNTS: confirmed correct.  TOURNIQUET:  None  PATIENT DISPOSITION:  PACU - hemodynamically stable.   Delay start of Pharmacological VTE agent (>24hrs) due to surgical blood loss or risk of bleeding: no  INDICATION FOR PROCEDURE: KRUZ CHIU is a 60 y.o. male with symptomatic carotid stenosis with recent MI requiring PCI. After careful discussion of risks, benefits, and alternatives the patient was offered TCAR. We specifically discussed risk of stroke, cranial nerve injury, recurrent MI. The patient understood and wished to proceed.  OPERATIVE FINDINGS: >80% stenosis of moderately tortuous left proximal internal carotid artery. Significant bradycardia with pre-dilation. No residual stenosis after stenting in two projections. No evidence of access site complication. Less bleeding than expected - ? Compliance with brilinta therapy.   DESCRIPTION OF PROCEDURE: After identification of the patient in the pre-operative holding area, the patient was transferred to the operating room. The patient was positioned supine on the operating room table. Anesthesia was induced. The left neck and right groin were prepped and draped in standard fashion. A surgical pause was performed confirming correct patient, procedure, and operative location.  The neck was rotated to the right and an axillary roll placed between the scapula.   Using intraoperative ultrasound we marked the course of the left common carotid artery and its course behind the sternocleidomastoid just distal to the clavicle.  A transverse incision was made at the base the neck and carried through the platysma with Bovie electrocautery.  The sternocleidomastoid was then identified and split.  The carotid sheath was identified, skeletonized.  Great care was taken to preserve the vagus nerve which was anterior.  The common carotid artery was then skeletonized and encircled with a Silastic vessel loop and umbilical tape.  The patient was systemically heparinized.  Activated clotting time measurements were used to confirm adequate anticoagulation throughout the case.  A U stitch of 5-0 Prolene was placed at our planned access site.  Micropuncture technique was used to access the common carotid through the preplaced Prolene suture. Great care was can to avoid a access site complication.  With the micro sheath in, cerebral angiography was performed.  This confirmed our anatomy.  We elected to use a "stop short" technique.  Through the sheath a stiff J-wire was advanced just short of the carotid bifurcation.  Over the wire a on route carotid sheath was introduced into the carotid artery.  Ultrasound guidance was then used to access the right common femoral vein with micropuncture technique.  The flow reversal sheath was then inserted over the wire with no difficulty.  We connected the 2 sheaths with flow reversal tubing and confirmed adequate flow reversal into the common femoral vein.  We then performed a TCAR timeout correct predilation balloon, stent size, acceptable hemodynamics.  Flow reversal was initiated.  Flow reversal time in total was 9 minutes.  The lesion in the left internal  carotid artery was crossed with a KMP catheter and a 014 guidewire.  Predilation of the lesion was performed with a 5 x 30 mm balloon.  Significant bradycardia occurred with dilation of the lesion  which resolved quickly.  Atropine was administered.  We then introduced an 8 x 40mm enroute stent across the lesion into the common carotid artery and deployed it in standard fashion.  Flow reversal was paused and angiography performed into projections to confirm adequate revascularization.  No technical problems were identified.  Satisfied we ended our revascularization here.  The common carotid artery sheath was disconnected from flow reversal and blood returned via the femoral sheath.  The carotid sheath was removed and the preplaced U stitch of Prolene was secured.  Good hemostasis was achieved.  The femoral vein sheath was then removed.  Manual pressure was held.  Heparin was reversed with protamine.  Hemostasis was confirmed in the surgical wound.  Topical hemostatic agent was applied.  The wound was closed using 3-0 Vicryl to approximate the platysma.  4-0 Monocryl subcuticular stitch was used.  Dermabond was applied.  The patient awoke neurologically intact moving all 4 extremities.  Upon completion of the case instrument and sharps counts were confirmed correct. The patient was transferred to the PACU in good condition. I was present for all portions of the procedure.  Rande Brunt. Lenell Antu, MD [facility] Phone Number: [phone] 09/13/2020 9:28 AM

## 2020-09-14 ENCOUNTER — Encounter (HOSPITAL_COMMUNITY): Payer: Self-pay | Admitting: Vascular Surgery

## 2020-09-14 LAB — CBC
HCT: 42 % (ref 39.0–52.0)
Hemoglobin: 14.5 g/dL (ref 13.0–17.0)
MCH: 35 pg — ABNORMAL HIGH (ref 26.0–34.0)
MCHC: 34.5 g/dL (ref 30.0–36.0)
MCV: 101.4 fL — ABNORMAL HIGH (ref 80.0–100.0)
Platelets: 156 10*3/uL (ref 150–400)
RBC: 4.14 MIL/uL — ABNORMAL LOW (ref 4.22–5.81)
RDW: 14 % (ref 11.5–15.5)
WBC: 17.1 10*3/uL — ABNORMAL HIGH (ref 4.0–10.5)
nRBC: 0 % (ref 0.0–0.2)

## 2020-09-14 LAB — LIPID PANEL
Cholesterol: 188 mg/dL (ref 0–200)
HDL: 87 mg/dL (ref >40)
LDL Cholesterol: 83 mg/dL (ref 0–99)
Total CHOL/HDL Ratio: 2.2 RATIO
Triglycerides: 88 mg/dL (ref <150)
VLDL: 18 mg/dL (ref 0–40)

## 2020-09-14 LAB — BASIC METABOLIC PANEL
Anion gap: 7 (ref 5–15)
BUN: 14 mg/dL (ref 6–20)
CO2: 20 mmol/L — ABNORMAL LOW (ref 22–32)
Calcium: 9.1 mg/dL (ref 8.9–10.3)
Chloride: 109 mmol/L (ref 98–111)
Creatinine, Ser: 1.2 mg/dL (ref 0.61–1.24)
GFR, Estimated: >60 mL/min (ref >60)
Glucose, Bld: 157 mg/dL — ABNORMAL HIGH (ref 70–99)
Potassium: 3.7 mmol/L (ref 3.5–5.1)
Sodium: 136 mmol/L (ref 135–145)

## 2020-09-14 MED ORDER — OXYCODONE-ACETAMINOPHEN 5-325 MG PO TABS: 1.0000 | ORAL_TABLET | Freq: Four times a day (QID) | ORAL | 0 refills | Status: DC | PRN

## 2020-09-14 NOTE — Progress Notes (Signed)
  PHARMACIST LIPID MONITORING   Corey Salas is a 60 y.o. male admitted on 09/13/2020 withymptomatic left carotid artery stenosis , admitted 5/12 for surgery. S/p left transcarotid artery revascularization.  Pharmacy has been consulted to optimize lipid-lowering therapy with the indication of secondary prevention for clinical ASCVD.  Recent Labs:  Lipid Panel (last 6 months):   Lab Results  Component Value Date   CHOL 188 09/14/2020   TRIG 88 09/14/2020   HDL 87 09/14/2020   CHOLHDL 2.2 09/14/2020   VLDL 18 09/14/2020   LDLCALC 83 09/14/2020    Hepatic function panel (last 6 months):   Lab Results  Component Value Date   AST 55 (H) 09/10/2020   ALT 52 (H) 09/10/2020   ALKPHOS 61 09/10/2020   BILITOT 0.9 09/10/2020    SCr (since admission):   Serum creatinine: 1.2 mg/dL 29/56/21 3086 Estimated creatinine clearance: 69.7 mL/min  Current therapy and lipid therapy tolerance Current lipid-lowering therapy: on  Rosuvastatin 20 mg daily.  Patient's wife reports he stopped taking this about 2-2.5 weeks ago (~ end of April) due to having diarrhea.    Previous lipid-lowering therapies(if applicable):  Atorvastatin  (see assessment) Documented or reported allergies or intolerances to lipid-lowering therapies (if applicable): atorvastatin, rosuvastatin.   Assessment:   60 y.o male with symptomatic left carotid artery stenosis , admitted 5/12 for surgery  S/p  left transcarotid artery revascularization.  Pt's wife updated pta meds /allergies with me.  , reported that Atorvastatin caused swelling " he blew up" and was hospitalized in 2008 in [place].   Then ~ 07/2020 while on vacation in [place] he had a MI,  hospitalized, and Crestor was started at this time.  About 2-2.5 weeks ago , the patient stopped taking crestor  due to having  diarrhea for ~2 weeks.  Diarrhea resolved ~36 hours later. At 09/07/20 appt w/ Cardiologist Dr. Royann Shivers, they notified Dr. Royann Shivers that  pt stopped taking and  they were told that  Dr. Royann Shivers  will re-address lipid treatment after vascular procedures completed.   Refer to Dr. Erin Hearing encounter noted 09/07/20 , he  recommended starting him on PCSK9 inhibitors after his surgery.     Plan:    1.Statin intensity (high intensity recommended for all patients regardless of the LDL):   No statin recommended at this time due to intolerances as noted above. Cardiologist aware of PTA and has    recommended starting him on PCSK9 inhibitors after his surgery.  2. Consider adding ezetimibe (if any one of the following):   Cannot tolerate statin at any dose.  3.Refer to lipid clinic:   Yes  4.Follow-up with:  Cardiology provider - Christell Constant, MD  5.Follow-up labs after discharge:  No changes in lipid therapy, repeat a lipid panel in one year.      [name], RPh Clinical Pharmacist Please check AMION for all [facility] phone numbers After 10:00 PM, call Main Pharmacy [phone]  09/14/2020, 10:59 AM

## 2020-09-14 NOTE — Plan of Care (Signed)
  Problem: Education: Goal: Knowledge of General Education information will improve Description: Including pain rating scale, medication(s)/side effects and non-pharmacologic comfort measures Outcome: Adequate for Discharge   

## 2020-09-14 NOTE — Progress Notes (Addendum)
[facility] [place]  Subjective  - Doing well ready to go home.   Objective 121/86 78 97.6 F (36.4 C) (Oral) 20 93%  Intake/Output Summary (Last 24 hours) at 09/14/2020 0723 Last data filed at 09/13/2020 2326 Gross per 24 hour  Intake 2311.56 ml  Output 440 ml  Net 1871.56 ml    Left neck incision healing well without hematoma No tongue deviation and no facial droop. Moving all 4 ext.  Lungs non labored breathing  Assessment/Planning: POD # left TCAR  Stable disposition ambulating, voiding and tolerating PO's Discharge home today f/u in 2-3 weeks.  Mosetta Pigeon 09/14/2020 7:23 AM --  Laboratory Lab Results: Recent Labs    09/14/20 0200  WBC 17.1*  HGB 14.5  HCT 42.0  PLT 156   BMET Recent Labs    09/14/20 0200  NA 136  K 3.7  CL 109  CO2 20*  GLUCOSE 157*  BUN 14  CREATININE 1.20  CALCIUM 9.1    COAG Lab Results  Component Value Date   INR 0.9 09/10/2020   INR 1.0 08/21/2020   No results found for: PTT   VASCULAR STAFF ADDENDUM: I have independently interviewed and examined the patient. I agree with the above.  Home today. Reinforced importance of ASA / Brilinta / Statin therapy going forward. Follow up with me in 1 month with carotid duplex.  Rande Brunt. Lenell Antu, MD [facility] Phone Number: [phone] 09/14/2020 7:57 AM

## 2020-09-14 NOTE — Progress Notes (Signed)
Discharge instructions (including medications) discussed with and copy provided to patient/caregiver 

## 2020-09-17 NOTE — Discharge Summary (Signed)
[facility] Discharge Summary   Patient ID:  Corey Salas MRN: [id number] DOB/AGE: 1961-04-10 60 y.o.  Admit date: 09/13/2020 Discharge date: 09/14/20 Date of Surgery: 09/13/2020 Surgeon: Surgeon(s): Leonie Douglas, MD Nada Libman, MD  Admission Diagnosis: Left carotid artery stenosis [I65.22]  Discharge Diagnoses:  Left carotid artery stenosis [I65.22]  Secondary Diagnoses: Past Medical History:  Diagnosis Date  . Anginal pain (HCC)   . CAD (coronary artery disease)   . Chest pain   . Dyspnea    08/23/20- "just before my heart attack, it is normal now, unless I I am stressed/"  . Family history of adverse reaction to anesthesia 08/21/2020  . Hypertension   . Neuromuscular disorder (HCC)    neuropathy in both feet  . Neuropathy    "bottom of my feet, they say it is from alcohol, I just drink beer now, not spirits."  . PTSD (post-traumatic stress disorder)   . PTSD (post-traumatic stress disorder)   . STEMI (ST elevation myocardial infarction) (HCC) 08/06/2020  . Stroke (HCC)    08/23/20 slight weakness in right hand - can type and write- "I have to concentrate to keep pen in hand."    Procedure(s): TRANSCAROTID ARTERY REVASCULARIZATION LEFT  Discharged Condition: stable  HPI: Corey Salas is a 60 y.o. male with symptomatic carotid stenosis with recent MI requiring PCI. Plan for TCAR.   Hospital Course:  Corey Salas is a 60 y.o. male is S/P  Procedure(s): TRANSCAROTID ARTERY REVASCULARIZATION LEFT Uneventful stay over night Stable disposition no hematoma, ambulating, voiding and tolerating PO's Discharge home today f/u in 2-3 weeks.   Significant Diagnostic Studies: CBC Lab Results  Component Value Date   WBC 17.1 (H) 09/14/2020   HGB 14.5 09/14/2020   HCT 42.0 09/14/2020   MCV 101.4 (H) 09/14/2020   PLT 156 09/14/2020    BMET    Component Value Date/Time   NA 136 09/14/2020 0200   K 3.7 09/14/2020 0200   CL 109  09/14/2020 0200   CO2 20 (L) 09/14/2020 0200   GLUCOSE 157 (H) 09/14/2020 0200   BUN 14 09/14/2020 0200   CREATININE 1.20 09/14/2020 0200   CALCIUM 9.1 09/14/2020 0200   GFRNONAA >60 09/14/2020 0200   COAG Lab Results  Component Value Date   INR 0.9 09/10/2020   INR 1.0 08/21/2020     Disposition:  Discharge to :Home Discharge Instructions    Call MD for:  redness, tenderness, or signs of infection (pain, swelling, bleeding, redness, odor or green/yellow discharge around incision site)   Complete by: As directed    Call MD for:  severe or increased pain, loss or decreased feeling  in affected limb(s)   Complete by: As directed    Call MD for:  temperature >100.5   Complete by: As directed    Resume previous diet   Complete by: As directed      Allergies as of 09/14/2020      Reactions   Atorvastatin    Swelling  "blew up" all over, hospitalized (2018 in [place])    Rosuvastatin Diarrhea   Crest[place]   Pt stopped taking ~ end of April 2022,  diarrhea resolved ~36 hours later.  Cardiologist Dr. Royann Shivers is aware he is not taking and will re-address lipid treatment after vascular procedures completed.       Medication List    TAKE these medications   ALPRAZolam 1 MG tablet  Commonly known as: Xanax Take half a tablet to one tablet every 8 hours as needed for anxiety.   aspirin EC 81 MG tablet Take 1 tablet (81 mg total) by mouth daily. Swallow whole.   Brilinta 90 MG Tabs tablet Generic drug: ticagrelor Take 90 mg by mouth 2 (two) times daily.   busPIRone 5 MG tablet Commonly known as: BUSPAR Take 5 mg by mouth 2 (two) times daily.   Centrum Silver 50+Men Tabs Take 1 tablet by mouth daily.   metoprolol tartrate 50 MG tablet Commonly known as: LOPRESSOR Take 1 tablet (50 mg total) by mouth 2 (two) times daily.   nitroGLYCERIN 0.4 MG SL tablet Commonly known as: NITROSTAT Place 0.4 mg under the tongue every 5  (five) minutes as needed for chest pain.   nitroGLYCERIN 0.4 MG/SPRAY spray Commonly known as: NITROLINGUAL Place 1 spray under the tongue daily as needed for chest pain.   oxyCODONE-acetaminophen 5-325 MG tablet Commonly known as: PERCOCET/ROXICET Take 1 tablet by mouth every 6 (six) hours as needed for moderate pain.   rosuvastatin 20 MG tablet Commonly known as: CRESTOR Take 20 mg by mouth daily.   sildenafil 20 MG tablet Commonly known as: REVATIO Take 20 mg by mouth daily as needed (ed).      Verbal and written Discharge instructions given to the patient. Wound care per Discharge AVS  Follow-up Information    [facility] In 4 weeks.   Specialty: Vascular Surgery Why: Office will call you to arrange your appt (sent) Contact information: [address] [place] [address] [phone]              Signed: Mosetta Pigeon 09/17/2020, 9:42 AM --- For VQI Registry use --- Instructions: Press F2 to tab through selections.  Delete question if not applicable.   Modified Rankin score at D/C (0-6): Rankin Score=0  IV medication needed for:  1. Hypertension: No 2. Hypotension: No  Post-op Complications: No  1. Post-op CVA or TIA: No  If yes: Event classification (right eye, left eye, right cortical, left cortical, verterobasilar, other):   If yes: Timing of event (intra-op, <6 hrs post-op, >=6 hrs post-op, unknown):   2. CN injury: No  If yes: CN  injuried   3. Myocardial infarction: No  If yes: Dx by (EKG or clinical, Troponin):   4.  CHF: No  5.  Dysrhythmia (new): No  6. Wound infection: No  7. Reperfusion symptoms: No  8. Return to OR: No  If yes: return to OR for (bleeding, neurologic, other CEA incision, other):   Discharge medications: Statin use:  Yes ASA use:  Yes Beta blocker use:  Yes ACE-Inhibitor use:  No  for medical reason   P2Y12 Antagonist use: [ ]  None, [ ]  Plavix, [ ]   Plasugrel, [ ]  Ticlopinine, [x ] Ticagrelor, [ ]  Other, [ ]  No for medical reason, [ ]  Non-compliant, [ ]  Not-indicated Anti-coagulant use:  [x ] None, [ ]  Warfarin, [ ]  Rivaroxaban, [ ]  Dabigatran, [ ]  Other, [ ]  No for medical reason, [ ]  Non-compliant, [ ]  Not-indicated

## 2020-09-17 NOTE — Anesthesia Postprocedure Evaluation (Signed)
Anesthesia Post Note  Patient: Corey Salas  Procedure(s) Performed: TRANSCAROTID ARTERY REVASCULARIZATION LEFT (Left Neck)     Patient location during evaluation: PACU Anesthesia Type: General Level of consciousness: awake and alert Pain management: pain level controlled Vital Signs Assessment: post-procedure vital signs reviewed and stable Respiratory status: spontaneous breathing, nonlabored ventilation, respiratory function stable and patient connected to nasal cannula oxygen Cardiovascular status: blood pressure returned to baseline and stable Postop Assessment: no apparent nausea or vomiting Anesthetic complications: no   No complications documented.  Last Vitals:  Vitals:   09/14/20 0450 09/14/20 0745  BP: 121/86 110/82  Pulse: 78 72  Resp: 20 18  Temp: 36.4 C 36.7 C  SpO2: 93% 94%    Last Pain:  Vitals:   09/14/20 0745  TempSrc: Oral  PainSc: 0-No pain                 Arno Cullers

## 2020-09-20 ENCOUNTER — Telehealth: Payer: Self-pay | Admitting: *Deleted

## 2020-09-20 NOTE — Telephone Encounter (Signed)
Patient called stating he had TCAR done last week and he noticed the area below his incision had progressed from purplish bruise to a yellowish green color and was asking if this was normal. He states he is on a blood thinner and wanted to make sure he wasn't bleeding under his skin. Patient denies any swelling or pain in that area. He denies any difficulty swallowing or breathing. Explained to patient that fading bruise can be yellowish green in color. Instructed patient to call back if he had any swelling,pain, difficulty swallowing or difficulty breathing. Patient verbalized understanding. Patient has post op follow up 10/16/20.

## 2020-10-02 ENCOUNTER — Encounter: Payer: Self-pay | Admitting: *Deleted

## 2020-10-02 NOTE — Telephone Encounter (Addendum)
Patient and his wife have been made aware of instructions.       [facility] CARDIOVASCULAR DIVISION [facility] [address] [place] [address] Dept: [phone] Loc: [phone]  [address]  10/02/2020  You are scheduled for a Cardiac Catheterization on Thursday, June 2 with Dr. Bryan Lemma.  1. Please arrive at the [facility] (Main Entrance A) at [facility]: [address] [place], [place] [address] at 7:00 AM (This time is two hours before your procedure to ensure your preparation). Free valet parking service is available.   Special note: Every effort is made to have your procedure done on time. Please understand that emergencies sometimes delay scheduled procedures.  2. Diet: Do not eat solid foods after midnight.  The patient may have clear liquids until 5am upon the day of the procedure.  3. Labs: Already completed  4. Medication instructions in preparation for your procedure: Nothing to hold  On the morning of your procedure, take your Aspirin and Brilinta/Ticagrelor and any morning medicines NOT listed above.  You may use sips of water.  5. Plan for one night stay--bring personal belongings. 6. Bring a current list of your medications and current insurance cards. 7. You MUST have a responsible person to drive you home. 8. Someone MUST be with you the first 24 hours after you arrive home or your discharge will be delayed. 9. Please wear clothes that are easy to get on and off and wear slip-on shoes.  Thank you for allowing [place] to care for you!   --  Invasive Cardiovascular services

## 2020-10-03 ENCOUNTER — Telehealth: Payer: Self-pay | Admitting: *Deleted

## 2020-10-03 ENCOUNTER — Telehealth: Payer: Self-pay

## 2020-10-03 NOTE — Progress Notes (Signed)
Corey Salas has denied his procedure tomorrow.  Called to do a peer to peer. Phone # [phone] ref# [id number].   I was notified that there are no cardiologists available to perform a peer to peer reevaluation today.  I spent an hour on the phone speaking with various representatives of the company, including a supervisor, who told me that there is nothing else that they can do, that there is "a process" we have to go through.  I explained the urgent and possibly critical nature of this procedure and advised that I will have to recommend emergency room evaluation and hospitalization if the patient's symptoms worsen further.  Needless to say, I am very unhappy with the insurance company's approach which I find to be obstructive and highly inefficient.  Scheduled to have a peer to peer discussion with cardiologist, Dr. Carlos Levering tomorrow at 0815 hrs.

## 2020-10-03 NOTE — Telephone Encounter (Signed)
Spoke with the patient and his wife. [employer] denied the original request for the cardiac cath. A peer to peer is scheduled for tomorrow.   The cath has been rescheduled for Friday 6/3 with Dr. Excell Seltzer pending approval.

## 2020-10-03 NOTE — Telephone Encounter (Addendum)
Left a message for the pt to call back:    Pt contacted pre-catheterization scheduled at [facility] for: 10/05/20 Verified arrival time and place: [facility] Main Entrance A [facility]) at: 7:00 am.    No solid food after midnight prior to cath, clear liquids until 5 AM day of procedure.  CONTRAST ALLERGY:  AM meds can be  taken pre-cath with sips of water including: ASA 81 mg Brilinta 90 mg    Confirmed patient has responsible adult to drive home post procedure and be with patient first 24 hours after arriving home:  You are allowed ONE visitor in the waiting room during the time you are at the hospital for your procedure. Both you and your visitor must wear a mask once you enter the hospital.   Patient reports does not currently have any symptoms concerning for COVID-19 and no household members with COVID-19 like illness.

## 2020-10-04 NOTE — Telephone Encounter (Signed)
Left another message for the pt to call back.  

## 2020-10-05 ENCOUNTER — Other Ambulatory Visit: Payer: Self-pay

## 2020-10-05 ENCOUNTER — Encounter (HOSPITAL_COMMUNITY): Payer: Self-pay | Admitting: Cardiovascular Disease

## 2020-10-05 ENCOUNTER — Encounter (HOSPITAL_COMMUNITY)
Admission: RE | Disposition: A | Discharge status: Home / Self Care | Payer: Self-pay | Source: Home / Self Care | Attending: Cardiovascular Disease

## 2020-10-05 ENCOUNTER — Ambulatory Visit (HOSPITAL_COMMUNITY)
Admission: RE | Admit: 2020-10-05 | Discharge: 2020-10-05 | Disposition: A | Discharge status: Home / Self Care | Payer: Managed Care, Other (non HMO) | Attending: Cardiovascular Disease | Admitting: Cardiovascular Disease

## 2020-10-05 DIAGNOSIS — Z955 Presence of coronary angioplasty implant and graft: Secondary | ICD-10-CM | POA: Diagnosis not present

## 2020-10-05 DIAGNOSIS — I25119 Atherosclerotic heart disease of native coronary artery with unspecified angina pectoris: Secondary | ICD-10-CM | POA: Insufficient documentation

## 2020-10-05 DIAGNOSIS — I2510 Atherosclerotic heart disease of native coronary artery without angina pectoris: Secondary | ICD-10-CM | POA: Diagnosis present

## 2020-10-05 HISTORY — PX: LEFT HEART CATH AND CORONARY ANGIOGRAPHY: CATH118249

## 2020-10-05 LAB — CBC
HCT: 46.7 % (ref 39.0–52.0)
Hemoglobin: 15.9 g/dL (ref 13.0–17.0)
MCH: 35.7 pg — ABNORMAL HIGH (ref 26.0–34.0)
MCHC: 34 g/dL (ref 30.0–36.0)
MCV: 104.7 fL — ABNORMAL HIGH (ref 80.0–100.0)
Platelets: 182 10*3/uL (ref 150–400)
RBC: 4.46 MIL/uL (ref 4.22–5.81)
RDW: 14.9 % (ref 11.5–15.5)
WBC: 7.7 10*3/uL (ref 4.0–10.5)
nRBC: 0 % (ref 0.0–0.2)

## 2020-10-05 SURGERY — LEFT HEART CATH AND CORONARY ANGIOGRAPHY
Anesthesia: LOCAL

## 2020-10-05 MED ORDER — SODIUM CHLORIDE 0.9% FLUSH: 3.0000 mL | Freq: Two times a day (BID) | INTRAVENOUS | Status: DC

## 2020-10-05 MED ORDER — HEPARIN (PORCINE) IN NACL 1000-0.9 UT/500ML-% IV SOLN
INTRAVENOUS | Status: AC
  Filled 2020-10-05: qty 1000

## 2020-10-05 MED ORDER — SODIUM CHLORIDE 0.9 % WEIGHT BASED INFUSION: 1.0000 mL/kg/h | INTRAVENOUS | Status: DC

## 2020-10-05 MED ORDER — ACETAMINOPHEN 325 MG PO TABS: 650.0000 mg | ORAL_TABLET | ORAL | Status: DC | PRN

## 2020-10-05 MED ORDER — SODIUM CHLORIDE 0.9 % IV SOLN: 250.0000 mL | INTRAVENOUS | Status: DC | PRN

## 2020-10-05 MED ORDER — FENTANYL CITRATE (PF) 100 MCG/2ML IJ SOLN
INTRAMUSCULAR | Status: DC | PRN
  Administered 2020-10-05 (×2): 25 ug via INTRAVENOUS

## 2020-10-05 MED ORDER — FENTANYL CITRATE (PF) 100 MCG/2ML IJ SOLN
INTRAMUSCULAR | Status: AC
  Filled 2020-10-05: qty 2

## 2020-10-05 MED ORDER — HEPARIN SODIUM (PORCINE) 1000 UNIT/ML IJ SOLN
INTRAMUSCULAR | Status: DC | PRN
  Administered 2020-10-05: 4000 [IU] via INTRAVENOUS

## 2020-10-05 MED ORDER — HEPARIN SODIUM (PORCINE) 1000 UNIT/ML IJ SOLN
INTRAMUSCULAR | Status: AC
  Filled 2020-10-05: qty 1

## 2020-10-05 MED ORDER — SODIUM CHLORIDE 0.9% FLUSH: 3.0000 mL | INTRAVENOUS | Status: DC | PRN

## 2020-10-05 MED ORDER — IOHEXOL 350 MG/ML SOLN
INTRAVENOUS | Status: DC | PRN
  Administered 2020-10-05: 25 mL

## 2020-10-05 MED ORDER — MIDAZOLAM HCL 2 MG/2ML IJ SOLN
INTRAMUSCULAR | Status: AC
  Filled 2020-10-05: qty 2

## 2020-10-05 MED ORDER — SODIUM CHLORIDE 0.9 % WEIGHT BASED INFUSION
3.0000 mL/kg/h | INTRAVENOUS | Status: AC
  Administered 2020-10-05: 3 mL/kg/h via INTRAVENOUS

## 2020-10-05 MED ORDER — ASPIRIN 81 MG PO CHEW: 81.0000 mg | CHEWABLE_TABLET | ORAL | Status: DC

## 2020-10-05 MED ORDER — LIDOCAINE HCL (PF) 1 % IJ SOLN
INTRAMUSCULAR | Status: DC | PRN
  Administered 2020-10-05: 2 mL

## 2020-10-05 MED ORDER — VERAPAMIL HCL 2.5 MG/ML IV SOLN
INTRAVENOUS | Status: AC
  Filled 2020-10-05: qty 2

## 2020-10-05 MED ORDER — LABETALOL HCL 5 MG/ML IV SOLN: 10.0000 mg | INTRAVENOUS | Status: DC | PRN

## 2020-10-05 MED ORDER — HEPARIN (PORCINE) IN NACL 1000-0.9 UT/500ML-% IV SOLN
INTRAVENOUS | Status: DC | PRN
  Administered 2020-10-05 (×2): 500 mL

## 2020-10-05 MED ORDER — LIDOCAINE HCL (PF) 1 % IJ SOLN
INTRAMUSCULAR | Status: AC
  Filled 2020-10-05: qty 30

## 2020-10-05 MED ORDER — LORAZEPAM 2 MG/ML IJ SOLN: 1.0000 mg | Freq: Once | INTRAMUSCULAR | Status: DC

## 2020-10-05 MED ORDER — LABETALOL HCL 5 MG/ML IV SOLN
10.0000 mg | INTRAVENOUS | Status: DC | PRN
Start: 2020-10-05 — End: 2020-10-05

## 2020-10-05 MED ORDER — VERAPAMIL HCL 2.5 MG/ML IV SOLN
INTRAVENOUS | Status: DC | PRN
  Administered 2020-10-05: 10 mL via INTRA_ARTERIAL

## 2020-10-05 MED ORDER — HYDRALAZINE HCL 20 MG/ML IJ SOLN: 10.0000 mg | INTRAMUSCULAR | Status: DC | PRN

## 2020-10-05 MED ORDER — ONDANSETRON HCL 4 MG/2ML IJ SOLN: 4.0000 mg | Freq: Four times a day (QID) | INTRAMUSCULAR | Status: DC | PRN

## 2020-10-05 MED ORDER — MIDAZOLAM HCL 2 MG/2ML IJ SOLN
INTRAMUSCULAR | Status: DC | PRN
  Administered 2020-10-05: 1 mg via INTRAVENOUS
  Administered 2020-10-05: 2 mg via INTRAVENOUS

## 2020-10-05 SURGICAL SUPPLY — 10 items
CATH 5FR JL3.5 JR4 ANG PIG MP (CATHETERS) ×2 IMPLANT
DEVICE RAD COMP TR BAND LRG (VASCULAR PRODUCTS) ×2 IMPLANT
GLIDESHEATH SLEND SS 6F .021 (SHEATH) ×2 IMPLANT
GUIDEWIRE INQWIRE 1.5J.035X260 (WIRE) ×1 IMPLANT
INQWIRE 1.5J .035X260CM (WIRE) ×2
KIT HEART LEFT (KITS) ×2 IMPLANT
PACK CARDIAC CATHETERIZATION (CUSTOM PROCEDURE TRAY) ×2 IMPLANT
SHEATH PROBE COVER 6X72 (BAG) ×2 IMPLANT
TRANSDUCER W/STOPCOCK (MISCELLANEOUS) ×2 IMPLANT
TUBING CIL FLEX 10 FLL-RA (TUBING) ×2 IMPLANT

## 2020-10-05 NOTE — Interval H&P Note (Signed)
History and Physical Interval Note:  10/05/2020 9:17 AM  Corey Salas  has presented today for surgery, with the diagnosis of angina.  The various methods of treatment have been discussed with the patient and family. After consideration of risks, benefits and other options for treatment, the patient has consented to  Procedure(s): LEFT HEART CATH AND CORONARY ANGIOGRAPHY (N/A) as a surgical intervention.  The patient's history has been reviewed, patient examined, no change in status, stable for surgery.  I have reviewed the patient's chart and labs.  Questions were answered to the patient's satisfaction.     [name]

## 2020-10-05 NOTE — H&P (View-Only) (Signed)
Corey Salas has had increasingly frequent episodes of chest discomfort with light activity or associated with emotional states. 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 We are therefore compelled top move up the plans for repeat angiography and probable surgical revascularization for multivessel CAD with left coronary stenosis, notwithstanding recent acute MI (April 8), ischemic stroke (April 19) and L TCAR (May 12). This procedure has been fully reviewed with the patient and written informed consent has been obtained.

## 2020-10-05 NOTE — Discharge Instructions (Signed)
Radial Site Care  This sheet gives you information about how to care for yourself after your procedure. Your health care provider may also give you more specific instructions. If you have problems or questions, contact your health care provider. What can I expect after the procedure? After the procedure, it is common to have:  Bruising and tenderness at the catheter insertion area. Follow these instructions at home: Medicines  Take over-the-counter and prescription medicines only as told by your health care provider. Insertion site care  Follow instructions from your health care provider about how to take care of your insertion site. Make sure you: ? Wash your hands with soap and water before you change your bandage (dressing). If soap and water are not available, use hand sanitizer. ? Change your dressing as told by your health care provider. ? Leave stitches (sutures), skin glue, or adhesive strips in place. These skin closures may need to stay in place for 2 weeks or longer. If adhesive strip edges start to loosen and curl up, you may trim the loose edges. Do not remove adhesive strips completely unless your health care provider tells you to do that.  Check your insertion site every day for signs of infection. Check for: ? Redness, swelling, or pain. ? Fluid or blood. ? Pus or a bad smell. ? Warmth.  Do not take baths, swim, or use a hot tub until your health care provider approves.  You may shower 24-48 hours after the procedure, or as directed by your health care provider. ? Remove the dressing and gently wash the site with plain soap and water. ? Pat the area dry with a clean towel. ? Do not rub the site. That could cause bleeding.  Do not apply powder or lotion to the site. Activity  For 24 hours after the procedure, or as directed by your health care provider: ? Do not flex or bend the affected arm. ? Do not push or pull heavy objects with the affected arm. ? Do not drive  yourself home from the hospital or clinic. You may drive 24 hours after the procedure unless your health care provider tells you not to. ? Do not operate machinery or power tools.  Do not lift anything that is heavier than 10 lb (4.5 kg), or the limit that you are told, until your health care provider says that it is safe.  Ask your health care provider when it is okay to: ? Return to work or school. ? Resume usual physical activities or sports. ? Resume sexual activity.   General instructions  If the catheter site starts to bleed, raise your arm and put firm pressure on the site. If the bleeding does not stop, get help right away. This is a medical emergency.  If you went home on the same day as your procedure, a responsible adult should be with you for the first 24 hours after you arrive home.  Keep all follow-up visits as told by your health care provider. This is important. Contact a health care provider if:  You have a fever.  You have redness, swelling, or yellow drainage around your insertion site. Get help right away if:  You have unusual pain at the radial site.  The catheter insertion area swells very fast.  The insertion area is bleeding, and the bleeding does not stop when you hold steady pressure on the area.  Your arm or hand becomes pale, cool, tingly, or numb. These symptoms may represent a serious   problem that is an emergency. Do not wait to see if the symptoms will go away. Get medical help right away. Call your local emergency services (911 in the [place] Do not drive yourself to the hospital. Summary  After the procedure, it is common to have bruising and tenderness at the site.  Follow instructions from your health care provider about how to take care of your radial site wound. Check the wound every day for signs of infection.  Do not lift anything that is heavier than 10 lb (4.5 kg), or the limit that you are told, until your health care provider says that it  is safe. This information is not intended to replace advice given to you by your health care provider. Make sure you discuss any questions you have with your health care provider. Document Revised: 05/27/2017 Document Reviewed: 05/27/2017 Elsevier Patient Education  2021 Elsevier Inc.  

## 2020-10-05 NOTE — Progress Notes (Signed)
Corey Salas has had increasingly frequent episodes of chest discomfort with light activity or associated with emotional states. 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 We are therefore compelled top move up the plans for repeat angiography and probable surgical revascularization for multivessel CAD with left coronary stenosis, notwithstanding recent acute MI (April 8), ischemic stroke (April 19) and L TCAR (May 12). This procedure has been fully reviewed with the patient and written informed consent has been obtained.  

## 2020-10-05 NOTE — Telephone Encounter (Signed)
Please refill same dose and schedule, #30 tabs, no RF

## 2020-10-08 ENCOUNTER — Encounter: Payer: Managed Care, Other (non HMO) | Admitting: Cardiothoracic Surgery

## 2020-10-10 ENCOUNTER — Telehealth: Payer: Self-pay | Admitting: Cardiovascular Disease

## 2020-10-10 ENCOUNTER — Other Ambulatory Visit: Payer: Self-pay

## 2020-10-10 ENCOUNTER — Institutional Professional Consult (permissible substitution) (INDEPENDENT_AMBULATORY_CARE_PROVIDER_SITE_OTHER): Payer: Managed Care, Other (non HMO) | Admitting: Cardiothoracic Surgery

## 2020-10-10 VITALS — BP 112/73 | HR 75 | Resp 20 | Ht 71.0 in | Wt 175.0 lb

## 2020-10-10 DIAGNOSIS — I2510 Atherosclerotic heart disease of native coronary artery without angina pectoris: Secondary | ICD-10-CM | POA: Diagnosis not present

## 2020-10-10 NOTE — Telephone Encounter (Signed)
    Darreld Mclean RN case manager with [employer], she said if there's anything Dr. Salena Saner needs to call her, she said to keep her number for the pt. [phone] ext. [phone]

## 2020-10-10 NOTE — Progress Notes (Signed)
[address]       [name] [address]             [phone]     CARDIOTHORACIC SURGERY CONSULTATION REPORT  Referring Provider is [name], MD Primary Cardiologist is Christell Constant, MD PCP is Pcp, No  Chief Complaint  Patient presents with  . Coronary Artery Disease    Surgical consult, Cardiac Cath 10/05/20    HPI:  Pleasant 60 year old man presents for surgical consultation of multivessel coronary artery disease.  States he began feeling fatigue this past December.  He was on vacation in [place] in April when he had a STEMI presented urgently to the local hospital.  He underwent PCI of the RCA at that point.  He was advised to follow-up which he did at [facility] this past month.  He underwent repeat cath demonstrating severe multivessel disease including left main disease.  His symptoms have been stable with fatigue and dyspnea on exertion.  He denies any orthopnea or PND.  Patient's recent history is also notable for a stroke with mild right body residual symptoms of weakness.  He is status post left sided TCAR.  Past Medical History:  Diagnosis Date  . Anginal pain (HCC)   . CAD (coronary artery disease)   . Chest pain   . Dyspnea    08/23/20- "just before my heart attack, it is normal now, unless I I am stressed/"  . Family history of adverse reaction to anesthesia 08/21/2020  . Hypertension   . Neuromuscular disorder (HCC)    neuropathy in both feet  . Neuropathy    "bottom of my feet, they say it is from alcohol, I just drink beer now, not spirits."  . PTSD (post-traumatic stress disorder)   . PTSD (post-traumatic stress disorder)   . STEMI (ST elevation myocardial infarction) (HCC) 08/06/2020  . Stroke (HCC)    08/23/20 slight weakness in right hand - can type and write- "I have to concentrate to keep pen in hand."    Past Surgical History:  Procedure Laterality Date  . APPENDECTOMY    . CORONARY ANGIOPLASTY WITH STENT  PLACEMENT     6X  . FRACTURE SURGERY     right tibia repair  . LEFT HEART CATH AND CORONARY ANGIOGRAPHY N/A 10/05/2020   Procedure: LEFT HEART CATH AND CORONARY ANGIOGRAPHY;  Surgeon: [name], MD;  Location: [facility] INVASIVE CV LAB;  Service: Cardiovascular;  Laterality: N/A;  . TRANSCAROTID ARTERY REVASCULARIZATION Left 09/13/2020   Procedure: TRANSCAROTID ARTERY REVASCULARIZATION LEFT;  Surgeon: Leonie Douglas, MD;  Location: [facility] OR;  Service: Vascular;  Laterality: Left;    Family History  Problem Relation Age of Onset  . Heart attack Mother   . Heart attack Father     Social History   Socioeconomic History  . Marital status: Married    Spouse name: Not on file  . Number of children: Not on file  . Years of education: Not on file  . Highest education level: Not on file  Occupational History  . Not on file  Tobacco Use  . Smoking status: Current Every Day Smoker    Packs/day: 1.00  . Smokeless tobacco: Never Used  . Tobacco comment: Patient rolls his cigarettes- equaliavant to 1 pakg a day.  Vaping Use  . Vaping Use: Never used  Substance and Sexual Activity  . Alcohol use: Yes    Alcohol/week: 48.0 standard drinks    Types:  48 Cans of beer per week  . Drug use: Never  . Sexual activity: Not on file  Other Topics Concern  . Not on file  Social History Narrative  . Not on file   Social Determinants of Health   Financial Resource Strain: Not on file  Food Insecurity: No Food Insecurity  . Worried About Programme researcher, broadcasting/film/video in the Last Year: Never true  . Ran Out of Food in the Last Year: Never true  Transportation Needs: No Transportation Needs  . Lack of Transportation (Medical): No  . Lack of Transportation (Non-Medical): No  Physical Activity: Not on file  Stress: No Stress Concern Present  . Feeling of Stress : Only a little  Social Connections: Not on file  Intimate Partner Violence: Not on file    Current Outpatient Medications  Medication Sig  Dispense Refill  . ALPRAZolam (XANAX) 1 MG tablet Take half a tablet to one tablet every 8 hours as needed for anxiety. (Patient taking differently: Take 1 mg by mouth every 8 (eight) hours as needed for anxiety.) 30 tablet 0  . aspirin EC 81 MG tablet Take 1 tablet (81 mg total) by mouth daily. Swallow whole. 90 tablet 3  . BRILINTA 90 MG TABS tablet Take 90 mg by mouth 2 (two) times daily.    Marland Kitchen esomeprazole (NEXIUM) 20 MG capsule Take 20 mg by mouth daily at 12 noon.    . metoprolol tartrate (LOPRESSOR) 50 MG tablet Take 1 tablet (50 mg total) by mouth 2 (two) times daily. 180 tablet 3  . Multiple Vitamins-Minerals (CENTRUM SILVER 50+MEN) TABS Take 1 tablet by mouth daily.    . nitroGLYCERIN (NITROLINGUAL) 0.4 MG/SPRAY spray Place 1 spray under the tongue daily as needed for chest pain.    . nitroGLYCERIN (NITROSTAT) 0.4 MG SL tablet Place 0.4 mg under the tongue every 5 (five) minutes as needed for chest pain.    Marland Kitchen oxyCODONE-acetaminophen (PERCOCET/ROXICET) 5-325 MG tablet Take 1 tablet by mouth every 6 (six) hours as needed for moderate pain. 20 tablet 0   No current facility-administered medications for this visit.    Allergies  Allergen Reactions  . Atorvastatin     Swelling  "blew up" all over, hospitalized (2018 in [place])    . Rosuvastatin Diarrhea    [place]rest[place]   Pt stopped taking ~ end of April 2022,  diarrhea resolved ~36 hours later.  Cardiologist Dr. Royann Shivers is aware he is not taking and will re-address lipid treatment after vascular procedures completed.       Review of Systems:   General:  Reduced energy,  Cardiac:  Per HPI  Respiratory:  No cough or sleep apnea  GI:   History of reflux disease  GU:   No prostate or kidney disease  Vascular:  History of stroke as noted; history of pain in his feet attributed to neuropathy  Neuro:   Peripheral neuropathy  Musculoskeletal: Negative for arthritis or  myositis  Skin:   Negative  Psych:   Treated for PTSD and depression  Eyes:   Wears glasses  ENT:   Negative  Hematologic:   easy bruising on antiplatelet therapy  Endocrine:  No diabetes,     Physical Exam:   BP 112/73   Pulse 75   Resp 20   Ht 5\' 11"  (1.803 m)   Wt 79.4 kg   SpO2 98% Comment: RA  BMI 24.41 kg/m   General:  well-appearing  HEENT:  Unremarkable  Neck:   no JVD, no bruits, no adenopathy   Chest:   clear to auscultation, symmetrical breath sounds, no wheezes, no rhonchi   CV:   RRR, no  murmur   Abdomen:  soft, non-tender, no masses   Extremities:  warm, well-perfused, pulses intact and symmetrical, no LE edema  Rectal/GU  Deferred  Neuro:   Grossly non-focal and symmetrical throughout  Skin:   Clean and dry, no rashes, no breakdown   Diagnostic Tests:  I have personally reviewed his left heart catheterization from 10/05/2020 and agree with interpretation   Impression:  60 year old man with multivessel coronary artery disease including 80% left main.  His situation is complicated by recent PCI with drug-eluting stent to the RCA.  He also has undergone recent TCAR.  Nevertheless, 80% left main is associated with high mortality treated medically.  I would suggest CABG operation as soon as possible   Plan:  Patient will call us tomorrow for a decision as to his disposition   I spent in excess of 30 minutes during the conduct of this office consultation and >50% of this time involved direct face-to-face encounter with the patient for counseling and/or coordination of their care.          Level 3 Office Consult = 40 minutes         Level 4 Office Consult = 60 minutes         Level 5 Office Consult = 80 minutes  B.  Lorayne Marek, MD 10/10/2020 4:08 PM

## 2020-10-12 NOTE — H&P (Signed)
Office Visit  09/07/2020 [facility]  Croitoru, Mihai, MD  Cardiology Coronary artery disease involving native coronary artery of native heart with angina pectoris with documented spasm (HCC) +1 more  Dx Coronary Artery Disease  Reason for Visit    Additional Documentation  Vitals:  BP 152/98 Important   (BP Location: Left Arm, Patient Position: Sitting, Cuff Size: Normal) Pulse 94 Ht  (1.803 m) Wt 80.2 kg BMI 24.66 kg/m BSA 2 m  More Vitals  Flowsheets:  Anthropometrics, NEWS, MEWS Score   Encounter Info:  Billing Info, History, Allergies, Detailed Report    Orthostatic Vitals Recorded in This Encounter   09/07/2020  1056     Patient Position: Sitting  BP Location: Left Arm  Cuff Size: Normal   All Notes    Progress Notes by Thurmon Fair, MD at 09/07/2020 11:00 AM  Author: Thurmon Fair, MD Author Type: Physician Filed: 09/12/2020 11:38 AM  Note Status: Signed Cosign: Cosign Not Required Encounter Date: 09/07/2020  Editor: Thurmon Fair, MD (Physician)             Expand AllCollapse All    Cardiology Office Note:    Date:  09/12/2020    ID:  Corey Salas, DOB [birth year], MRN [id number]   PCP:  [facility]  Cardiologist:  Christell Constant, MD  Advanced Practice Provider:  No care team member to display Electrophysiologist:  None        Referring MD: No ref. provider found    No chief complaint on file.     History of Present Illness:    He was previously seen by Dr. Servando Salina, but was in the process of transitioning to Dr. Izora Ribas, when an urgent consult was requested by the Anesthesiology service, for "clearance" prior to planned carotid procedure in 1 week.   Corey Salas is a 60 y.o. 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   Since his last appointment he has not had any problems with either new focal neurological complaints or cardiovascular symptoms.  He denies angina.  He developed a lot of diarrhea and stopped taking medications including the carvedilol and rosuvastatin.  The diarrhea stopped promptly after that.  He has not been on any beta-blocker for about a week.  He reports a history of "hives" when taking atorvastatin, but did not have any skin reaction with rosuvastatin.  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   He is scheduled to have his carotid procedure (TCAR w Dr. Lenell Antu) on Thursday, May 12.   I called the Cath Lab in [facility] again requesting the images from his cardiac catheterization report.  I was told that their system is still down and they are unable to bring CDs.  Its been a month now.  [Addendum: TRIED again on 09-11-2020 and had the same response]   His initial presentation for coronary disease was in 2006 at age 77, when he had a myocardial infarction with  placement of a drug-eluting stent to the right coronary artery (Taxus 3.5x16).  In 2009 he had stents placed to the RCA (Taxus 2.75x12) and LAD (Taxus 3.0x18).  In 2015 he had 2 stents placed while he was living in [place] (Records not available for that procedure).  Most of his cardiology care has been in [facility].  During one of his catheterizations he was noted to have vasospasm and has been diagnosed with Prinzmetal's angina (Dr. Collie Siad).  Most recently, also in [facility], he had an acute inferior myocardial  infarction on August 10, 2020 and received a drug-eluting stent to the right coronary artery it was also noted that he had severe disease in the left main coronary artery and multivessel stenoses.  The decision was made to proceed with stenting because he "kept having cardiac arrest on the Cath Lab table".  The plan was for him to undergo subsequent bypass surgery after 5 weeks of dual antiplatelet therapy.  However he now lives,  and his wife works, in [place] so they returned home, with a plan to undergo surgical evaluation here.  He has not had any angina pectoris since the last myocardial infarction.  He was told that his heart muscle was well preserved.  Our anesthesiology team had access to an echo report that showed "left ventricular systolic function was low normal with EF 50-55% and inferior wall hypokinesis.  There were no significant valvular abnormalities".  Unfortunately, I cannot locate a copy of that report.  We also do not have a report of the cardiac cath procedure, let alone the images.   In the meantime he had an acute ischemic stroke on 08/21/2020, sudden onset of right-sided weakness, right facial droop, dysarthria and ataxia.  MRI showed a small acute ischemic stroke in the left centrum semiovale.  CT angiography showed a 60% left carotid stenosis.  His neurological symptoms improved rapidly and at this point he only has some mild clumsiness in his right hand.  Cardiology did not see him during that hospitalization.  He was continued on aspirin and Brilinta and statin, scheduled for TCAR (relatively high location of the stenosis; recent MI with need for uninterrupted antiplatelet therapy) on Friday, April 29 with Dr. Lenell Antu.   He was evaluated by the anesthesiology team on 08/23/2020 and they requested cardiology evaluation before proceeding with the planned surgical procedure.     Past medical problems also include a history of GI bleeding from gastritis, more than 10 years ago.   This resolved with temporary discontinuation of aspirin.  He has hypertension that has generally been well treated.  He has a history of erectile dysfunction that responded to sildenafil.  He has a history of benign gynecomastia and stable pulmonary nodules.  He had delayed urticaria and angioedema after his first cardiac catheterization 2006, but this has not recurred on subsequent procedures.   Mr. Acton has a long history of PTSD.  I did not explore the origins of this at today's appointment).  He has also had a lot of reluctance to take medications in general to embark upon preventive health measures.  He only agreed to restart taking statins after his most recent acute MI in early April.  He continues to smoke about a pack of cigarettes a day.  He is never really been able to quit smoking in over 40 years.  In the past he also used to consume alcohol heavily, often consumes a daily sixpack of beer right now.  He  has a lot of issues with anxiety.     He did very poorly in the hospital for his recent stroke when he was denied a nicotine patch.   He is originally from the [place], close to [place].       Past Medical History:  Diagnosis Date   Anginal pain (HCC)     CAD (coronary artery disease)     Chest pain     Dyspnea      08/23/20- "just before my heart attack, it is normal now, unless I I am stressed/"   Family history of adverse reaction to anesthesia 08/21/2020   Hypertension     Neuromuscular disorder (HCC)      neuropathy in both feet   Neuropathy      "bottom of my feet, they say it is from alcohol, I just drink beer now, not spirits."   PTSD (post-traumatic stress disorder)     PTSD (post-traumatic stress disorder)     STEMI (ST elevation myocardial infarction) (HCC) 08/06/2020   Stroke (HCC)      08/23/20 slight weakness in right hand - can type and write- "I have to concentrate to keep pen in hand."           Past Surgical History:  Procedure Laterality  Date   APPENDECTOMY       CORONARY ANGIOPLASTY WITH STENT PLACEMENT        6X   FRACTURE SURGERY        right tibia repair      Current Medications: Active Medications      Current Meds  Medication Sig   ALPRAZolam (XANAX) 1 MG tablet Take half a tablet to one tablet every 8 hours as needed for anxiety.   aspirin EC 81 MG tablet Take 1 tablet (81 mg total) by mouth daily. Swallow whole.   BRILINTA 90 MG TABS tablet Take 90 mg by mouth 2 (two) times daily.   busPIRone (BUSPAR) 5 MG tablet Take 5 mg by mouth 2 (two) times daily.   metoprolol tartrate (LOPRESSOR) 50 MG tablet Take 1 tablet (50 mg total) by mouth 2 (two) times daily.   Multiple Vitamins-Minerals (CENTRUM SILVER 50+MEN) TABS Take 1 tablet by mouth daily.   nitroGLYCERIN (NITROLINGUAL) 0.4 MG/SPRAY spray Place 1 spray under the tongue daily as needed for chest pain.   nitroGLYCERIN (NITROSTAT) 0.4 MG SL tablet Place 0.4 mg under the tongue every 5 (five) minutes as needed for chest pain.   rosuvastatin (CRESTOR) 20 MG tablet Take 20 mg by mouth daily. (Patient not taking: Reported on 09/07/2020)   sildenafil (REVATIO) 20 MG tablet Take 20 mg by mouth daily as needed (ed).   [DISCONTINUED] carvedilol (COREG) 12.5 MG tablet Take 12.5 mg by mouth 2 (two) times daily.        Allergies:   Patient has no known allergies.    Social History         Socioeconomic History   Marital status: Married      Spouse name: Not on file   Number of children: Not on file   Years of education: Not on file   Highest education level: Not on file  Occupational History   Not on file  Tobacco Use   Smoking status: Current Every Day Smoker      Packs/day: 1.00   Smokeless tobacco: Never Used   Tobacco comment: Patient rolls his cigarettes- equaliavant to 1 pakg a day.  Vaping Use   Vaping Use:  Never used  Substance and Sexual Activity   Alcohol use: Yes      Alcohol/week: 48.0 standard drinks      Types: 48 Cans of beer per week    Drug use: Never   Sexual activity: Not on file  Other Topics Concern   Not on file  Social History Narrative   Not on file    Social Determinants of Health       Financial Resource Strain: Not on file  Food Insecurity: No Food Insecurity   Worried About Running Out of Food in the Last Year: Never true   Ran Out of Food in the Last Year: Never true  Transportation Needs: No Transportation Needs   Lack of Transportation (Medical): No   Lack of Transportation (Non-Medical): No  Physical Activity: Not on file  Stress: No Stress Concern Present   Feeling of Stress : Only a little  Social Connections: Not on file      Family History: The patient's family history includes Heart attack in his father and mother.  His father died of heart disease at age 17.   ROS:   Please see the history of present illness.     All other systems reviewed and are negative.   EKGs/Labs/Other Studies Reviewed:     The following studies were reviewed today:   Carotid duplex ultrasound 08/22/2020 Right Carotid: Velocities in the right ICA are consistent with a 1-39% stenosis.  Left Carotid: Velocities in the left ICA are consistent with a 60-79% stenosis.  Vertebrals: Bilateral vertebral arteries demonstrate antegrade flow.   TTE 08/06/2020 [facility] [place]): Summary: Normal mitral valve structure and function. Trace mitral valve regurgitation. Normal aortic valve structure and function. No aortic regurgitation. No aortic stenosis. Normal tricuspid valve structure and function. Trace tricuspid valve regurgitation. Normal right ventricular systolic pressure. Normal pulmonic valve structure and function. No evidence of any pulmonic regurgitation. The left atrium is normal. Low normal left ventricular systolic function. There are left ventricular regional wall motion abnormalities.  See appended scoring diagram. Normal left ventricular diastolic function. Normal left  ventricular wall thickness. The left ventricular ejection fraction by visual estimation is 50 to 55%. The right atrium is normal. Normal right ventricular size, structure and function. Aortic root appearance and dimensions are normal. No evidence of pericardial effusion.   EKG:  EKG is not ordered today.  The ekg ordered 08/24/2020 demonstrates normal sinus rhythm, T wave inversion in the inferior leads without much in the way of Q waves.  The T wave inversions are slightly more prominent, but overall not much change from the previous tracing from July 02, 2020.  Note that there was no electrocardiogram performed during his hospitalization for stroke.  A couple of saved monitor strips showed normal sinus rhythm.   Recent Labs: 09/10/2020: ALT 52; BUN 11; Creatinine, Ser 1.12; Hemoglobin 16.3; Platelets 168; Potassium 4.4; Sodium 137  Recent Lipid Panel Labs (Brief)          Component Value Date/Time    CHOL 143 08/22/2020 0454    TRIG 165 (H) 08/22/2020 0454    HDL 65 08/22/2020 0454    CHOLHDL 2.2 08/22/2020 0454    VLDL 33 08/22/2020 0454    LDLCALC 45 08/22/2020 0454          Risk Assessment/Calculations:         Physical Exam:    VS:  BP (!) 152/98 (BP Location: Left Arm, Patient Position: Sitting, Cuff Size: Normal)  Pulse 94   Ht  (1.803 m)   Wt 176 lb 12.8 oz (80.2 kg)   BMI 24.66 kg/m         Wt Readings from Last 3 Encounters:  09/10/20 178 lb 7 oz (80.9 kg)  09/07/20 176 lb 12.8 oz (80.2 kg)  08/24/20 174 lb 12.8 oz (79.3 kg)        General: Alert, oriented x3, no distress, as always rather anxious Head: no evidence of trauma, PERRL, EOMI, no exophtalmos or lid lag, no myxedema, no xanthelasma; normal ears, nose and oropharynx Neck: normal jugular venous pulsations and no hepatojugular reflux; brisk carotid pulses without delay and no carotid bruits Chest: clear to auscultation, no signs of consolidation by percussion or palpation, normal fremitus,  symmetrical and full respiratory excursions Cardiovascular: normal position and quality of the apical impulse, regular rhythm, normal first and second heart sounds, no murmurs, rubs or gallops Abdomen: no tenderness or distention, no masses by palpation, no abnormal pulsatility or arterial bruits, normal bowel sounds, no hepatosplenomegaly Extremities: no clubbing, cyanosis or edema; 2+ radial, ulnar and brachial pulses bilaterally; 2+ right femoral, posterior tibial and dorsalis pedis pulses; 2+ left femoral, posterior tibial and dorsalis pedis pulses; no subclavian or femoral bruits Neurological: grossly nonfocal Psych: Normal mood and affect     ASSESSMENT:     No diagnosis found. PLAN:    In order of problems listed above:   CAD s/p recent IWMI/DES-RCA: Mr. Oakley reportedly has severe left main stenosis and other coronary lesions that require surgical revascularization (we do not have the Cath Lab report for the Cath Lab images despite repeated attempts on my part and by the staff from the Cath Lab).  Ideally, dual antiplatelet therapy should be continued for 12 months and any interruption should be avoided in the first 3 months following placement of the drug-eluting stent.   However, TCAR can be performed while he is still on dual antiplatelet therapy, whereas bypass surgery cannot.  In addition, the risk of recurrent stroke during bypass surgery would be very high at this juncture.  He has not had any angina in the months that has passed since his myocardial infarction.  Continue aspirin, Brilinta, carvedilol, rosuvastatin.  Unfortunately, he stopped his beta-blocker very abruptly which is not a great situation.  Risk of beta-blocker rebound.  It is important for him to be back at least on a modest amount of beta-blocker before his carotid surgery.  Started metoprolol 50 mg twice daily today. LICA stenosis with recent L hemispheric ischemic stroke: Risk of recurrent stroke events is  substantial in the absence of revascularization.  For TCAR tomorrow.  Important to avoid large hemodynamic swings in the perioperative period due to the presence of left main stenosis.  The risk of coronary complications is elevated, but there is no optimal solution to his complex situation. History of vasospastic angina: Apparently this was confirmed during cardiac catheterization.  He has been using a nitroglycerin spray off and on for years.  Records show that in the past he was treated with amlodipine and diltiazem, but he is no longer on any calcium channel blockers. Hypercholesterolemia: He has stopped the rosuvastatin.  I recommended starting him on PCSK9 inhibitors after his surgery.  He is worried about taking these medications due to a reported association with "myalgic encephalomyelitis "which is prevalent in his family.  I have found no evidence of such an association in my brief review of the literature. Discussed inclisiran, but he  is skeptical. Pointed out the galloping pace of his vascular problems and the importance of finding a long term solution to reduce risk. Smoking: important to quit. OK to use nicotine patch if this helps. HTN: Blood pressure control was excellent while he was taking carvedilol, but now both his systolic and diastolic blood pressures are quite high, possibly with a component of beta-blocker rebound.  Metoprolol started today.  This should not be interrupted in the perioperative period. PTSD/anxiety: Although we did not explore the roots of this in any detail today, it has clearly led him to make impulsive decisions regarding his medical care, sometimes to his detriment, in the past.  We should assist him with substitution treatment for nicotine withdrawal and benzodiazepines as needed during his hospitalization.  I am not sure whether or not he has findings to suggest alcohol dependency, but this should be considered if he becomes agitated or anxious. ED: Sildenafil is  on his medication list.  He understands that he should never use nitroglycerin or nitroglycerin containing products within 24 hours of taking this medication.            Medication Adjustments/Labs and Tests Ordered: Current medicines are reviewed at length with the patient today.  Concerns regarding medicines are outlined above. No orders of the defined types were placed in this encounter.       Meds ordered this encounter  Medications   metoprolol tartrate (LOPRESSOR) 50 MG tablet      Sig: Take 1 tablet (50 mg total) by mouth 2 (two) times daily.      Dispense:  180 tablet      Refill:  3      Discontinue Carvedilol      Patient Instructions  Medication Instructions:  STOP the Carvedilol START Metoprolol Tartrate 50 mg twice daily   *If you need a refill on your cardiac medications before your next appointment, please call your pharmacy*     Lab Work: None ordered If you have labs (blood work) drawn today and your tests are completely normal, you will receive your results only by: MyChart Message (if you have MyChart) OR A paper copy in the mail If you have any lab test that is abnormal or we need to change your treatment, we will call you to review the results.     Testing/Procedures: None ordered     Follow-Up: At [facility], you and your health needs are our priority.  As part of our continuing mission to provide you with exceptional heart care, we have created designated Provider Care Teams.  These Care Teams include your primary Cardiologist (physician) and Advanced Practice Providers (APPs -  Physician Assistants and Nurse Practitioners) who all work together to provide you with the care you need, when you need it.   We recommend signing up for the patient portal called "MyChart".  Sign up information is provided on this After Visit Summary.  MyChart is used to connect with patients for Virtual Visits (Telemedicine).  Patients are able to view lab/test results,  encounter notes, upcoming appointments, etc.  Non-urgent messages can be sent to your provider as well.   To learn more about what you can do with MyChart, go to [email or web address].     Your next appointment:   2-3 month(s)   The format for your next appointment:   In Person   Provider:   Thurmon FairMihai Croitoru, MD       Signed, Thurmon FairMihai Croitoru, MD  09/12/2020 10:59 AM  [facility]           Patient Instructions by Sandi Mariscal, RN at 09/07/2020 11:00 AM  Author: Sandi Mariscal, RN Author Type: Registered Nurse Filed: 09/07/2020 11:43 AM  Note Status: Addendum Cosign: Cosign Not Required Encounter Date: 09/07/2020  Editor: Sandi Mariscal, RN (Registered Nurse)      Prior Versions: 1. Sandi Mariscal, RN (Registered Nurse) at 09/07/2020 11:35 AM - Signed    Medication Instructions:  STOP the Carvedilol START Metoprolol Tartrate 50 mg twice daily   *If you need a refill on your cardiac medications before your next appointment, please call your pharmacy*       ADDENDUM: Interval H&P for 10/05/2020 cardiac cath. Pt now s/p TECAR without complication. He has continued to have frequent angina and is therefore referred for cardiac cath in setting of known recent inferior MI and severe residual left main disease. Procedure scheduled to evaluate anatomy for cardiac surgery and see if any interval change.   [name] 10/12/2020 5:42 PM

## 2020-10-15 ENCOUNTER — Inpatient Hospital Stay (HOSPITAL_COMMUNITY)
Admission: AD | Admit: 2020-10-15 | Discharge: 2020-10-22 | DRG: 236 | Disposition: A | Discharge status: Home / Self Care | Payer: Managed Care, Other (non HMO) | Attending: Cardiothoracic Surgery | Admitting: Cardiothoracic Surgery

## 2020-10-15 ENCOUNTER — Inpatient Hospital Stay (HOSPITAL_COMMUNITY): Payer: Managed Care, Other (non HMO)

## 2020-10-15 DIAGNOSIS — Z4682 Encounter for fitting and adjustment of non-vascular catheter: Secondary | ICD-10-CM

## 2020-10-15 DIAGNOSIS — Z79899 Other long term (current) drug therapy: Secondary | ICD-10-CM

## 2020-10-15 DIAGNOSIS — I6522 Occlusion and stenosis of left carotid artery: Secondary | ICD-10-CM | POA: Diagnosis present

## 2020-10-15 DIAGNOSIS — F17210 Nicotine dependence, cigarettes, uncomplicated: Secondary | ICD-10-CM | POA: Diagnosis present

## 2020-10-15 DIAGNOSIS — Z888 Allergy status to other drugs, medicaments and biological substances status: Secondary | ICD-10-CM

## 2020-10-15 DIAGNOSIS — I25110 Atherosclerotic heart disease of native coronary artery with unstable angina pectoris: Principal | ICD-10-CM | POA: Diagnosis present

## 2020-10-15 DIAGNOSIS — Z7901 Long term (current) use of anticoagulants: Secondary | ICD-10-CM

## 2020-10-15 DIAGNOSIS — Z01811 Encounter for preprocedural respiratory examination: Secondary | ICD-10-CM

## 2020-10-15 DIAGNOSIS — J9811 Atelectasis: Secondary | ICD-10-CM | POA: Diagnosis not present

## 2020-10-15 DIAGNOSIS — Z7982 Long term (current) use of aspirin: Secondary | ICD-10-CM | POA: Diagnosis not present

## 2020-10-15 DIAGNOSIS — I69331 Monoplegia of upper limb following cerebral infarction affecting right dominant side: Secondary | ICD-10-CM | POA: Diagnosis not present

## 2020-10-15 DIAGNOSIS — Z8249 Family history of ischemic heart disease and other diseases of the circulatory system: Secondary | ICD-10-CM | POA: Diagnosis not present

## 2020-10-15 DIAGNOSIS — D696 Thrombocytopenia, unspecified: Secondary | ICD-10-CM | POA: Diagnosis not present

## 2020-10-15 DIAGNOSIS — R7303 Prediabetes: Secondary | ICD-10-CM | POA: Diagnosis present

## 2020-10-15 DIAGNOSIS — Z951 Presence of aortocoronary bypass graft: Secondary | ICD-10-CM

## 2020-10-15 DIAGNOSIS — I10 Essential (primary) hypertension: Secondary | ICD-10-CM | POA: Diagnosis present

## 2020-10-15 DIAGNOSIS — I69328 Other speech and language deficits following cerebral infarction: Secondary | ICD-10-CM | POA: Diagnosis not present

## 2020-10-15 DIAGNOSIS — I2510 Atherosclerotic heart disease of native coronary artery without angina pectoris: Secondary | ICD-10-CM

## 2020-10-15 DIAGNOSIS — G629 Polyneuropathy, unspecified: Secondary | ICD-10-CM | POA: Diagnosis present

## 2020-10-15 DIAGNOSIS — Z9889 Other specified postprocedural states: Secondary | ICD-10-CM

## 2020-10-15 DIAGNOSIS — F4310 Post-traumatic stress disorder, unspecified: Secondary | ICD-10-CM | POA: Diagnosis present

## 2020-10-15 DIAGNOSIS — Z20822 Contact with and (suspected) exposure to covid-19: Secondary | ICD-10-CM | POA: Diagnosis present

## 2020-10-15 DIAGNOSIS — I252 Old myocardial infarction: Secondary | ICD-10-CM

## 2020-10-15 DIAGNOSIS — E785 Hyperlipidemia, unspecified: Secondary | ICD-10-CM | POA: Diagnosis present

## 2020-10-15 DIAGNOSIS — K219 Gastro-esophageal reflux disease without esophagitis: Secondary | ICD-10-CM | POA: Diagnosis present

## 2020-10-15 DIAGNOSIS — D62 Acute posthemorrhagic anemia: Secondary | ICD-10-CM | POA: Diagnosis not present

## 2020-10-15 DIAGNOSIS — Z01810 Encounter for preprocedural cardiovascular examination: Secondary | ICD-10-CM | POA: Diagnosis not present

## 2020-10-15 DIAGNOSIS — Z955 Presence of coronary angioplasty implant and graft: Secondary | ICD-10-CM

## 2020-10-15 DIAGNOSIS — J939 Pneumothorax, unspecified: Secondary | ICD-10-CM

## 2020-10-15 DIAGNOSIS — E8770 Fluid overload, unspecified: Secondary | ICD-10-CM | POA: Diagnosis not present

## 2020-10-15 LAB — CBC WITH DIFFERENTIAL/PLATELET
Abs Immature Granulocytes: 0.08 10*3/uL — ABNORMAL HIGH (ref 0.00–0.07)
Basophils Absolute: 0.1 10*3/uL (ref 0.0–0.1)
Basophils Relative: 1 %
Eosinophils Absolute: 0.1 10*3/uL (ref 0.0–0.5)
Eosinophils Relative: 1 %
HCT: 46.7 % (ref 39.0–52.0)
Hemoglobin: 16.2 g/dL (ref 13.0–17.0)
Immature Granulocytes: 1 %
Lymphocytes Relative: 28 %
Lymphs Abs: 2.7 10*3/uL (ref 0.7–4.0)
MCH: 36 pg — ABNORMAL HIGH (ref 26.0–34.0)
MCHC: 34.7 g/dL (ref 30.0–36.0)
MCV: 103.8 fL — ABNORMAL HIGH (ref 80.0–100.0)
Monocytes Absolute: 0.8 10*3/uL (ref 0.1–1.0)
Monocytes Relative: 8 %
Neutro Abs: 5.8 10*3/uL (ref 1.7–7.7)
Neutrophils Relative %: 61 %
Platelets: 219 10*3/uL (ref 150–400)
RBC: 4.5 MIL/uL (ref 4.22–5.81)
RDW: 14.5 % (ref 11.5–15.5)
WBC: 9.6 10*3/uL (ref 4.0–10.5)
nRBC: 0 % (ref 0.0–0.2)

## 2020-10-15 LAB — ECHOCARDIOGRAM COMPLETE
Area-P 1/2: 3.92 cm2
S' Lateral: 2.6 cm

## 2020-10-15 LAB — MAGNESIUM: Magnesium: 2.3 mg/dL (ref 1.7–2.4)

## 2020-10-15 LAB — COMPREHENSIVE METABOLIC PANEL
ALT: 60 U/L — ABNORMAL HIGH (ref 0–44)
AST: 51 U/L — ABNORMAL HIGH (ref 15–41)
Albumin: 3.8 g/dL (ref 3.5–5.0)
Alkaline Phosphatase: 56 U/L (ref 38–126)
Anion gap: 11 (ref 5–15)
BUN: 11 mg/dL (ref 6–20)
CO2: 24 mmol/L (ref 22–32)
Calcium: 9.2 mg/dL (ref 8.9–10.3)
Chloride: 101 mmol/L (ref 98–111)
Creatinine, Ser: 1.07 mg/dL (ref 0.61–1.24)
GFR, Estimated: >60 mL/min (ref >60)
Glucose, Bld: 99 mg/dL (ref 70–99)
Potassium: 4.2 mmol/L (ref 3.5–5.1)
Sodium: 136 mmol/L (ref 135–145)
Total Bilirubin: 0.8 mg/dL (ref 0.3–1.2)
Total Protein: 6.4 g/dL — ABNORMAL LOW (ref 6.5–8.1)

## 2020-10-15 LAB — PULMONARY FUNCTION TEST
FEF 25-75 Pre: 2.03 L/sec
FEF2575-%Pred-Pre: 66 %
FEV1-%Pred-Pre: 69 %
FEV1-Pre: 2.59 L
FEV1FVC-%Pred-Pre: 99 %
FEV6-%Pred-Pre: 71 %
FEV6-Pre: 3.37 L
FEV6FVC-%Pred-Pre: 102 %
FVC-%Pred-Pre: 69 %
FVC-Pre: 3.43 L
Pre FEV1/FVC ratio: 75 %
Pre FEV6/FVC Ratio: 98 %

## 2020-10-15 LAB — MRSA NEXT GEN BY PCR, NASAL: MRSA by PCR Next Gen: NOT DETECTED

## 2020-10-15 LAB — PROTIME-INR
INR: 1 (ref 0.8–1.2)
Prothrombin Time: 12.7 seconds (ref 11.4–15.2)

## 2020-10-15 LAB — SARS CORONAVIRUS 2 (TAT 6-24 HRS): SARS Coronavirus 2: NEGATIVE

## 2020-10-15 MED ORDER — NITROGLYCERIN 0.4 MG SL SUBL: 0.4000 mg | SUBLINGUAL_TABLET | SUBLINGUAL | Status: DC | PRN

## 2020-10-15 MED ORDER — METOPROLOL TARTRATE 50 MG PO TABS
50.0000 mg | ORAL_TABLET | Freq: Two times a day (BID) | ORAL | Status: DC
  Administered 2020-10-15 – 2020-10-16 (×3): 50 mg via ORAL
  Filled 2020-10-15 (×3): qty 1

## 2020-10-15 MED ORDER — PANTOPRAZOLE SODIUM 40 MG PO TBEC
40.0000 mg | DELAYED_RELEASE_TABLET | Freq: Every day | ORAL | Status: DC
  Administered 2020-10-16: 40 mg via ORAL
  Filled 2020-10-15: qty 1

## 2020-10-15 MED ORDER — NICOTINE 21 MG/24HR TD PT24
21.0000 mg | MEDICATED_PATCH | Freq: Every day | TRANSDERMAL | Status: DC
  Administered 2020-10-15 – 2020-10-22 (×7): 21 mg via TRANSDERMAL
  Filled 2020-10-15 (×7): qty 1

## 2020-10-15 MED ORDER — SODIUM CHLORIDE 0.9 % IV SOLN
0.7500 ug/kg/min | INTRAVENOUS | Status: DC
  Administered 2020-10-15 – 2020-10-16 (×2): 0.75 ug/kg/min via INTRAVENOUS
  Filled 2020-10-15 (×4): qty 50

## 2020-10-15 MED ORDER — ALPRAZOLAM 0.5 MG PO TABS
1.0000 mg | ORAL_TABLET | Freq: Three times a day (TID) | ORAL | Status: DC | PRN
  Administered 2020-10-15 – 2020-10-17 (×5): 1 mg via ORAL
  Filled 2020-10-15 (×5): qty 2

## 2020-10-15 MED ORDER — HEPARIN SODIUM (PORCINE) 5000 UNIT/ML IJ SOLN
5000.0000 [IU] | Freq: Three times a day (TID) | INTRAMUSCULAR | Status: DC
  Administered 2020-10-15 – 2020-10-17 (×6): 5000 [IU] via SUBCUTANEOUS
  Filled 2020-10-15 (×6): qty 1

## 2020-10-15 MED ORDER — ASPIRIN EC 81 MG PO TBEC
81.0000 mg | DELAYED_RELEASE_TABLET | Freq: Every day | ORAL | Status: DC
  Administered 2020-10-16: 81 mg via ORAL
  Filled 2020-10-15: qty 1

## 2020-10-15 NOTE — Progress Notes (Signed)
  Echocardiogram 2D Echocardiogram has been performed.  Corey Salas 10/15/2020, 4:24 PM

## 2020-10-15 NOTE — H&P (Addendum)
Cardiology Admission History and Physical:   Patient ID: Corey Salas MRN: [id number]; DOB: [birth year]   Admission date: 10/15/2020  PCP:  Pcp, No   [facility] Providers Cardiologist: Dr Sharlet Salinaroitiru, in the process of transitioning to Dr. Izora Ribashandrasekhar  Chief Complaint:  waiting for CABG   Patient Profile:   Corey Salas is a 60 y.o. 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    History of Present Illness:   Corey Salas see Dr Sharlet Salinaroitiru outpatient, had [facility] [place] [facility] and established care here since 07/02/20.   He has history of multivessel CAD with multiple percutaneous revascularization procedures including DES to RCA in 2006 in [place], DES to RCA and LAD in 2009 in [place], stent x2 in 2015 at [place] of unclear vessel, and most recent acute inferior MI on 08/10/20 s/p DES to RCA in [place] and found to have severe disease in left main and multivessel stenosis. It was decided for him to undergo cardiac bypass after 5 weeks of DAPT with the stent placement,  due to patient " kept having cardiac arrest cath lab table". Later he moved to [place] with his wife and wants to get surgery done here. During one of his catheterizations he was noted to have vasospasm and has been diagnosed with Prinzmetal's angina (Dr. Collie Siadichard Wilks).    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   He was last seen  her by  Dr Excell Seltzerooper on 10/05/2020 for outpatient left cardiac cath,  he had no further angina over the past months, continued on ASA, Brilinta, coreg. He self discontinued BB due to intolerance, this was resumed with  metoprolol 50mg  BID. He is intolerant to statin. LHC from 10/05/20 showed severe diffuse 80% left main stenosis, patent proximal LAD stent with mild nonobstructive LAD stenosis, patent left circumflex and ramus intermedius, patent RCA with 60-70% proximal stenosis between 2 stents and 70% ostial PDA stenosis. He was recommend CABG consultation.   He was seen by CTS Dr Vickey SagesAtkins on 10/10/2020 due to 80% left main disease, he was recommend CABG as soon as possible. He was instructed to stop Brilinta on Friday AM 10/12/20 by CTS. He is planned for CABG on Wednesday 10/17/20. He states he a tiny bit of chest pain since he stopped Brilinta, but not severe enough for him take any Nitro yet. He is still smoking 1  PPD, wishes nicotine oatch to be ordered. He states he did not take his Xanax and is very nervous today. He denied any SOB, fever, chills, N/V/D, abdominal pain, active bleeding.      Past Medical History:  Diagnosis Date   Anginal pain (HCC)    CAD (coronary artery disease)    Chest pain    Dyspnea    08/23/20- "just before my heart attack, it is normal now, unless I I am stressed/"   Family history of adverse reaction to anesthesia 08/21/2020   Hypertension    Neuromuscular disorder (HCC)    neuropathy in both  feet   Neuropathy    "bottom of my feet, they say it is from alcohol, I just drink beer now, not spirits."   PTSD (post-traumatic stress disorder)    PTSD (post-traumatic stress disorder)    STEMI (ST elevation myocardial infarction) (HCC) 08/06/2020   Stroke (HCC)    08/23/20 slight weakness in right hand - can type and write- "I have to concentrate to keep pen in hand."    Past Surgical History:  Procedure Laterality Date   APPENDECTOMY     CORONARY ANGIOPLASTY WITH STENT PLACEMENT      6X   FRACTURE SURGERY     right tibia repair   LEFT HEART CATH AND CORONARY ANGIOGRAPHY N/A 10/05/2020   Procedure: LEFT HEART CATH AND CORONARY ANGIOGRAPHY;  Surgeon: [name], MD;  Location: [facility] INVASIVE CV LAB;  Service: Cardiovascular;  Laterality: N/A;   TRANSCAROTID ARTERY REVASCULARIZATION  Left 09/13/2020   Procedure: TRANSCAROTID ARTERY REVASCULARIZATION LEFT;  Surgeon: Leonie Douglas, MD;  Location: [facility] OR;  Service: Vascular;  Laterality: Left;     Medications Prior to Admission: Prior to Admission medications   Medication Sig Start Date End Date Taking? Authorizing Provider  ALPRAZolam Prudy Feeler) 1 MG tablet Take half a tablet to one tablet every 8 hours as needed for anxiety. Patient taking differently: Take 1 mg by mouth every 8 (eight) hours as needed for anxiety. 08/24/20   Croitoru, Mihai, MD  aspirin EC 81 MG tablet Take 1 tablet (81 mg total) by mouth daily. Swallow whole. 07/02/20   Tobb, Kardie, DO  BRILINTA 90 MG TABS tablet Take 90 mg by mouth 2 (two) times daily. 08/07/20   [provider]  esomeprazole (NEXIUM) 20 MG capsule Take 20 mg by mouth daily at 12 noon.    [provider]  metoprolol tartrate (LOPRESSOR) 50 MG tablet Take 1 tablet (50 mg total) by mouth 2 (two) times daily. 09/07/20 12/06/20  Croitoru, Mihai, MD  Multiple Vitamins-Minerals (CENTRUM SILVER 50+MEN) TABS Take 1 tablet by mouth daily.    [provider]  nitroGLYCERIN (NITROLINGUAL) 0.4 MG/SPRAY spray Place 1 spray under the tongue daily as needed for chest pain. 06/29/20   [provider]  nitroGLYCERIN (NITROSTAT) 0.4 MG SL tablet Place 0.4 mg under the tongue every 5 (five) minutes as needed for chest pain.    [provider]  oxyCODONE-acetaminophen (PERCOCET/ROXICET) 5-325 MG tablet Take 1 tablet by mouth every 6 (six) hours as needed for moderate pain. 09/14/20   Lars Mage, PA-C     Allergies:    Allergies  Allergen Reactions   Atorvastatin      Swelling  "blew up" all over, hospitalized (2018 in [place])     Rosuvastatin Diarrhea    Crestor [place]2020 [place]   Pt stopped taking ~ end of April 2022,  diarrhea resolved ~36 hours later.  Cardiologist Dr. Royann Shivers is aware he is not taking and will re-address lipid treatment after vascular procedures completed.     Social History:   Social History   Socioeconomic History   Marital status: Married    Spouse name: Not on file   Number of children: Not on file   Years of education: Not on file   Highest education level: Not on file  Occupational History   Not on file  Tobacco Use   Smoking status: Every Day    Packs/day: 1.00    Pack years: 0.00    Types: Cigarettes  Smokeless tobacco: Never   Tobacco comments:    Patient rolls his cigarettes- equaliavant to 1 pakg a day.  Vaping Use   Vaping Use: Never used  Substance and Sexual Activity   Alcohol use: Yes    Alcohol/week: 48.0 standard drinks    Types: 48 Cans of beer per week   Drug use: Never   Sexual activity: Not on file  Other Topics Concern   Not on file  Social History Narrative   Not on file   Social Determinants of Health   Financial Resource Strain: Not on file  Food Insecurity: No Food Insecurity   Worried About Running Out of Food in the Last Year: Never true   Ran Out of Food in the Last Year: Never true  Transportation Needs: No Transportation Needs   Lack of Transportation (Medical): No   Lack of Transportation (Non-Medical): No  Physical Activity: Not on file  Stress: No Stress Concern Present   Feeling of Stress : Only a little  Social Connections: Not on file  Intimate Partner Violence: Not on file    Family History:   The patient's family history includes Heart attack in his father and mother.    ROS:   Constitutional: Denied fever, chills, malaise, night sweats Eyes: Denied vision change or loss Ears/Nose/Mouth/Throat: Denied ear ache, sore  throat, coughing, sinus pain Cardiovascular: see HPI  Respiratory: denied shortness of breath Gastrointestinal: Denied nausea, vomiting, abdominal pain, diarrhea Genital/Urinary: Denied dysuria, hematuria, urinary frequency/urgency Musculoskeletal: Denied muscle ache, joint pain, weakness Skin: Denied rash, wound Neuro: slurred speech and right arm weakness from recent CVA  Psych: PTSD  Endocrine: Denied history of pre-diabetes    Physical Exam/Data:   Vitals:   10/15/20 1410  BP: (!) 154/100  Pulse: 77  Resp: 17  Temp: (P) 98 F (36.7 C)  TempSrc: (P) Oral  SpO2: 97%   No intake or output data in the 24 hours ending 10/15/20 1514 Last 3 Weights 10/10/2020 10/05/2020 09/13/2020  Weight (lbs) 175 lb 175 lb 178 lb 5.6 oz  Weight (kg) 79.379 kg 79.379 kg 80.9 kg     There is no height or weight on file to calculate BMI.   Vitals:  Vitals:   10/15/20 1410  BP: (!) 154/100  Pulse: 77  Resp: 17  Temp: (P) 98 F (36.7 C)  SpO2: 97%   General Appearance: In no apparent distress, laying in bed HEENT: Normocephalic, atraumatic. EOMs intact.  Neck: Supple, trachea midline, no JVDs, left neck surgical incision intact  Cardiovascular: Regular rate and rhythm, normal S1-S2,  no murmur/rub/gallop Respiratory: Resting breathing unlabored, lungs sounds clear to auscultation bilaterally, no use of accessory muscles. On room air.  No wheezes, rales or rhonchi.   Gastrointestinal: Bowel sounds positive, abdomen soft, non-tender, non-distended.  Extremities: Able to move all extremities in bed without difficulty, no edema/cyanosis/clubbing Genitourinary: genital exam not performed Musculoskeletal: Normal muscle bulk and tone Skin: Intact, warm, dry. No rashes or petechiae noted in exposed areas.  Neurologic: Alert, oriented to person, place and time. Slurred speech, no cognitive deficit,  no gross focal neuro deficit Psychiatric: anxious   EKG:  EKG ordered today   Relevant CV  Studies:  Cardiac cath 10/05/20:  Previously placed Mid RCA stent (unknown type) is widely patent. Prox RCA-1 lesion is 25% stenosed. Prox RCA-2 lesion is 70% stenosed. RPDA lesion is 70% stenosed. Ost LM to Mid LM lesion is 80% stenosed. 2nd Mrg lesion is 50% stenosed. Prox LAD lesion  is 30% stenosed.   1. Severe diffuse 80% left main stenosis 2. Patent proximal LAD stent with mild nonobstructive LAD stenosis seen on limited angiography 3. Patent left circumflex and ramus intermedius seen on limited coronary imaging 4. Patent RCA with 60-70% proximal stenosis between 2 stents and 70% ostial PDA stenosis   Recommend: Cardiac surgical consultation for CABG Will need to determine plan for brilinta washout in this patient who is less than 2 months out from his inferior MI Continue anti-anginal medical therapy   Laboratory Data:  High Sensitivity Troponin:  No results for input(s): TROPONINIHS in the last 720 hours.    ChemistryNo results for input(s): NA, K, CL, CO2, GLUCOSE, BUN, CREATININE, CALCIUM, GFRNONAA, GFRAA, ANIONGAP in the last 168 hours.  No results for input(s): PROT, ALBUMIN, AST, ALT, ALKPHOS, BILITOT in the last 168 hours. HematologyNo results for input(s): WBC, RBC, HGB, HCT, MCV, MCH, MCHC, RDW, PLT in the last 168 hours. BNPNo results for input(s): BNP, PROBNP in the last 168 hours.  DDimer No results for input(s): DDIMER in the last 168 hours.   Radiology/Studies:  No results found.   Assessment and Plan:   Multivessel CAD -History of multiple PCI's, most recently had acute inferior MI in [place], complicated case required PCI of RCA of 08/10/2020, also found to have severe left main disease needing CABG which is confirmed on cath on 10/05/20, on DAPT with aspirin and Brilinta, Brilinta has been stopped on Friday 10/12/20 by CTS,  transferred here for antiplatelet bridging before CABG per CTS  - will obtain admission labs including CBC diff, CMP, Mag, INR, COVID  swab - will obtain EKG, Echo  - will resume medical therapy with ASA , Metoprolol  BID, not tolerant to statin, Brilinta stopped since AM on 10/12/20 per CTS for wash out  - discussed with Dr Allyson Sabal, recommend cangrelor bridging to prevent in-stent stenosis, discussed with pharmacy team, in the setting of anticipating CTS, recommend to start cangrelor at 0.75 mcg/kg/min today without bolus.   HTN - BP mildly elevated with anxiety currently, resume metoprolol  BID   HLD - LDL 83 on 09/14/20, he can't tolerate statin, refer to lipid clinic after DC, consider Zetia   Hx of recent CVA post MI on 08/2020 symptomatic left carotid artery stenosis  - s/p left transcarotid revascularization with stent placement on 09/13/2020  Pre-diabetes - A1C 5.7% on 08/22/20, start [facility] diet, may add insulin SSI if become hyperglycemic   PTSD - resume Xanax  Q8H PRN, home dose, PDMP reviewed   GERD - resume PPI  Tobacco abuse  - still smokes 1PPD, start nicotine patch today   DVT prophylaxis - start SQ heparin 5000 unit Q8H     Risk Assessment/Risk Scores:     Severity of Illness: The appropriate patient status for this patient is INPATIENT. Inpatient status is judged to be reasonable and necessary in order to provide the required intensity of service to ensure the patient's safety. The patient's presenting symptoms, physical exam findings, and initial radiographic and laboratory data in the context of their chronic comorbidities is felt to place them at high risk for further clinical deterioration. Furthermore, it is not anticipated that the patient will be medically stable for discharge from the hospital within 2 midnights of admission. The following factors support the patient status of inpatient.   " The patient's presenting symptoms include intermittent angina. " The worrisome physical exam findings include N.A. " The initial radiographic and laboratory data are worrisome because  of  N/A. " The chronic co-morbidities include multivessel CAD, recent CVA, HTN, HLD, pre-diabetes, smoking.   * I certify that at the point of admission it is my clinical judgment that the patient will require inpatient hospital care spanning beyond 2 midnights from the point of admission due to high intensity of service, high risk for further deterioration and high frequency of surveillance required.*   For questions or updates, please contact [facility] Please consult [email or web address] for contact info under     Signed, Cyndi Bender, NP  10/15/2020 3:14 PM    Agree with note by Cyndi Bender NP  Corey Salas is a 60 year old married Caucasian male with multiple cardiac risk factors including prediabetes, ongoing tobacco abuse, essential hypertension and hyperlipidemia.  He has had multiple stents in the past most recently in [place] back in April of his RCA.  He had remote LAD stenting as well.  He recently had carotid stenting with stroke by Dr. Lenell Antu  last month.  He was supposed to have a staged CABG in [place] but came to [place].  He underwent diagnostic coronary angiography by Dr. Excell Seltzer last week revealing patent RCA stents with a 60 to 70% stenosis between his 2 stents proximally, ostial and mid PDA disease and diffuse high-grade left main.  He was on aspirin and Brilinta.  Brilinta was discontinued on Friday in anticipation of CABG this coming Wednesday.  He was admitted for cangrelor bridge.  2D echo at bedside today revealed normal LV function.  He has been asymptomatic since his heart cath and is otherwise clinically stable.  Runell Gess, M.D., FACP, [facility], [facility] [address] [place], [place]  [address]  [phone] 10/15/2020 4:04 PM

## 2020-10-16 ENCOUNTER — Encounter (HOSPITAL_COMMUNITY): Payer: Managed Care, Other (non HMO)

## 2020-10-16 ENCOUNTER — Inpatient Hospital Stay (HOSPITAL_COMMUNITY): Payer: Managed Care, Other (non HMO)

## 2020-10-16 DIAGNOSIS — Z01810 Encounter for preprocedural cardiovascular examination: Secondary | ICD-10-CM

## 2020-10-16 LAB — CBC
HCT: 45.5 % (ref 39.0–52.0)
Hemoglobin: 15.5 g/dL (ref 13.0–17.0)
MCH: 35.7 pg — ABNORMAL HIGH (ref 26.0–34.0)
MCHC: 34.1 g/dL (ref 30.0–36.0)
MCV: 104.8 fL — ABNORMAL HIGH (ref 80.0–100.0)
Platelets: 204 10*3/uL (ref 150–400)
RBC: 4.34 MIL/uL (ref 4.22–5.81)
RDW: 14.6 % (ref 11.5–15.5)
WBC: 8.4 10*3/uL (ref 4.0–10.5)
nRBC: 0 % (ref 0.0–0.2)

## 2020-10-16 LAB — TYPE AND SCREEN
ABO/RH(D): A POS
Antibody Screen: NEGATIVE

## 2020-10-16 LAB — BASIC METABOLIC PANEL
Anion gap: 10 (ref 5–15)
BUN: 14 mg/dL (ref 6–20)
CO2: 23 mmol/L (ref 22–32)
Calcium: 9.3 mg/dL (ref 8.9–10.3)
Chloride: 102 mmol/L (ref 98–111)
Creatinine, Ser: 1.12 mg/dL (ref 0.61–1.24)
GFR, Estimated: >60 mL/min (ref >60)
Glucose, Bld: 97 mg/dL (ref 70–99)
Potassium: 4.1 mmol/L (ref 3.5–5.1)
Sodium: 135 mmol/L (ref 135–145)

## 2020-10-16 LAB — APTT: aPTT: 31 seconds (ref 24–36)

## 2020-10-16 MED ORDER — BISACODYL 5 MG PO TBEC: 5.0000 mg | DELAYED_RELEASE_TABLET | Freq: Once | ORAL | Status: DC

## 2020-10-16 MED ORDER — PLASMA-LYTE A IV SOLN
INTRAVENOUS | Status: DC
  Filled 2020-10-16: qty 5

## 2020-10-16 MED ORDER — NOREPINEPHRINE 4 MG/250ML-% IV SOLN
0.0000 ug/min | INTRAVENOUS | Status: DC
  Filled 2020-10-16: qty 250

## 2020-10-16 MED ORDER — VANCOMYCIN HCL 1250 MG/250ML IV SOLN
1250.0000 mg | INTRAVENOUS | Status: AC
  Administered 2020-10-17: 1250 mg via INTRAVENOUS
  Filled 2020-10-16: qty 250

## 2020-10-16 MED ORDER — SODIUM CHLORIDE 0.9 % IV SOLN
INTRAVENOUS | Status: DC
  Filled 2020-10-16: qty 30

## 2020-10-16 MED ORDER — CHLORHEXIDINE GLUCONATE 0.12 % MT SOLN
15.0000 mL | Freq: Once | OROMUCOSAL | Status: AC
Start: 2020-10-17 — End: 2020-10-17
  Administered 2020-10-17: 15 mL via OROMUCOSAL
  Filled 2020-10-16: qty 15

## 2020-10-16 MED ORDER — TRANEXAMIC ACID 1000 MG/10ML IV SOLN
1.5000 mg/kg/h | INTRAVENOUS | Status: AC
  Administered 2020-10-17: 1.5 mg/kg/h via INTRAVENOUS
  Filled 2020-10-16: qty 25

## 2020-10-16 MED ORDER — EPINEPHRINE HCL 5 MG/250ML IV SOLN IN NS
0.0000 ug/min | INTRAVENOUS | Status: AC
  Administered 2020-10-17: 2 ug/min via INTRAVENOUS
  Filled 2020-10-16: qty 250

## 2020-10-16 MED ORDER — CEFAZOLIN SODIUM-DEXTROSE 2-4 GM/100ML-% IV SOLN
2.0000 g | INTRAVENOUS | Status: DC
  Filled 2020-10-16: qty 100

## 2020-10-16 MED ORDER — MILRINONE LACTATE IN DEXTROSE 20-5 MG/100ML-% IV SOLN
0.3000 ug/kg/min | INTRAVENOUS | Status: DC
  Filled 2020-10-16: qty 100

## 2020-10-16 MED ORDER — CEFAZOLIN SODIUM-DEXTROSE 2-4 GM/100ML-% IV SOLN
2.0000 g | INTRAVENOUS | Status: AC
  Administered 2020-10-17 (×2): 2 g via INTRAVENOUS
  Filled 2020-10-16: qty 100

## 2020-10-16 MED ORDER — MAGNESIUM SULFATE 50 % IJ SOLN
40.0000 meq | INTRAMUSCULAR | Status: DC
  Filled 2020-10-16: qty 9.85

## 2020-10-16 MED ORDER — POTASSIUM CHLORIDE 2 MEQ/ML IV SOLN
80.0000 meq | INTRAVENOUS | Status: DC
  Filled 2020-10-16: qty 40

## 2020-10-16 MED ORDER — CHLORHEXIDINE GLUCONATE CLOTH 2 % EX PADS
6.0000 | MEDICATED_PAD | Freq: Once | CUTANEOUS | Status: AC
  Administered 2020-10-16: 6 via TOPICAL

## 2020-10-16 MED ORDER — TRANEXAMIC ACID (OHS) PUMP PRIME SOLUTION
2.0000 mg/kg | INTRAVENOUS | Status: DC
  Filled 2020-10-16: qty 1.59

## 2020-10-16 MED ORDER — DEXMEDETOMIDINE HCL IN NACL 400 MCG/100ML IV SOLN
0.1000 ug/kg/h | INTRAVENOUS | Status: AC
Start: 2020-10-17 — End: 2020-10-17
  Administered 2020-10-17: .7 ug/kg/h via INTRAVENOUS
  Filled 2020-10-16: qty 100

## 2020-10-16 MED ORDER — PHENYLEPHRINE HCL-NACL 20-0.9 MG/250ML-% IV SOLN
30.0000 ug/min | INTRAVENOUS | Status: AC
  Administered 2020-10-17: 20 ug/min via INTRAVENOUS
  Filled 2020-10-16: qty 250

## 2020-10-16 MED ORDER — NITROGLYCERIN IN D5W 200-5 MCG/ML-% IV SOLN
2.0000 ug/min | INTRAVENOUS | Status: AC
  Administered 2020-10-17: 10 ug/min via INTRAVENOUS
  Filled 2020-10-16: qty 250

## 2020-10-16 MED ORDER — CHLORHEXIDINE GLUCONATE CLOTH 2 % EX PADS
6.0000 | MEDICATED_PAD | Freq: Once | CUTANEOUS | Status: AC
  Administered 2020-10-17: 6 via TOPICAL

## 2020-10-16 MED ORDER — METOPROLOL TARTRATE 12.5 MG HALF TABLET
12.5000 mg | ORAL_TABLET | Freq: Once | ORAL | Status: AC
  Administered 2020-10-17: 12.5 mg via ORAL
  Filled 2020-10-16: qty 1

## 2020-10-16 MED ORDER — TRANEXAMIC ACID (OHS) BOLUS VIA INFUSION
15.0000 mg/kg | INTRAVENOUS | Status: AC
  Administered 2020-10-17: 1192.5 mg via INTRAVENOUS
  Filled 2020-10-16: qty 1193

## 2020-10-16 MED ORDER — TEMAZEPAM 15 MG PO CAPS
15.0000 mg | ORAL_CAPSULE | Freq: Once | ORAL | Status: AC | PRN
  Administered 2020-10-17: 15 mg via ORAL
  Filled 2020-10-16: qty 1

## 2020-10-16 MED ORDER — INSULIN REGULAR(HUMAN) IN NACL 100-0.9 UT/100ML-% IV SOLN
INTRAVENOUS | Status: AC
  Administered 2020-10-17: .7 [IU]/h via INTRAVENOUS
  Filled 2020-10-16: qty 100

## 2020-10-16 NOTE — Plan of Care (Signed)
  Problem: Education: Goal: Knowledge of General Education information will improve Description: Including pain rating scale, medication(s)/side effects and non-pharmacologic comfort measures Outcome: Progressing   Problem: Clinical Measurements: Goal: Ability to maintain clinical measurements within normal limits will improve Outcome: Progressing   Problem: Clinical Measurements: Goal: Will remain free from infection Outcome: Progressing   Problem: Clinical Measurements: Goal: Diagnostic test results will improve Outcome: Progressing   Problem: Activity: Goal: Risk for activity intolerance will decrease Outcome: Progressing   Problem: Nutrition: Goal: Adequate nutrition will be maintained Outcome: Progressing   Problem: Coping: Goal: Level of anxiety will decrease Outcome: Progressing   Problem: Pain Managment: Goal: General experience of comfort will improve Outcome: Progressing

## 2020-10-16 NOTE — Progress Notes (Signed)
Pre-CABG testing has been completed. Preliminary results can be found in CV Proc through chart review.   10/16/20 4:55 PM Olen Cordial RVT

## 2020-10-16 NOTE — Anesthesia Preprocedure Evaluation (Addendum)
Anesthesia Evaluation  Patient identified by MRN, date of birth, ID band Patient awake    Reviewed: Allergy & Precautions, NPO status , Patient's Chart, lab work & pertinent test results, reviewed documented beta blocker date and time   History of Anesthesia Complications Negative for: history of anesthetic complications  Airway Mallampati: II  TM Distance: >3 FB Neck ROM: Full    Dental  (+) Dental Advisory Given, Poor Dentition, Chipped   Pulmonary Current Smoker and Patient abstained from smoking.,    Pulmonary exam normal        Cardiovascular hypertension, Pt. on medications and Pt. on home beta blockers + CAD, + Past MI and + Cardiac Stents  Normal cardiovascular exam   '22 Carotid US - 1-39% right ICAS, <50% stenosis in left ICAS (stent present)  '22 Cath - Previously placed Mid RCA stent (unknown type) is widely patent. Prox RCA-1 lesion is 25% stenosed. Prox RCA-2 lesion is 70% stenosed. RPDA lesion is 70% stenosed. Ost LM to Mid LM lesion is 80% stenosed. 2nd Mrg lesion is 50% stenosed. Prox LAD lesion is 30% stenosed.  '22 TTE - EF 60-65%. Mild left ventricular hypertrophy.  Trivial MR.      Neuro/Psych PSYCHIATRIC DISORDERS Anxiety  Neuromuscular disease CVA, Residual Symptoms    GI/Hepatic negative GI ROS, Neg liver ROS,   Endo/Other  negative endocrine ROS  Renal/GU negative Renal ROS     Musculoskeletal negative musculoskeletal ROS (+)   Abdominal   Peds  Hematology  On brillinta    Anesthesia Other Findings Covid test negative   Reproductive/Obstetrics                            Anesthesia Physical Anesthesia Plan  ASA: 4  Anesthesia Plan: General   Post-op Pain Management:    Induction: Intravenous  PONV Risk Score and Plan: 2 and Treatment may vary due to age or medical condition  Airway Management Planned: Oral ETT  Additional Equipment: Arterial  line, CVP, PA Cath, TEE and Ultrasound Guidance Line Placement  Intra-op Plan:   Post-operative Plan: Post-operative intubation/ventilation  Informed Consent: I have reviewed the patients History and Physical, chart, labs and discussed the procedure including the risks, benefits and alternatives for the proposed anesthesia with the patient or authorized representative who has indicated his/her understanding and acceptance.     Dental advisory given  Plan Discussed with: CRNA and Anesthesiologist  Anesthesia Plan Comments:        Anesthesia Quick Evaluation

## 2020-10-16 NOTE — Progress Notes (Signed)
Progress Note  Patient Name: Corey Salas Date of Encounter: 10/16/2020  [facility] Cardiologist: Christell ConstantMahesh A Chandrasekhar, MD   Subjective   No chest pain or shortness of breath.  On cangrelor IV infusion.  Scheduled for CABG with Dr. Vickey SagesAtkins tomorrow.  Inpatient Medications    Scheduled Meds:  aspirin EC  81 mg Oral Daily   [START ON 10/17/2020] epinephrine  0-10 mcg/min Intravenous To OR   heparin  5,000 Units Subcutaneous Q8H   [START ON 10/17/2020] heparin-papaverine-plasmalyte irrigation   Irrigation To OR   [START ON 10/17/2020] insulin   Intravenous To OR   [START ON 10/17/2020] magnesium sulfate  40 mEq Other To OR   metoprolol tartrate  50 mg Oral BID   nicotine  21 mg Transdermal Daily   pantoprazole  40 mg Oral Daily   [START ON 10/17/2020] phenylephrine  30-200 mcg/min Intravenous To OR   [START ON 10/17/2020] potassium chloride  80 mEq Other To OR   [START ON 10/17/2020] tranexamic acid  15 mg/kg Intravenous To OR   [START ON 10/17/2020] tranexamic acid  2 mg/kg Intracatheter To OR   Continuous Infusions:  cangrelor 50 mg in NS 250 mL 0.7473 mcg/kg/min (10/16/20 0808)   [START ON 10/17/2020]  ceFAZolin (ANCEF) IV     [START ON 10/17/2020]  ceFAZolin (ANCEF) IV     [START ON 10/17/2020] dexmedetomidine     [START ON 10/17/2020] heparin 30,000 units/NS 1000 mL solution for CELLSAVER     [START ON 10/17/2020] milrinone     [START ON 10/17/2020] nitroGLYCERIN     [START ON 10/17/2020] norepinephrine     [START ON 10/17/2020] tranexamic acid (CYKLOKAPRON) infusion ([facility])     [START ON 10/17/2020] vancomycin     PRN Meds: ALPRAZolam, nitroGLYCERIN   Vital Signs    Vitals:   10/15/20 2111 10/15/20 2308 10/15/20 2344 10/16/20 0325  BP: 130/78 (!) 136/96 (!) 136/96 107/83  Pulse: 71 63 60 60  Resp:  16 (!) 21 20  Temp:  97.9 F (36.6 C) 97.8 F (36.6 C) 97.6 F (36.4 C)  TempSrc:  Oral Oral Oral  SpO2:  97% 96% 93%  Weight:      Height:        Intake/Output  Summary (Last 24 hours) at 10/16/2020 1004 Last data filed at 10/16/2020 0808 Gross per 24 hour  Intake 960.83 ml  Output --  Net 960.83 ml   Last 3 Weights 10/15/2020 10/10/2020 10/05/2020  Weight (lbs) 175 lb 4.3 oz 175 lb 175 lb  Weight (kg) 79.5 kg 79.379 kg 79.379 kg      Telemetry    Sinus rhythm- Personally Reviewed  ECG    Sinus rhythm at 67 with inferior T wave inversion.- Personally Reviewed  Physical Exam   GEN: No acute distress.   Neck: No JVD Cardiac: RRR, no murmurs, rubs, or gallops.  Respiratory: Clear to auscultation bilaterally. GI: Soft, nontender, non-distended  MS: No edema; No deformity. Neuro:  Nonfocal  Psych: Normal affect   Labs    High Sensitivity Troponin:  No results for input(s): TROPONINIHS in the last 720 hours.    Chemistry Recent Labs  Lab 10/15/20 1603 10/16/20 0014  NA 136 135  K 4.2 4.1  CL 101 102  CO2 24 23  GLUCOSE 99 97  BUN 11 14  CREATININE 1.07 1.12  CALCIUM 9.2 9.3  PROT 6.4*  --   ALBUMIN 3.8  --   AST 51*  --  ALT 60*  --   ALKPHOS 56  --   BILITOT 0.8  --   GFRNONAA >60 >60  ANIONGAP 11 10     Hematology Recent Labs  Lab 10/15/20 1603 10/16/20 0014  WBC 9.6 8.4  RBC 4.50 4.34  HGB 16.2 15.5  HCT 46.7 45.5  MCV 103.8* 104.8*  MCH 36.0* 35.7*  MCHC 34.7 34.1  RDW 14.5 14.6  PLT 219 204    BNPNo results for input(s): BNP, PROBNP in the last 168 hours.   DDimer No results for input(s): DDIMER in the last 168 hours.   Radiology    ECHOCARDIOGRAM COMPLETE  Result Date: 10/15/2020    ECHOCARDIOGRAM REPORT   Patient Name:   Corey Salas Date of Exam: 10/15/2020 Medical Rec #:  [id number]      Height:       71.0 in Accession #:    [id number]     Weight:       175.0 lb Date of Birth:  1961-01-27      BSA:          1.992 m Patient Age:    60 years       BP:           139/92 mmHg Patient Gender: M              HR:           66 bpm. Exam Location:  Inpatient Procedure: 2D Echo, Cardiac Doppler and Color  Doppler Indications:    CAD of Native Vessel I25.10  History:        Patient has no prior history of Echocardiogram examinations. CAD                 and Previous Myocardial Infarction, Stroke,                 Signs/Symptoms:Dyspnea and Chest Pain; Risk Factors:Hypertension                 and Current Smoker.  Sonographer:    Elmarie Shiley Dance Referring Phys: [id number] HQIONGE Z ATKINS IMPRESSIONS  1. Left ventricular ejection fraction, by estimation, is 60 to 65%. The left ventricle has normal function. The left ventricle has no regional wall motion abnormalities. There is mild left ventricular hypertrophy. Left ventricular diastolic parameters are indeterminate.  2. Right ventricular systolic function is normal. The right ventricular size is normal. Tricuspid regurgitation signal is inadequate for assessing PA pressure.  3. The mitral valve is normal in structure. Trivial mitral valve regurgitation. No evidence of mitral stenosis.  4. The aortic valve is tricuspid. Aortic valve regurgitation is not visualized. No aortic stenosis is present.  5. The inferior vena cava is normal in size with greater than 50% respiratory variability, suggesting right atrial pressure of 3 mmHg. FINDINGS  Left Ventricle: Left ventricular ejection fraction, by estimation, is 60 to 65%. The left ventricle has normal function. The left ventricle has no regional wall motion abnormalities. The left ventricular internal cavity size was normal in size. There is  mild left ventricular hypertrophy. Left ventricular diastolic parameters are indeterminate. Right Ventricle: The right ventricular size is normal. No increase in right ventricular wall thickness. Right ventricular systolic function is normal. Tricuspid regurgitation signal is inadequate for assessing PA pressure. Left Atrium: Left atrial size was normal in size. Right Atrium: Right atrial size was normal in size. Pericardium: Trivial pericardial effusion is present. Presence of pericardial  fat pad. Mitral Valve: The mitral valve  is normal in structure. Trivial mitral valve regurgitation. No evidence of mitral valve stenosis. Tricuspid Valve: The tricuspid valve is normal in structure. Tricuspid valve regurgitation is not demonstrated. Aortic Valve: The aortic valve is tricuspid. Aortic valve regurgitation is not visualized. No aortic stenosis is present. Pulmonic Valve: The pulmonic valve was not well visualized. Pulmonic valve regurgitation is not visualized. Aorta: The aortic root and ascending aorta are structurally normal, with no evidence of dilitation. Venous: The inferior vena cava is normal in size with greater than 50% respiratory variability, suggesting right atrial pressure of 3 mmHg. IAS/Shunts: The interatrial septum was not well visualized.  LEFT VENTRICLE PLAX 2D LVIDd:         3.90 cm  Diastology LVIDs:         2.60 cm  LV e' medial:    5.00 cm/s LV PW:         1.20 cm  LV E/e' medial:  19.6 LV IVS:        1.00 cm  LV e' lateral:   7.51 cm/s LVOT diam:     2.20 cm  LV E/e' lateral: 13.1 LV SV:         74 LV SV Index:   37 LVOT Area:     3.80 cm  RIGHT VENTRICLE             IVC RV Basal diam:  2.40 cm     IVC diam: 1.70 cm RV S prime:     10.40 cm/s TAPSE (M-mode): 1.3 cm LEFT ATRIUM           Index       RIGHT ATRIUM           Index LA diam:      3.70 cm 1.86 cm/m  RA Area:     12.40 cm LA Vol (A2C): 49.9 ml 25.05 ml/m RA Volume:   24.50 ml  12.30 ml/m LA Vol (A4C): 20.8 ml 10.44 ml/m  AORTIC VALVE LVOT Vmax:   98.20 cm/s LVOT Vmean:  61.550 cm/s LVOT VTI:    0.194 m  AORTA Ao Root diam: 3.60 cm Ao Asc diam:  3.50 cm MITRAL VALVE MV Area (PHT): 3.92 cm    SHUNTS MV Decel Time: 194 msec    Systemic VTI:  0.19 m MV E velocity: 98.20 cm/s  Systemic Diam: 2.20 cm MV A velocity: 73.75 cm/s MV E/A ratio:  1.33 Corey Lesches MD Electronically signed by Corey Lesches MD Signature Date/Time: 10/15/2020/6:54:47 PM    Final     Cardiac Studies   2D echocardiogram  (10/15/2020)  IMPRESSIONS     1. Left ventricular ejection fraction, by estimation, is 60 to 65%. The  left ventricle has normal function. The left ventricle has no regional  wall motion abnormalities. There is mild left ventricular hypertrophy.  Left ventricular diastolic parameters  are indeterminate.   2. Right ventricular systolic function is normal. The right ventricular  size is normal. Tricuspid regurgitation signal is inadequate for assessing  PA pressure.   3. The mitral valve is normal in structure. Trivial mitral valve  regurgitation. No evidence of mitral stenosis.   4. The aortic valve is tricuspid. Aortic valve regurgitation is not  visualized. No aortic stenosis is present.   5. The inferior vena cava is normal in size with greater than 50%  respiratory variability, suggesting right atrial pressure of 3 mmHg.   Cardiac catheterization (10/05/2020)  Conclusion    Previously placed Mid RCA stent (unknown  type) is widely patent. Prox RCA-1 lesion is 25% stenosed. Prox RCA-2 lesion is 70% stenosed. RPDA lesion is 70% stenosed. Ost LM to Mid LM lesion is 80% stenosed. 2nd Mrg lesion is 50% stenosed. Prox LAD lesion is 30% stenosed.   1. Severe diffuse 80% left main stenosis 2. Patent proximal LAD stent with mild nonobstructive LAD stenosis seen on limited angiography 3. Patent left circumflex and ramus intermedius seen on limited coronary imaging 4. Patent RCA with 60-70% proximal stenosis between 2 stents and 70% ostial PDA stenosis   Recommend: Cardiac surgical consultation for CABG Will need to determine plan for brilinta washout in this patient who is less than 2 months out from his inferior MI Continue anti-anginal medical therapy   Diagnostic Dominance: Right    Intervention    Patient Profile     NISSAN FRAZZINI is a 60 y.o. 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   Assessment & Plan    1: CAD-history of multiple stents in the past as well as severe left main disease status post coronary angiography 10/05/2020 by Dr. Excell Seltzer revealing a 70% stenosis in the mid RCA between 2 previously placed hence and severe left main disease scheduled for CABG tomorrow with Dr. Renaldo Fiddler.  He was admitted after stopping Brilinta last Friday for cangrelor bridge.  2: Essential hypertension-blood pressure adequately controlled on metoprolol  3: Hyperlipidemia-statin intolerant.  Will probably need to be referred to the lipid clinic for further evaluation  4: Tobacco abuse-on NicoDerm patch  5: Recent stroke-status post TCAR stent placement 09/13/2020.    For questions or updates, please contact [facility] Please consult [email or web address] for contact info under        Signed, Corey Batty, MD  10/16/2020, 10:04 AM

## 2020-10-16 NOTE — Hospital Course (Addendum)
HPI: Pleasant 60 year old man presents for surgical consultation of multivessel coronary artery disease.  States he began feeling fatigue this past December.  He was on vacation in [place] in April when he had a STEMI presented urgently to the local hospital.  He underwent PCI of the RCA at that point.  He was advised to follow-up which he did at [facility] this past month.  He underwent repeat cath demonstrating severe multivessel disease including left main disease.  His symptoms have been stable with fatigue and dyspnea on exertion.  He denies any orthopnea or PND.  Patient's recent history is also notable for a stroke with mild right body residual symptoms of weakness.  He is status post left sided TCAR. Per Dr. Vickey Sages, patient's situation is complicated by recent PCI with drug-eluting stent to the RCA.  He also has undergone recent TCAR.  Nevertheless, 80% left main is associated with high mortality treated medically.  Dr. Vickey Sages suggested CABG operation as soon as possible. Pre operative carotid duplex US showed no significant right internal carotid artery stenosis and a 60-79% left internal carotid artery stenosis. The patient underwent a CABG x ** on 10/17/2020.   Hospital Course: Patient was transferred in stable condition from the OR to [facility] ICU. Patient was extubated without difficulty late the evening of surgery. Patient was weaned off Epinephrine  and Midodrine drips. Theone Murdoch, a line, chest tubes, and foley were all removed early in his post operative course. He was started on Lopressor. He was volume overloaded and diuresed accordingly. He was started on Isordil 10 mg tid for radial artery graft. He was started on Plavix and ec asa was decreased to 81 mg daily. On 06/17, his BP was more labile so both Isordil and Lopressor were held. He was felt surgically stable for transfer from the ICU to 4E for further convalescence on 10/19/2020.

## 2020-10-17 ENCOUNTER — Inpatient Hospital Stay (HOSPITAL_COMMUNITY): Payer: Managed Care, Other (non HMO)

## 2020-10-17 ENCOUNTER — Inpatient Hospital Stay (HOSPITAL_COMMUNITY): Payer: Managed Care, Other (non HMO) | Admitting: Anesthesiology

## 2020-10-17 ENCOUNTER — Inpatient Hospital Stay (HOSPITAL_COMMUNITY)
Admission: AD | Disposition: A | Discharge status: Home / Self Care | Payer: Self-pay | Source: Home / Self Care | Attending: Cardiothoracic Surgery

## 2020-10-17 DIAGNOSIS — Z951 Presence of aortocoronary bypass graft: Secondary | ICD-10-CM

## 2020-10-17 DIAGNOSIS — I2510 Atherosclerotic heart disease of native coronary artery without angina pectoris: Secondary | ICD-10-CM

## 2020-10-17 HISTORY — PX: TEE WITHOUT CARDIOVERSION: SHX5443

## 2020-10-17 HISTORY — PX: CORONARY ARTERY BYPASS GRAFT: SHX141

## 2020-10-17 HISTORY — PX: VEIN HARVEST: SHX6363

## 2020-10-17 HISTORY — PX: RADIAL ARTERY HARVEST: SHX5067

## 2020-10-17 LAB — PREPARE PLATELET PHERESIS: Unit division: 0

## 2020-10-17 LAB — POCT I-STAT, CHEM 8
BUN: 13 mg/dL (ref 6–20)
BUN: 12 mg/dL (ref 6–20)
BUN: 11 mg/dL (ref 6–20)
BUN: 11 mg/dL (ref 6–20)
BUN: 12 mg/dL (ref 6–20)
BUN: 11 mg/dL (ref 6–20)
Calcium, Ion: 1.25 mmol/L (ref 1.15–1.40)
Calcium, Ion: 1.23 mmol/L (ref 1.15–1.40)
Calcium, Ion: 0.97 mmol/L — ABNORMAL LOW (ref 1.15–1.40)
Calcium, Ion: 1.04 mmol/L — ABNORMAL LOW (ref 1.15–1.40)
Calcium, Ion: 1 mmol/L — ABNORMAL LOW (ref 1.15–1.40)
Calcium, Ion: 1.03 mmol/L — ABNORMAL LOW (ref 1.15–1.40)
Chloride: 106 mmol/L (ref 98–111)
Chloride: 107 mmol/L (ref 98–111)
Chloride: 103 mmol/L (ref 98–111)
Chloride: 109 mmol/L (ref 98–111)
Chloride: 107 mmol/L (ref 98–111)
Chloride: 106 mmol/L (ref 98–111)
Creatinine, Ser: 0.8 mg/dL (ref 0.61–1.24)
Creatinine, Ser: 0.9 mg/dL (ref 0.61–1.24)
Creatinine, Ser: 0.7 mg/dL (ref 0.61–1.24)
Creatinine, Ser: 0.8 mg/dL (ref 0.61–1.24)
Creatinine, Ser: 0.7 mg/dL (ref 0.61–1.24)
Creatinine, Ser: 0.8 mg/dL (ref 0.61–1.24)
Glucose, Bld: 110 mg/dL — ABNORMAL HIGH (ref 70–99)
Glucose, Bld: 106 mg/dL — ABNORMAL HIGH (ref 70–99)
Glucose, Bld: 94 mg/dL (ref 70–99)
Glucose, Bld: 124 mg/dL — ABNORMAL HIGH (ref 70–99)
Glucose, Bld: 135 mg/dL — ABNORMAL HIGH (ref 70–99)
Glucose, Bld: 216 mg/dL — ABNORMAL HIGH (ref 70–99)
HCT: 45 % (ref 39.0–52.0)
HCT: 43 % (ref 39.0–52.0)
HCT: 32 % — ABNORMAL LOW (ref 39.0–52.0)
HCT: 31 % — ABNORMAL LOW (ref 39.0–52.0)
HCT: 30 % — ABNORMAL LOW (ref 39.0–52.0)
HCT: 28 % — ABNORMAL LOW (ref 39.0–52.0)
Hemoglobin: 15.3 g/dL (ref 13.0–17.0)
Hemoglobin: 14.6 g/dL (ref 13.0–17.0)
Hemoglobin: 10.9 g/dL — ABNORMAL LOW (ref 13.0–17.0)
Hemoglobin: 10.5 g/dL — ABNORMAL LOW (ref 13.0–17.0)
Hemoglobin: 10.2 g/dL — ABNORMAL LOW (ref 13.0–17.0)
Hemoglobin: 9.5 g/dL — ABNORMAL LOW (ref 13.0–17.0)
Potassium: 4.1 mmol/L (ref 3.5–5.1)
Potassium: 4.5 mmol/L (ref 3.5–5.1)
Potassium: 4.5 mmol/L (ref 3.5–5.1)
Potassium: 6.1 mmol/L — ABNORMAL HIGH (ref 3.5–5.1)
Potassium: 5.7 mmol/L — ABNORMAL HIGH (ref 3.5–5.1)
Potassium: 4.4 mmol/L (ref 3.5–5.1)
Sodium: 141 mmol/L (ref 135–145)
Sodium: 139 mmol/L (ref 135–145)
Sodium: 141 mmol/L (ref 135–145)
Sodium: 138 mmol/L (ref 135–145)
Sodium: 139 mmol/L (ref 135–145)
Sodium: 140 mmol/L (ref 135–145)
TCO2: 26 mmol/L (ref 22–32)
TCO2: 24 mmol/L (ref 22–32)
TCO2: 28 mmol/L (ref 22–32)
TCO2: 26 mmol/L (ref 22–32)
TCO2: 27 mmol/L (ref 22–32)
TCO2: 26 mmol/L (ref 22–32)

## 2020-10-17 LAB — POCT I-STAT 7, (LYTES, BLD GAS, ICA,H+H)
Acid-Base Excess: 1 mmol/L (ref 0.0–2.0)
Acid-Base Excess: 3 mmol/L — ABNORMAL HIGH (ref 0.0–2.0)
Acid-base deficit: 3 mmol/L — ABNORMAL HIGH (ref 0.0–2.0)
Acid-base deficit: 1 mmol/L (ref 0.0–2.0)
Acid-base deficit: 2 mmol/L (ref 0.0–2.0)
Acid-base deficit: 3 mmol/L — ABNORMAL HIGH (ref 0.0–2.0)
Acid-base deficit: 2 mmol/L (ref 0.0–2.0)
Acid-base deficit: 3 mmol/L — ABNORMAL HIGH (ref 0.0–2.0)
Acid-base deficit: 3 mmol/L — ABNORMAL HIGH (ref 0.0–2.0)
Bicarbonate: 23.1 mmol/L (ref 20.0–28.0)
Bicarbonate: 25.3 mmol/L (ref 20.0–28.0)
Bicarbonate: 26.3 mmol/L (ref 20.0–28.0)
Bicarbonate: 26.6 mmol/L (ref 20.0–28.0)
Bicarbonate: 24.9 mmol/L (ref 20.0–28.0)
Bicarbonate: 23.7 mmol/L (ref 20.0–28.0)
Bicarbonate: 21.4 mmol/L (ref 20.0–28.0)
Bicarbonate: 22.7 mmol/L (ref 20.0–28.0)
Bicarbonate: 21.9 mmol/L (ref 20.0–28.0)
Calcium, Ion: 1.21 mmol/L (ref 1.15–1.40)
Calcium, Ion: 0.94 mmol/L — ABNORMAL LOW (ref 1.15–1.40)
Calcium, Ion: 0.96 mmol/L — ABNORMAL LOW (ref 1.15–1.40)
Calcium, Ion: 1.01 mmol/L — ABNORMAL LOW (ref 1.15–1.40)
Calcium, Ion: 1.02 mmol/L — ABNORMAL LOW (ref 1.15–1.40)
Calcium, Ion: 1.05 mmol/L — ABNORMAL LOW (ref 1.15–1.40)
Calcium, Ion: 1 mmol/L — ABNORMAL LOW (ref 1.15–1.40)
Calcium, Ion: 1.03 mmol/L — ABNORMAL LOW (ref 1.15–1.40)
Calcium, Ion: 1.06 mmol/L — ABNORMAL LOW (ref 1.15–1.40)
HCT: 44 % (ref 39.0–52.0)
HCT: 31 % — ABNORMAL LOW (ref 39.0–52.0)
HCT: 33 % — ABNORMAL LOW (ref 39.0–52.0)
HCT: 32 % — ABNORMAL LOW (ref 39.0–52.0)
HCT: 30 % — ABNORMAL LOW (ref 39.0–52.0)
HCT: 40 % (ref 39.0–52.0)
HCT: 36 % — ABNORMAL LOW (ref 39.0–52.0)
HCT: 36 % — ABNORMAL LOW (ref 39.0–52.0)
HCT: 36 % — ABNORMAL LOW (ref 39.0–52.0)
Hemoglobin: 15 g/dL (ref 13.0–17.0)
Hemoglobin: 10.5 g/dL — ABNORMAL LOW (ref 13.0–17.0)
Hemoglobin: 11.2 g/dL — ABNORMAL LOW (ref 13.0–17.0)
Hemoglobin: 10.9 g/dL — ABNORMAL LOW (ref 13.0–17.0)
Hemoglobin: 10.2 g/dL — ABNORMAL LOW (ref 13.0–17.0)
Hemoglobin: 13.6 g/dL (ref 13.0–17.0)
Hemoglobin: 12.2 g/dL — ABNORMAL LOW (ref 13.0–17.0)
Hemoglobin: 12.2 g/dL — ABNORMAL LOW (ref 13.0–17.0)
Hemoglobin: 12.2 g/dL — ABNORMAL LOW (ref 13.0–17.0)
O2 Saturation: 100 %
O2 Saturation: 72 %
O2 Saturation: 100 %
O2 Saturation: 100 %
O2 Saturation: 99 %
O2 Saturation: 97 %
O2 Saturation: 95 %
O2 Saturation: 97 %
O2 Saturation: 90 %
Patient temperature: 36.4
Patient temperature: 36.7
Patient temperature: 36.7
Patient temperature: 37.1
Potassium: 4 mmol/L (ref 3.5–5.1)
Potassium: 4.9 mmol/L (ref 3.5–5.1)
Potassium: 4.4 mmol/L (ref 3.5–5.1)
Potassium: 5.9 mmol/L — ABNORMAL HIGH (ref 3.5–5.1)
Potassium: 4.5 mmol/L (ref 3.5–5.1)
Potassium: 4 mmol/L (ref 3.5–5.1)
Potassium: 4.3 mmol/L (ref 3.5–5.1)
Potassium: 4.4 mmol/L (ref 3.5–5.1)
Potassium: 4.2 mmol/L (ref 3.5–5.1)
Sodium: 141 mmol/L (ref 135–145)
Sodium: 143 mmol/L (ref 135–145)
Sodium: 142 mmol/L (ref 135–145)
Sodium: 140 mmol/L (ref 135–145)
Sodium: 141 mmol/L (ref 135–145)
Sodium: 143 mmol/L (ref 135–145)
Sodium: 141 mmol/L (ref 135–145)
Sodium: 141 mmol/L (ref 135–145)
Sodium: 142 mmol/L (ref 135–145)
TCO2: 24 mmol/L (ref 22–32)
TCO2: 27 mmol/L (ref 22–32)
TCO2: 28 mmol/L (ref 22–32)
TCO2: 28 mmol/L (ref 22–32)
TCO2: 26 mmol/L (ref 22–32)
TCO2: 25 mmol/L (ref 22–32)
TCO2: 23 mmol/L (ref 22–32)
TCO2: 24 mmol/L (ref 22–32)
TCO2: 23 mmol/L (ref 22–32)
pCO2 arterial: 43.2 mmHg (ref 32.0–48.0)
pCO2 arterial: 46.2 mmHg (ref 32.0–48.0)
pCO2 arterial: 42.4 mmHg (ref 32.0–48.0)
pCO2 arterial: 34.6 mmHg (ref 32.0–48.0)
pCO2 arterial: 49.6 mmHg — ABNORMAL HIGH (ref 32.0–48.0)
pCO2 arterial: 46.2 mmHg (ref 32.0–48.0)
pCO2 arterial: 36.7 mmHg (ref 32.0–48.0)
pCO2 arterial: 39.1 mmHg (ref 32.0–48.0)
pCO2 arterial: 39.8 mmHg (ref 32.0–48.0)
pH, Arterial: 7.336 — ABNORMAL LOW (ref 7.350–7.450)
pH, Arterial: 7.347 — ABNORMAL LOW (ref 7.350–7.450)
pH, Arterial: 7.401 (ref 7.350–7.450)
pH, Arterial: 7.494 — ABNORMAL HIGH (ref 7.350–7.450)
pH, Arterial: 7.31 — ABNORMAL LOW (ref 7.350–7.450)
pH, Arterial: 7.316 — ABNORMAL LOW (ref 7.350–7.450)
pH, Arterial: 7.37 (ref 7.350–7.450)
pH, Arterial: 7.373 (ref 7.350–7.450)
pH, Arterial: 7.349 — ABNORMAL LOW (ref 7.350–7.450)
pO2, Arterial: 265 mmHg — ABNORMAL HIGH (ref 83.0–108.0)
pO2, Arterial: 40 mmHg — CL (ref 83.0–108.0)
pO2, Arterial: 329 mmHg — ABNORMAL HIGH (ref 83.0–108.0)
pO2, Arterial: 326 mmHg — ABNORMAL HIGH (ref 83.0–108.0)
pO2, Arterial: 181 mmHg — ABNORMAL HIGH (ref 83.0–108.0)
pO2, Arterial: 101 mmHg (ref 83.0–108.0)
pO2, Arterial: 74 mmHg — ABNORMAL LOW (ref 83.0–108.0)
pO2, Arterial: 88 mmHg (ref 83.0–108.0)
pO2, Arterial: 62 mmHg — ABNORMAL LOW (ref 83.0–108.0)

## 2020-10-17 LAB — BASIC METABOLIC PANEL
Anion gap: 12 (ref 5–15)
Anion gap: 6 (ref 5–15)
BUN: 14 mg/dL (ref 6–20)
BUN: 9 mg/dL (ref 6–20)
CO2: 21 mmol/L — ABNORMAL LOW (ref 22–32)
CO2: 23 mmol/L (ref 22–32)
Calcium: 8.9 mg/dL (ref 8.9–10.3)
Calcium: 7.1 mg/dL — ABNORMAL LOW (ref 8.9–10.3)
Chloride: 106 mmol/L (ref 98–111)
Chloride: 110 mmol/L (ref 98–111)
Creatinine, Ser: 1.15 mg/dL (ref 0.61–1.24)
Creatinine, Ser: 0.95 mg/dL (ref 0.61–1.24)
GFR, Estimated: >60 mL/min (ref >60)
GFR, Estimated: >60 mL/min (ref >60)
Glucose, Bld: 113 mg/dL — ABNORMAL HIGH (ref 70–99)
Glucose, Bld: 148 mg/dL — ABNORMAL HIGH (ref 70–99)
Potassium: 4.2 mmol/L (ref 3.5–5.1)
Potassium: 4.2 mmol/L (ref 3.5–5.1)
Sodium: 139 mmol/L (ref 135–145)
Sodium: 139 mmol/L (ref 135–145)

## 2020-10-17 LAB — PREPARE CRYOPRECIPITATE
Unit division: 0
Unit division: 0

## 2020-10-17 LAB — URINALYSIS, ROUTINE W REFLEX MICROSCOPIC
Bilirubin Urine: NEGATIVE
Glucose, UA: NEGATIVE mg/dL
Hgb urine dipstick: NEGATIVE
Ketones, ur: NEGATIVE mg/dL
Leukocytes,Ua: NEGATIVE
Nitrite: NEGATIVE
Protein, ur: NEGATIVE mg/dL
Specific Gravity, Urine: 1.01 (ref 1.005–1.030)
pH: 7 (ref 5.0–8.0)

## 2020-10-17 LAB — BPAM PLATELET PHERESIS
Blood Product Expiration Date: 202206152359
ISSUE DATE / TIME: 202206151518
Unit Type and Rh: 5100

## 2020-10-17 LAB — CBC
HCT: 46 % (ref 39.0–52.0)
HCT: 38.7 % — ABNORMAL LOW (ref 39.0–52.0)
HCT: 35.6 % — ABNORMAL LOW (ref 39.0–52.0)
Hemoglobin: 15.5 g/dL (ref 13.0–17.0)
Hemoglobin: 12.8 g/dL — ABNORMAL LOW (ref 13.0–17.0)
Hemoglobin: 11.7 g/dL — ABNORMAL LOW (ref 13.0–17.0)
MCH: 35.6 pg — ABNORMAL HIGH (ref 26.0–34.0)
MCH: 35.9 pg — ABNORMAL HIGH (ref 26.0–34.0)
MCH: 35.8 pg — ABNORMAL HIGH (ref 26.0–34.0)
MCHC: 33.7 g/dL (ref 30.0–36.0)
MCHC: 33.1 g/dL (ref 30.0–36.0)
MCHC: 32.9 g/dL (ref 30.0–36.0)
MCV: 105.5 fL — ABNORMAL HIGH (ref 80.0–100.0)
MCV: 108.4 fL — ABNORMAL HIGH (ref 80.0–100.0)
MCV: 108.9 fL — ABNORMAL HIGH (ref 80.0–100.0)
Platelets: 184 10*3/uL (ref 150–400)
Platelets: 108 10*3/uL — ABNORMAL LOW (ref 150–400)
Platelets: 102 10*3/uL — ABNORMAL LOW (ref 150–400)
RBC: 4.36 MIL/uL (ref 4.22–5.81)
RBC: 3.57 MIL/uL — ABNORMAL LOW (ref 4.22–5.81)
RBC: 3.27 MIL/uL — ABNORMAL LOW (ref 4.22–5.81)
RDW: 14.7 % (ref 11.5–15.5)
RDW: 14.6 % (ref 11.5–15.5)
RDW: 14.7 % (ref 11.5–15.5)
WBC: 7.6 10*3/uL (ref 4.0–10.5)
WBC: 15.9 10*3/uL — ABNORMAL HIGH (ref 4.0–10.5)
WBC: 12.8 10*3/uL — ABNORMAL HIGH (ref 4.0–10.5)
nRBC: 0 % (ref 0.0–0.2)
nRBC: 0 % (ref 0.0–0.2)
nRBC: 0 % (ref 0.0–0.2)

## 2020-10-17 LAB — BPAM CRYOPRECIPITATE
Blood Product Expiration Date: 202206152119
Blood Product Expiration Date: 202206152119
ISSUE DATE / TIME: 202206151546
ISSUE DATE / TIME: 202206151546
Unit Type and Rh: 6200
Unit Type and Rh: 6200

## 2020-10-17 LAB — BLOOD GAS, ARTERIAL
Acid-base deficit: 1.7 mmol/L (ref 0.0–2.0)
Bicarbonate: 22.1 mmol/L (ref 20.0–28.0)
Drawn by: 270271
FIO2: 21
O2 Saturation: 93.6 %
Patient temperature: 37
pCO2 arterial: 34.4 mmHg (ref 32.0–48.0)
pH, Arterial: 7.423 (ref 7.350–7.450)
pO2, Arterial: 66.8 mmHg — ABNORMAL LOW (ref 83.0–108.0)

## 2020-10-17 LAB — ECHO INTRAOPERATIVE TEE
Height: 71 in
Weight: 2793.67 oz

## 2020-10-17 LAB — PLATELET COUNT: Platelets: 125 10*3/uL — ABNORMAL LOW (ref 150–400)

## 2020-10-17 LAB — GLUCOSE, CAPILLARY
Glucose-Capillary: 130 mg/dL — ABNORMAL HIGH (ref 70–99)
Glucose-Capillary: 123 mg/dL — ABNORMAL HIGH (ref 70–99)
Glucose-Capillary: 117 mg/dL — ABNORMAL HIGH (ref 70–99)
Glucose-Capillary: 118 mg/dL — ABNORMAL HIGH (ref 70–99)
Glucose-Capillary: 171 mg/dL — ABNORMAL HIGH (ref 70–99)

## 2020-10-17 LAB — COOXEMETRY PANEL
Carboxyhemoglobin: 1.2 % (ref 0.5–1.5)
Methemoglobin: 0.9 % (ref 0.0–1.5)
O2 Saturation: 64.7 %
Total hemoglobin: 11.8 g/dL — ABNORMAL LOW (ref 12.0–16.0)

## 2020-10-17 LAB — MAGNESIUM: Magnesium: 2.9 mg/dL — ABNORMAL HIGH (ref 1.7–2.4)

## 2020-10-17 LAB — HEMOGLOBIN AND HEMATOCRIT, BLOOD
HCT: 29.2 % — ABNORMAL LOW (ref 39.0–52.0)
Hemoglobin: 9.8 g/dL — ABNORMAL LOW (ref 13.0–17.0)

## 2020-10-17 LAB — HEMOGLOBIN A1C
Hgb A1c MFr Bld: 5.1 % (ref 4.8–5.6)
Mean Plasma Glucose: 100 mg/dL

## 2020-10-17 LAB — APTT: aPTT: 29 seconds (ref 24–36)

## 2020-10-17 LAB — PROTIME-INR
INR: 1.2 (ref 0.8–1.2)
Prothrombin Time: 15.1 seconds (ref 11.4–15.2)

## 2020-10-17 SURGERY — CORONARY ARTERY BYPASS GRAFTING (CABG)
Anesthesia: General | Site: Chest | Laterality: Right

## 2020-10-17 MED ORDER — FENTANYL CITRATE (PF) 250 MCG/5ML IJ SOLN
INTRAMUSCULAR | Status: AC
  Filled 2020-10-17: qty 20

## 2020-10-17 MED ORDER — KETOROLAC TROMETHAMINE 15 MG/ML IJ SOLN
7.5000 mg | Freq: Four times a day (QID) | INTRAMUSCULAR | Status: AC
  Administered 2020-10-18 (×3): 7.5 mg via INTRAVENOUS
  Filled 2020-10-17 (×4): qty 1

## 2020-10-17 MED ORDER — PLATELET POOR PLASMA OPTIME
Status: DC | PRN
  Administered 2020-10-17: 10 mL

## 2020-10-17 MED ORDER — SODIUM CHLORIDE 0.9% FLUSH: 3.0000 mL | Freq: Two times a day (BID) | INTRAVENOUS | Status: DC

## 2020-10-17 MED ORDER — PHENYLEPHRINE 40 MCG/ML (10ML) SYRINGE FOR IV PUSH (FOR BLOOD PRESSURE SUPPORT)
PREFILLED_SYRINGE | INTRAVENOUS | Status: AC
  Filled 2020-10-17: qty 10

## 2020-10-17 MED ORDER — ACETAMINOPHEN 500 MG PO TABS: 1000.0000 mg | ORAL_TABLET | Freq: Four times a day (QID) | ORAL | Status: DC

## 2020-10-17 MED ORDER — THIAMINE HCL 100 MG PO TABS
100.0000 mg | ORAL_TABLET | Freq: Every day | ORAL | Status: DC
  Administered 2020-10-18 – 2020-10-22 (×5): 100 mg via ORAL
  Filled 2020-10-17 (×5): qty 1

## 2020-10-17 MED ORDER — VANCOMYCIN HCL 1000 MG IV SOLR
INTRAVENOUS | Status: DC | PRN
  Administered 2020-10-17: 3 g via TOPICAL

## 2020-10-17 MED ORDER — INSULIN REGULAR(HUMAN) IN NACL 100-0.9 UT/100ML-% IV SOLN: INTRAVENOUS | Status: DC

## 2020-10-17 MED ORDER — FOLIC ACID 1 MG PO TABS
1.0000 mg | ORAL_TABLET | Freq: Every day | ORAL | Status: DC
  Administered 2020-10-18 – 2020-10-22 (×5): 1 mg via ORAL
  Filled 2020-10-17 (×5): qty 1

## 2020-10-17 MED ORDER — PHENYLEPHRINE HCL-NACL 20-0.9 MG/250ML-% IV SOLN
0.0000 ug/min | INTRAVENOUS | Status: DC
  Administered 2020-10-19: 30 ug/min via INTRAVENOUS
  Filled 2020-10-17 (×2): qty 250

## 2020-10-17 MED ORDER — LACTATED RINGERS IV SOLN: INTRAVENOUS | Status: DC | PRN

## 2020-10-17 MED ORDER — VANCOMYCIN HCL IN DEXTROSE 1-5 GM/200ML-% IV SOLN
1000.0000 mg | Freq: Once | INTRAVENOUS | Status: AC
  Administered 2020-10-17: 1000 mg via INTRAVENOUS
  Filled 2020-10-17: qty 200

## 2020-10-17 MED ORDER — SODIUM CHLORIDE 0.9% FLUSH: 3.0000 mL | INTRAVENOUS | Status: DC | PRN

## 2020-10-17 MED ORDER — ASPIRIN EC 325 MG PO TBEC: 325.0000 mg | DELAYED_RELEASE_TABLET | Freq: Every day | ORAL | Status: DC

## 2020-10-17 MED ORDER — DOCUSATE SODIUM 100 MG PO CAPS
200.0000 mg | ORAL_CAPSULE | Freq: Every day | ORAL | Status: DC
  Administered 2020-10-18 – 2020-10-20 (×3): 200 mg via ORAL
  Filled 2020-10-17 (×4): qty 2

## 2020-10-17 MED ORDER — STERILE WATER FOR INJECTION IJ SOLN
INTRAMUSCULAR | Status: DC | PRN
  Administered 2020-10-17: 10 mL via INTRAVASCULAR

## 2020-10-17 MED ORDER — LACTATED RINGERS IV SOLN: 500.0000 mL | Freq: Once | INTRAVENOUS | Status: DC | PRN

## 2020-10-17 MED ORDER — THROMBIN 5000 UNITS EX SOLR
INTRAVENOUS | Status: DC | PRN
  Administered 2020-10-17: 1 mL

## 2020-10-17 MED ORDER — HEPARIN SODIUM (PORCINE) 1000 UNIT/ML IJ SOLN
INTRAMUSCULAR | Status: AC
  Filled 2020-10-17: qty 1

## 2020-10-17 MED ORDER — MIDAZOLAM HCL 2 MG/2ML IJ SOLN: 2.0000 mg | INTRAMUSCULAR | Status: DC | PRN

## 2020-10-17 MED ORDER — HEMOSTATIC AGENTS (NO CHARGE) OPTIME
TOPICAL | Status: DC | PRN
  Administered 2020-10-17 (×3): 1 via TOPICAL

## 2020-10-17 MED ORDER — CHLORHEXIDINE GLUCONATE CLOTH 2 % EX PADS
6.0000 | MEDICATED_PAD | Freq: Every day | CUTANEOUS | Status: DC
  Administered 2020-10-17 – 2020-10-21 (×5): 6 via TOPICAL

## 2020-10-17 MED ORDER — SODIUM CHLORIDE 0.9 % IV SOLN: INTRAVENOUS | Status: DC | PRN

## 2020-10-17 MED ORDER — SODIUM CHLORIDE 0.45 % IV SOLN: INTRAVENOUS | Status: DC | PRN

## 2020-10-17 MED ORDER — MORPHINE SULFATE (PF) 2 MG/ML IV SOLN
1.0000 mg | INTRAVENOUS | Status: DC | PRN
  Administered 2020-10-17 – 2020-10-19 (×8): 2 mg via INTRAVENOUS
  Administered 2020-10-19 (×2): 4 mg via INTRAVENOUS
  Administered 2020-10-19 – 2020-10-20 (×2): 2 mg via INTRAVENOUS
  Administered 2020-10-20: 1 mg via INTRAVENOUS
  Filled 2020-10-17 (×5): qty 1
  Filled 2020-10-17: qty 2
  Filled 2020-10-17 (×6): qty 1
  Filled 2020-10-17: qty 2

## 2020-10-17 MED ORDER — ALBUMIN HUMAN 5 % IV SOLN
250.0000 mL | INTRAVENOUS | Status: AC | PRN
  Administered 2020-10-17 (×4): 12.5 g via INTRAVENOUS
  Filled 2020-10-17 (×2): qty 250

## 2020-10-17 MED ORDER — SODIUM CHLORIDE 0.9% IV SOLUTION: Freq: Once | INTRAVENOUS | Status: DC

## 2020-10-17 MED ORDER — DEXTROSE 50 % IV SOLN: 0.0000 mL | INTRAVENOUS | Status: DC | PRN

## 2020-10-17 MED ORDER — ONDANSETRON HCL 4 MG/2ML IJ SOLN: 4.0000 mg | Freq: Four times a day (QID) | INTRAMUSCULAR | Status: DC | PRN

## 2020-10-17 MED ORDER — STERILE WATER FOR IRRIGATION IR SOLN
Status: DC | PRN
  Administered 2020-10-17: 2000 mL

## 2020-10-17 MED ORDER — NITROGLYCERIN IN D5W 200-5 MCG/ML-% IV SOLN: 0.0000 ug/min | INTRAVENOUS | Status: DC

## 2020-10-17 MED ORDER — FENTANYL CITRATE (PF) 250 MCG/5ML IJ SOLN
INTRAMUSCULAR | Status: DC | PRN
  Administered 2020-10-17: 150 ug via INTRAVENOUS
  Administered 2020-10-17: 250 ug via INTRAVENOUS
  Administered 2020-10-17: 350 ug via INTRAVENOUS
  Administered 2020-10-17: 250 ug via INTRAVENOUS

## 2020-10-17 MED ORDER — TRAMADOL HCL 50 MG PO TABS
50.0000 mg | ORAL_TABLET | ORAL | Status: DC | PRN
  Administered 2020-10-19 – 2020-10-21 (×10): 100 mg via ORAL
  Filled 2020-10-17 (×10): qty 2

## 2020-10-17 MED ORDER — LEVALBUTEROL HCL 0.63 MG/3ML IN NEBU
0.6300 mg | INHALATION_SOLUTION | Freq: Four times a day (QID) | RESPIRATORY_TRACT | Status: DC
  Administered 2020-10-17: 0.63 mg via RESPIRATORY_TRACT
  Filled 2020-10-17: qty 3

## 2020-10-17 MED ORDER — METOPROLOL TARTRATE 25 MG/10 ML ORAL SUSPENSION: 12.5000 mg | Freq: Two times a day (BID) | ORAL | Status: DC

## 2020-10-17 MED ORDER — SODIUM CHLORIDE 0.9% FLUSH: 10.0000 mL | INTRAVENOUS | Status: DC | PRN

## 2020-10-17 MED ORDER — COLCHICINE 0.3 MG HALF TABLET
0.3000 mg | ORAL_TABLET | Freq: Two times a day (BID) | ORAL | Status: DC
  Administered 2020-10-18 – 2020-10-22 (×9): 0.3 mg via ORAL
  Filled 2020-10-17 (×12): qty 1

## 2020-10-17 MED ORDER — MAGNESIUM SULFATE 4 GM/100ML IV SOLN
4.0000 g | Freq: Once | INTRAVENOUS | Status: AC
  Administered 2020-10-17: 4 g via INTRAVENOUS
  Filled 2020-10-17: qty 100

## 2020-10-17 MED ORDER — PLATELET RICH PLASMA OPTIME
Status: DC | PRN
  Administered 2020-10-17: 10 mL

## 2020-10-17 MED ORDER — ROCURONIUM BROMIDE 10 MG/ML (PF) SYRINGE
PREFILLED_SYRINGE | INTRAVENOUS | Status: DC | PRN
  Administered 2020-10-17: 100 mg via INTRAVENOUS
  Administered 2020-10-17 (×4): 50 mg via INTRAVENOUS

## 2020-10-17 MED ORDER — PROPOFOL 1000 MG/100ML IV EMUL
INTRAVENOUS | Status: AC
  Filled 2020-10-17: qty 100

## 2020-10-17 MED ORDER — PROTAMINE SULFATE 10 MG/ML IV SOLN
INTRAVENOUS | Status: DC | PRN
  Administered 2020-10-17 (×4): 50 mg via INTRAVENOUS
  Administered 2020-10-17: 20 mg via INTRAVENOUS
  Administered 2020-10-17: 10 mg via INTRAVENOUS
  Administered 2020-10-17: 50 mg via INTRAVENOUS

## 2020-10-17 MED ORDER — PANTOPRAZOLE SODIUM 40 MG PO TBEC
40.0000 mg | DELAYED_RELEASE_TABLET | Freq: Every day | ORAL | Status: DC
  Administered 2020-10-19 – 2020-10-22 (×4): 40 mg via ORAL
  Filled 2020-10-17 (×4): qty 1

## 2020-10-17 MED ORDER — POTASSIUM CHLORIDE 10 MEQ/50ML IV SOLN: 10.0000 meq | INTRAVENOUS | Status: AC

## 2020-10-17 MED ORDER — ACETAMINOPHEN 160 MG/5ML PO SOLN
650.0000 mg | Freq: Once | ORAL | Status: AC
Start: 2020-10-17 — End: 2020-10-17

## 2020-10-17 MED ORDER — METOPROLOL TARTRATE 5 MG/5ML IV SOLN
2.5000 mg | INTRAVENOUS | Status: DC | PRN
  Administered 2020-10-17: 5 mg via INTRAVENOUS
  Filled 2020-10-17: qty 5

## 2020-10-17 MED ORDER — SODIUM CHLORIDE 0.9 % IV SOLN: 250.0000 mL | INTRAVENOUS | Status: DC

## 2020-10-17 MED ORDER — PROTAMINE SULFATE 10 MG/ML IV SOLN
INTRAVENOUS | Status: AC
  Filled 2020-10-17: qty 5

## 2020-10-17 MED ORDER — METOPROLOL TARTRATE 12.5 MG HALF TABLET
12.5000 mg | ORAL_TABLET | Freq: Two times a day (BID) | ORAL | Status: DC
  Administered 2020-10-19 – 2020-10-22 (×5): 12.5 mg via ORAL
  Filled 2020-10-17 (×7): qty 1

## 2020-10-17 MED ORDER — ASPIRIN 81 MG PO CHEW: 324.0000 mg | CHEWABLE_TABLET | Freq: Every day | ORAL | Status: DC

## 2020-10-17 MED ORDER — ROCURONIUM BROMIDE 10 MG/ML (PF) SYRINGE
PREFILLED_SYRINGE | INTRAVENOUS | Status: AC
  Filled 2020-10-17: qty 20

## 2020-10-17 MED ORDER — PLASMA-LYTE A IV SOLN: INTRAVENOUS | Status: DC

## 2020-10-17 MED ORDER — CEFAZOLIN SODIUM-DEXTROSE 2-4 GM/100ML-% IV SOLN
2.0000 g | Freq: Three times a day (TID) | INTRAVENOUS | Status: AC
  Administered 2020-10-17 – 2020-10-19 (×6): 2 g via INTRAVENOUS
  Filled 2020-10-17 (×6): qty 100

## 2020-10-17 MED ORDER — BUPIVACAINE LIPOSOME 1.3 % IJ SUSP
INTRAMUSCULAR | Status: AC
  Filled 2020-10-17: qty 20

## 2020-10-17 MED ORDER — DEXMEDETOMIDINE HCL IN NACL 400 MCG/100ML IV SOLN
0.0000 ug/kg/h | INTRAVENOUS | Status: DC
  Filled 2020-10-17: qty 100

## 2020-10-17 MED ORDER — STERILE WATER FOR INJECTION IV SOLN
INTRAVENOUS | Status: DC | PRN
  Administered 2020-10-17: 30 mL

## 2020-10-17 MED ORDER — ADULT MULTIVITAMIN W/MINERALS CH
1.0000 | ORAL_TABLET | Freq: Every day | ORAL | Status: DC
  Administered 2020-10-18 – 2020-10-22 (×5): 1 via ORAL
  Filled 2020-10-17 (×5): qty 1

## 2020-10-17 MED ORDER — LACTATED RINGERS IV SOLN: INTRAVENOUS | Status: DC

## 2020-10-17 MED ORDER — MIDAZOLAM HCL (PF) 5 MG/ML IJ SOLN
INTRAMUSCULAR | Status: DC | PRN
  Administered 2020-10-17: 7 mg via INTRAVENOUS
  Administered 2020-10-17: 3 mg via INTRAVENOUS

## 2020-10-17 MED ORDER — PHENYLEPHRINE 40 MCG/ML (10ML) SYRINGE FOR IV PUSH (FOR BLOOD PRESSURE SUPPORT)
PREFILLED_SYRINGE | INTRAVENOUS | Status: DC | PRN
  Administered 2020-10-17: 40 ug via INTRAVENOUS
  Administered 2020-10-17 (×2): 80 ug via INTRAVENOUS

## 2020-10-17 MED ORDER — BUPIVACAINE LIPOSOME 1.3 % IJ SUSP
INTRAMUSCULAR | Status: DC | PRN
  Administered 2020-10-17: 50 mL

## 2020-10-17 MED ORDER — PROTAMINE SULFATE 10 MG/ML IV SOLN
INTRAVENOUS | Status: AC
  Filled 2020-10-17: qty 25

## 2020-10-17 MED ORDER — ACETAMINOPHEN 160 MG/5ML PO SOLN: 1000.0000 mg | Freq: Four times a day (QID) | ORAL | Status: DC

## 2020-10-17 MED ORDER — CLOPIDOGREL BISULFATE 75 MG PO TABS
75.0000 mg | ORAL_TABLET | Freq: Every day | ORAL | Status: DC
  Administered 2020-10-18 – 2020-10-22 (×5): 75 mg via ORAL
  Filled 2020-10-17 (×5): qty 1

## 2020-10-17 MED ORDER — HEPARIN SODIUM (PORCINE) 1000 UNIT/ML IJ SOLN
INTRAMUSCULAR | Status: DC | PRN
  Administered 2020-10-17: 28000 [IU] via INTRAVENOUS

## 2020-10-17 MED ORDER — STERILE WATER FOR INJECTION IJ SOLN
INTRAMUSCULAR | Status: AC
  Filled 2020-10-17: qty 10

## 2020-10-17 MED ORDER — BISACODYL 10 MG RE SUPP: 10.0000 mg | Freq: Every day | RECTAL | Status: DC

## 2020-10-17 MED ORDER — CHLORHEXIDINE GLUCONATE 0.12 % MT SOLN
15.0000 mL | OROMUCOSAL | Status: AC
  Administered 2020-10-17: 15 mL via OROMUCOSAL

## 2020-10-17 MED ORDER — EPINEPHRINE HCL 5 MG/250ML IV SOLN IN NS
0.5000 ug/min | INTRAVENOUS | Status: DC
Start: 2020-10-17 — End: 2020-10-19
  Administered 2020-10-17: 0.5 ug/min via INTRAVENOUS

## 2020-10-17 MED ORDER — SODIUM CHLORIDE 0.9% FLUSH
10.0000 mL | Freq: Two times a day (BID) | INTRAVENOUS | Status: DC
  Administered 2020-10-17: 10 mL

## 2020-10-17 MED ORDER — 0.9 % SODIUM CHLORIDE (POUR BTL) OPTIME
TOPICAL | Status: DC | PRN
  Administered 2020-10-17: 5000 mL

## 2020-10-17 MED ORDER — FAMOTIDINE IN NACL 20-0.9 MG/50ML-% IV SOLN
20.0000 mg | Freq: Two times a day (BID) | INTRAVENOUS | Status: AC
  Administered 2020-10-17: 20 mg via INTRAVENOUS
  Filled 2020-10-17 (×2): qty 50

## 2020-10-17 MED ORDER — BUPIVACAINE HCL (PF) 0.5 % IJ SOLN
INTRAMUSCULAR | Status: AC
  Filled 2020-10-17: qty 30

## 2020-10-17 MED ORDER — VANCOMYCIN HCL 1000 MG IV SOLR
INTRAVENOUS | Status: AC
  Filled 2020-10-17: qty 3000

## 2020-10-17 MED ORDER — PROPOFOL 10 MG/ML IV BOLUS
INTRAVENOUS | Status: AC
  Filled 2020-10-17: qty 20

## 2020-10-17 MED ORDER — LEVALBUTEROL HCL 0.63 MG/3ML IN NEBU
0.6300 mg | INHALATION_SOLUTION | Freq: Three times a day (TID) | RESPIRATORY_TRACT | Status: DC
  Administered 2020-10-18 – 2020-10-20 (×9): 0.63 mg via RESPIRATORY_TRACT
  Filled 2020-10-17 (×11): qty 3

## 2020-10-17 MED ORDER — BISACODYL 5 MG PO TBEC
10.0000 mg | DELAYED_RELEASE_TABLET | Freq: Every day | ORAL | Status: DC
  Administered 2020-10-18 – 2020-10-20 (×3): 10 mg via ORAL
  Filled 2020-10-17 (×4): qty 2

## 2020-10-17 MED ORDER — ASPIRIN 300 MG RE SUPP: 300.0000 mg | Freq: Once | RECTAL | Status: AC

## 2020-10-17 MED ORDER — GLYCOPYRROLATE PF 0.2 MG/ML IJ SOSY
PREFILLED_SYRINGE | INTRAMUSCULAR | Status: AC
  Filled 2020-10-17: qty 1

## 2020-10-17 MED ORDER — LEVALBUTEROL TARTRATE 45 MCG/ACT IN AERO
2.0000 | INHALATION_SPRAY | Freq: Four times a day (QID) | RESPIRATORY_TRACT | Status: DC
  Filled 2020-10-17: qty 15

## 2020-10-17 MED ORDER — MILRINONE LACTATE IN DEXTROSE 20-5 MG/100ML-% IV SOLN
0.2500 ug/kg/min | INTRAVENOUS | Status: DC
  Administered 2020-10-17 – 2020-10-18 (×2): 0.25 ug/kg/min via INTRAVENOUS
  Filled 2020-10-17: qty 100

## 2020-10-17 MED ORDER — SODIUM CHLORIDE 0.9 % IV SOLN: INTRAVENOUS | Status: DC

## 2020-10-17 MED ORDER — PLASMA-LYTE A IV SOLN
INTRAVENOUS | Status: DC | PRN
  Administered 2020-10-17: 1000 mL via INTRAVASCULAR

## 2020-10-17 MED ORDER — ACETAMINOPHEN 650 MG RE SUPP
650.0000 mg | Freq: Once | RECTAL | Status: AC
  Administered 2020-10-17: 650 mg via RECTAL

## 2020-10-17 MED ORDER — PHENYLEPHRINE HCL-NACL 10-0.9 MG/250ML-% IV SOLN
INTRAVENOUS | Status: DC | PRN
  Administered 2020-10-17: 50 ug/min via INTRAVENOUS

## 2020-10-17 MED ORDER — OXYCODONE HCL 5 MG PO TABS
5.0000 mg | ORAL_TABLET | ORAL | Status: DC | PRN
  Administered 2020-10-18 – 2020-10-20 (×5): 10 mg via ORAL
  Administered 2020-10-20: 5 mg via ORAL
  Administered 2020-10-21 – 2020-10-22 (×4): 10 mg via ORAL
  Filled 2020-10-17 (×4): qty 2
  Filled 2020-10-17: qty 1
  Filled 2020-10-17 (×5): qty 2

## 2020-10-17 MED ORDER — ROCURONIUM BROMIDE 10 MG/ML (PF) SYRINGE
PREFILLED_SYRINGE | INTRAVENOUS | Status: AC
  Filled 2020-10-17: qty 10

## 2020-10-17 MED ORDER — EPHEDRINE SULFATE-NACL 50-0.9 MG/10ML-% IV SOSY
PREFILLED_SYRINGE | INTRAVENOUS | Status: DC | PRN
  Administered 2020-10-17: 10 mg via INTRAVENOUS

## 2020-10-17 MED ORDER — PROPOFOL 10 MG/ML IV BOLUS
INTRAVENOUS | Status: DC | PRN
  Administered 2020-10-17: 70 mg via INTRAVENOUS

## 2020-10-17 MED ORDER — GLYCOPYRROLATE 0.2 MG/ML IJ SOLN
INTRAMUSCULAR | Status: DC | PRN
  Administered 2020-10-17: .2 mg via INTRAVENOUS

## 2020-10-17 MED ORDER — MIDAZOLAM HCL (PF) 10 MG/2ML IJ SOLN
INTRAMUSCULAR | Status: AC
  Filled 2020-10-17: qty 2

## 2020-10-17 SURGICAL SUPPLY — 106 items
ADAPTER CARDIO PERF ANTE/RETRO (ADAPTER) ×5 IMPLANT
APPLICATOR TIP COSEAL (VASCULAR PRODUCTS) ×1 IMPLANT
BAG DECANTER FOR FLEXI CONT (MISCELLANEOUS) ×5 IMPLANT
BLADE CLIPPER SURG (BLADE) ×5 IMPLANT
BLADE MINI RND TIP GREEN BEAV (BLADE) ×1 IMPLANT
BLADE STERNUM SYSTEM 6 (BLADE) ×5 IMPLANT
BLADE SURG 10 STRL SS (BLADE) ×1 IMPLANT
BNDG ELASTIC 4X5.8 VLCR STR LF (GAUZE/BANDAGES/DRESSINGS) ×5 IMPLANT
BNDG ELASTIC 6X5.8 VLCR STR LF (GAUZE/BANDAGES/DRESSINGS) ×5 IMPLANT
BNDG GAUZE ELAST 4 BULKY (GAUZE/BANDAGES/DRESSINGS) ×5 IMPLANT
CANISTER SUCT 3000ML PPV (MISCELLANEOUS) ×5 IMPLANT
CANNULA NON VENT 20FR 12 (CANNULA) ×1 IMPLANT
CATH CPB KIT HENDRICKSON (MISCELLANEOUS) ×5 IMPLANT
CATH ROBINSON RED A/P 18FR (CATHETERS) ×10 IMPLANT
CLIP RETRACTION 3.0MM CORONARY (MISCELLANEOUS) ×5 IMPLANT
CLIP VESOCCLUDE SM WIDE 24/CT (CLIP) ×15 IMPLANT
CONN ST 1/4X3/8  BEN (MISCELLANEOUS) ×2
CONN ST 1/4X3/8 BEN (MISCELLANEOUS) IMPLANT
CONTAINER PROTECT SURGISLUSH (MISCELLANEOUS) ×6 IMPLANT
COVER MAYO STAND STRL (DRAPES) ×1 IMPLANT
DERMABOND ADVANCED (GAUZE/BANDAGES/DRESSINGS) ×2
DERMABOND ADVANCED .7 DNX12 (GAUZE/BANDAGES/DRESSINGS) ×4 IMPLANT
DRAIN CHANNEL 28F RND 3/8 FF (WOUND CARE) ×16 IMPLANT
DRAPE CARDIOVASCULAR INCISE (DRAPES) ×1
DRAPE SRG 135X102X78XABS (DRAPES) ×4 IMPLANT
DRAPE WARM FLUID 44X44 (DRAPES) ×5 IMPLANT
DRSG AQUACEL AG ADV 3.5X14 (GAUZE/BANDAGES/DRESSINGS) ×5 IMPLANT
ELECT CAUTERY BLADE 6.4 (BLADE) ×5 IMPLANT
ELECT REM PT RETURN 9FT ADLT (ELECTROSURGICAL) ×10
ELECTRODE REM PT RTRN 9FT ADLT (ELECTROSURGICAL) ×8 IMPLANT
FELT TEFLON 1X6 (MISCELLANEOUS) ×9 IMPLANT
GAUZE SPONGE 4X4 12PLY STRL (GAUZE/BANDAGES/DRESSINGS) ×9 IMPLANT
GAUZE SPONGE 4X4 12PLY STRL LF (GAUZE/BANDAGES/DRESSINGS) ×2 IMPLANT
GLOVE NEODERM STRL 7.5 LF PF (GLOVE) ×12 IMPLANT
GLOVE SURG MICRO LTX SZ6.5 (GLOVE) ×2 IMPLANT
GLOVE SURG NEODERM 7.5  LF PF (GLOVE) ×3
GLOVE SURG POLYISO LF SZ6 (GLOVE) ×4 IMPLANT
GLOVE SURG UNDER POLY LF SZ6.5 (GLOVE) ×4 IMPLANT
GOWN STRL REUS W/ TWL LRG LVL3 (GOWN DISPOSABLE) ×16 IMPLANT
GOWN STRL REUS W/TWL LRG LVL3 (GOWN DISPOSABLE) ×10
HEMOSTAT POWDER SURGIFOAM 1G (HEMOSTASIS) ×10 IMPLANT
INSERT FOGARTY XLG (MISCELLANEOUS) ×5 IMPLANT
INSERT SUTURE HOLDER (MISCELLANEOUS) ×5 IMPLANT
KIT APPLICATOR RATIO 11:1 (KITS) ×1 IMPLANT
KIT BASIN OR (CUSTOM PROCEDURE TRAY) ×5 IMPLANT
KIT SUCTION CATH 14FR (SUCTIONS) ×5 IMPLANT
KIT TURNOVER KIT B (KITS) ×5 IMPLANT
KIT VASOVIEW HEMOPRO 2 VH 4000 (KITS) ×4 IMPLANT
MARKER GRAFT CORONARY BYPASS (MISCELLANEOUS) ×12 IMPLANT
NDL 18GX1X1/2 (RX/OR ONLY) (NEEDLE) ×4 IMPLANT
NEEDLE 18GX1X1/2 (RX/OR ONLY) (NEEDLE) ×5 IMPLANT
NS IRRIG 1000ML POUR BTL (IV SOLUTION) ×25 IMPLANT
PACK E OPEN HEART (SUTURE) ×5 IMPLANT
PACK OPEN HEART (CUSTOM PROCEDURE TRAY) ×5 IMPLANT
PACK PLATELET PROCEDURE 60 (MISCELLANEOUS) ×1 IMPLANT
PACK SPY-PHI (KITS) ×1 IMPLANT
PAD ARMBOARD 7.5X6 YLW CONV (MISCELLANEOUS) ×10 IMPLANT
PAD ELECT DEFIB RADIOL ZOLL (MISCELLANEOUS) ×5 IMPLANT
PENCIL BUTTON HOLSTER BLD 10FT (ELECTRODE) ×5 IMPLANT
POSITIONER HEAD DONUT 9IN (MISCELLANEOUS) ×5 IMPLANT
POWDER SURGICEL 3.0 GRAM (HEMOSTASIS) ×5 IMPLANT
PUNCH AORTIC ROTATE 4.5MM 8IN (MISCELLANEOUS) ×1 IMPLANT
SEALANT SURG COSEAL 4ML (VASCULAR PRODUCTS) ×5 IMPLANT
SEALANT SURG COSEAL 8ML (VASCULAR PRODUCTS) ×2 IMPLANT
SET MPS 3-ND DEL (MISCELLANEOUS) ×1 IMPLANT
SPONGE LAP 4X18 RFD (DISPOSABLE) ×1 IMPLANT
STOPCOCK 4 WAY LG BORE MALE ST (IV SETS) ×1 IMPLANT
SUT BONE WAX W31G (SUTURE) ×5 IMPLANT
SUT MNCRL AB 3-0 PS2 18 (SUTURE) ×10 IMPLANT
SUT MNCRL AB 4-0 PS2 18 (SUTURE) ×2 IMPLANT
SUT PDS AB 1 CTX 36 (SUTURE) ×10 IMPLANT
SUT PROLENE 3 0 SH DA (SUTURE) ×5 IMPLANT
SUT PROLENE 5 0 C 1 36 (SUTURE) ×1 IMPLANT
SUT PROLENE 6 0 C 1 30 (SUTURE) ×15 IMPLANT
SUT PROLENE 7 0 BV1 MDA (SUTURE) ×1 IMPLANT
SUT PROLENE 8 0 BV175 6 (SUTURE) ×4 IMPLANT
SUT PROLENE BLUE 7 0 (SUTURE) ×5 IMPLANT
SUT SILK  1 MH (SUTURE) ×2
SUT SILK 1 MH (SUTURE) IMPLANT
SUT SILK 2 0 (SUTURE) ×1
SUT SILK 2 0 SH CR/8 (SUTURE) IMPLANT
SUT SILK 2-0 18XBRD TIE 12 (SUTURE) IMPLANT
SUT SILK 3 0 SH CR/8 (SUTURE) IMPLANT
SUT SILK 4 0 TIE 10X30 (SUTURE) ×1 IMPLANT
SUT STEEL 6MS V (SUTURE) ×5 IMPLANT
SUT STEEL SZ 6 DBL 3X14 BALL (SUTURE) ×5 IMPLANT
SUT VIC AB 2-0 CT1 27 (SUTURE) ×2
SUT VIC AB 2-0 CT1 TAPERPNT 27 (SUTURE) IMPLANT
SUT VIC AB 2-0 CTX 27 (SUTURE) IMPLANT
SUT VIC AB 3-0 X1 27 (SUTURE) IMPLANT
SYR 10ML LL (SYRINGE) IMPLANT
SYR 30ML LL (SYRINGE) ×4 IMPLANT
SYR 3ML LL SCALE MARK (SYRINGE) ×4 IMPLANT
SYSTEM SAHARA CHEST DRAIN ATS (WOUND CARE) ×6 IMPLANT
TAPE CLOTH SURG 4X10 WHT LF (GAUZE/BANDAGES/DRESSINGS) ×2 IMPLANT
TAPE PAPER 2X10 WHT MICROPORE (GAUZE/BANDAGES/DRESSINGS) ×1 IMPLANT
TIP DUAL SPRAY TOPICAL (TIP) ×1 IMPLANT
TOWEL GREEN STERILE (TOWEL DISPOSABLE) ×5 IMPLANT
TOWEL GREEN STERILE FF (TOWEL DISPOSABLE) ×5 IMPLANT
TRAY CATH LUMEN 1 20CM STRL (SET/KITS/TRAYS/PACK) ×1 IMPLANT
TRAY FOLEY SLVR 16FR TEMP STAT (SET/KITS/TRAYS/PACK) ×5 IMPLANT
TUBING ART PRESS 48 MALE/FEM (TUBING) ×2 IMPLANT
TUBING LAP HI FLOW INSUFFLATIO (TUBING) ×4 IMPLANT
UNDERPAD 30X36 HEAVY ABSORB (UNDERPADS AND DIAPERS) ×6 IMPLANT
WATER STERILE IRR 1000ML POUR (IV SOLUTION) ×10 IMPLANT
WATER STERILE IRR 1000ML UROMA (IV SOLUTION) IMPLANT

## 2020-10-17 NOTE — Anesthesia Procedure Notes (Signed)
Arterial Line Insertion Start/End6/15/2022 7:55 AM, 10/17/2020 8:00 AM Performed by: CRNA  Patient location: Pre-op. Preanesthetic checklist: patient identified, IV checked, site marked, risks and benefits discussed, surgical consent, monitors and equipment checked, pre-op evaluation, timeout performed and anesthesia consent Lidocaine 1% used for infiltration Right, radial was placed Catheter size: 20 G Hand hygiene performed  and maximum sterile barriers used   Attempts: 1 Procedure performed without using ultrasound guided technique. Following insertion, dressing applied and Biopatch. Post procedure assessment: normal  Patient tolerated the procedure well with no immediate complications.

## 2020-10-17 NOTE — Progress Notes (Signed)
  Echocardiogram Echocardiogram Transesophageal has been performed.  Augustine Radar 10/17/2020, 2:44 PM

## 2020-10-17 NOTE — Procedures (Signed)
Extubation Procedure Note  Patient Details:   Name: Corey Salas DOB: 16-Mar-1961 MRN: [id number]   Airway Documentation:  Airway 8 mm (Active)  Secured at (cm) 23 cm 10/17/20 1923  Measured From Lips 10/17/20 1923  Secured Location Right 10/17/20 1923  Secured By Pink Tape 10/17/20 1923  Prone position No 10/17/20 1543  Site Condition Dry 10/17/20 1923   Patient extubated to 5L [place]. RT did NIF/VC with patient. Patient got 800 on VC and -22 on NIF with good effort. Cuff leak present. No stridor noted at this time.  Evaluation  O2 sats: stable throughout Complications: No apparent complications Patient did tolerate procedure well. Bilateral Breath Sounds: Clear, Diminished   Yes  Letta Median 10/17/2020, 2117

## 2020-10-17 NOTE — Anesthesia Procedure Notes (Signed)
Central Venous Catheter Insertion Performed by: Audry Pili, MD, anesthesiologist Start/End6/15/2022 8:10 AM, 10/17/2020 8:16 AM Patient location: Pre-op. Preanesthetic checklist: patient identified, IV checked, risks and benefits discussed, surgical consent, monitors and equipment checked, pre-op evaluation, timeout performed and anesthesia consent Position: Trendelenburg Lidocaine 1% used for infiltration and patient sedated Hand hygiene performed , maximum sterile barriers used  and Seldinger technique used Catheter size: 8.5 Fr Central line was placed.MAC introducer Procedure performed using ultrasound guided technique. Ultrasound Notes:anatomy identified, needle tip was noted to be adjacent to the nerve/plexus identified, no ultrasound evidence of intravascular and/or intraneural injection and image(s) printed for medical record Attempts: 1 Following insertion, line sutured, dressing applied and Biopatch. Post procedure assessment: blood return through all ports, no air and free fluid flow  Patient tolerated the procedure well with no immediate complications.

## 2020-10-17 NOTE — Anesthesia Postprocedure Evaluation (Signed)
Anesthesia Post Note  Patient: Corey Salas  Procedure(s) Performed: CORONARY ARTERY BYPASS GRAFTING (CABG)x 4 USING BILATERAL INTERNAL MAMMARIES,  LEFT RADIAL ARTERY, AND RIGHT GSV, ON CARDIOPULMONARY BYPASS. (Chest) TRANSESOPHAGEAL ECHOCARDIOGRAM (TEE) INDOCYANINE GREEN FLUORESCENCE IMAGING (ICG) RADIAL ARTERY HARVEST (Left: Arm Lower) VEIN HARVEST OF RIGHT GREATER SAPHENOUS VEIN (Right) APPLICATION OF CELL SAVER     Patient location during evaluation: ICU Anesthesia Type: General Level of consciousness: sedated and patient remains intubated per anesthesia plan Pain management: pain level controlled Vital Signs Assessment: post-procedure vital signs reviewed and stable Respiratory status: patient remains intubated per anesthesia plan Cardiovascular status: stable Postop Assessment: no apparent nausea or vomiting Anesthetic complications: no   No notable events documented.  Last Vitals:  Vitals:   10/17/20 0701 10/17/20 1543  BP:  136/78  Pulse: (!) 54 89  Resp: (!) 22 12  Temp:    SpO2: 94% 99%    Last Pain:  Vitals:   10/17/20 1600  TempSrc: Core  PainSc:                  Beryle Lathe

## 2020-10-17 NOTE — Anesthesia Procedure Notes (Signed)
Central Venous Catheter Insertion Performed by: Beryle Lathe, MD, anesthesiologist Start/End6/15/2022 8:16 AM, 10/17/2020 8:17 AM Patient location: Pre-op. Preanesthetic checklist: patient identified, IV checked, risks and benefits discussed, surgical consent, monitors and equipment checked, pre-op evaluation, timeout performed and anesthesia consent Position: Trendelenburg Hand hygiene performed  and maximum sterile barriers used  Total catheter length 10. PA cath was placed.Swan type:thermodilution PA Cath depth:50 Procedure performed without using ultrasound guided technique. Attempts: 1 Patient tolerated the procedure well with no immediate complications.

## 2020-10-17 NOTE — Progress Notes (Signed)
      [address]       [name] [address]             [phone]      S/p CABG x 4  Intubated, starting to wake up  BP 103/84   Pulse 89   Temp 98.06 F (36.7 C)   Resp 16   Ht 5\' 11"  (1.803 m)   Wt 79.2 kg   SpO2 99%   BMI 24.35 kg/m   Intake/Output Summary (Last 24 hours) at 10/17/2020 2042 Last data filed at 10/17/2020 2000 Gross per 24 hour  Intake 4455.4 ml  Output 3538 ml  Net 917.4 ml   Minimal CT output  Hct =39  Doing well early postop  10/19/2020 C. Viviann Spare, MD [facility] [phone]

## 2020-10-17 NOTE — Brief Op Note (Addendum)
10/15/2020 - 10/17/2020  2:05 PM  PATIENT:  Corey Salas  60 y.o. male  PRE-OPERATIVE DIAGNOSIS:  CAD  POST-OPERATIVE DIAGNOSIS:  CAD  PROCEDURE:  TRANSESOPHAGEAL ECHOCARDIOGRAM (TEE), CORONARY ARTERY BYPASS GRAFTING (CABG)x 4 (LIMA to LAD, RIGHT RIMA to RAMUS INTERMEDIATE, LEFT RADIAL ARTERY SEQUENTIALLY to PDA and PLB, USING BILATERAL INTERNAL MAMMARIES,  LEFT RADIAL ARTERY, AND a portion of RIGHT GREATER SAPHENOUS VEIN from the thigh (HARVESTED OPEN), INDOCYANINE GREEN FLUORESCENCE IMAGING (ICG)  LEFT RADIAL ARTERY HARVEST TIME: 36 minutes; LEFT RADIAL ARTERY PREP TIME: 7 minutes RIGHT THIGH GSV HARVEST TIME: 9 minutes;RIGHT THIGH GSV PREP TIME: 3 minutes  SURGEON:  Surgeon(s) and Role:    Linden Dolin, MD - Primary  PHYSICIAN ASSISTANT: Doree Fudge PA-C  ASSISTANTS: Mardi Mainland RNFA  ANESTHESIA:   general  EBL:  Per anesthesia and perfusion record  DRAINS:  Chest tubes placed in the mediastinal and pleural spaces    LOCAL MEDICATIONS USED:  OTHER Exparel  COUNTS CORRECT:  YES  DICTATION: .Dragon Dictation  PLAN OF CARE: Admit to inpatient   PATIENT DISPOSITION:  ICU - intubated and hemodynamically stable.   Delay start of Pharmacological VTE agent (>24hrs) due to surgical blood loss or risk of bleeding: yes  BASELINE WEIGHT: 79.2 kg  The radial artery was used to graft the PDA and right PLA as a sequenced graft, but it would not reach the aorta due to RV dilation. A short piece of vein was taken to lengthen the radial graft, which then reached the aorta for proximal anastomosis comfortably. Daphene Chisholm Z. Vickey Sages, MD [phone]

## 2020-10-17 NOTE — Progress Notes (Signed)
Pt transported to OR by this RN and OR staff.

## 2020-10-17 NOTE — H&P (Signed)
History and Physical Interval Note:  10/17/2020 7:52 AM  Corey Salas  has presented today for surgery, with the diagnosis of CAD.  The various methods of treatment have been discussed with the patient and family. After consideration of risks, benefits and other options for treatment, the patient has consented to  Procedure(s): CORONARY ARTERY BYPASS GRAFTING (CABG) (N/A) TRANSESOPHAGEAL ECHOCARDIOGRAM (TEE) (N/A) INDOCYANINE GREEN FLUORESCENCE IMAGING (ICG) (N/A) as a surgical intervention.  The patient's history has been reviewed, patient examined, no change in status, stable for surgery.  I have reviewed the patient's chart and labs.  Questions were answered to the patient's satisfaction.     Merri Brunette Talishia Betzler  Will also plan left radial artery harvesting and bilateral IMA harvesting. Dandrea Medders Z. Vickey Sages, MD [phone]

## 2020-10-17 NOTE — Transfer of Care (Addendum)
Immediate Anesthesia Transfer of Care Note  Patient: OAKLEE SUNGA  Procedure(s) Performed: CORONARY ARTERY BYPASS GRAFTING (CABG)x 4 USING BILATERAL INTERNAL MAMMARIES,  LEFT RADIAL ARTERY, AND RIGHT GSV, ON CARDIOPULMONARY BYPASS. (Chest) TRANSESOPHAGEAL ECHOCARDIOGRAM (TEE) INDOCYANINE GREEN FLUORESCENCE IMAGING (ICG) RADIAL ARTERY HARVEST (Left: Arm Lower) VEIN HARVEST OF RIGHT GREATER SAPHENOUS VEIN (Right) APPLICATION OF CELL SAVER  Patient Location: 2-Heart ICU  Anesthesia Type:General  Level of Consciousness: sedated, unresponsive and Patient remains intubated per anesthesia plan  Airway & Oxygen Therapy: Patient remains intubated per anesthesia plan and Patient placed on Ventilator (see vital sign flow sheet for setting)  Post-op Assessment: Report given to RN and Post -op Vital signs reviewed and stable  Post vital signs: Reviewed and stable  Last Vitals:  Vitals Value Taken Time  BP 136/78 10/17/20 1543  Temp 36.4 C 10/17/20 1600  Pulse 89 10/17/20 1600  Resp 12 10/17/20 1600  SpO2 100 % 10/17/20 1600  Vitals shown include unvalidated device data.  Last Pain:  Vitals:   10/17/20 0701  TempSrc:   PainSc: 0-No pain         Complications: No notable events documented.

## 2020-10-17 NOTE — Anesthesia Procedure Notes (Signed)
Procedure Name: Intubation Date/Time: 10/17/2020 8:58 AM Performed by: Marena Chancy, CRNA Pre-anesthesia Checklist: Patient identified, Emergency Drugs available, Suction available and Patient being monitored Patient Re-evaluated:Patient Re-evaluated prior to induction Oxygen Delivery Method: Circle System Utilized Preoxygenation: Pre-oxygenation with 100% oxygen Induction Type: IV induction Ventilation: Mask ventilation without difficulty and Oral airway inserted - appropriate to patient size Laryngoscope Size: Glidescope and 4 Grade View: Grade I Tube type: Oral Tube size: 8.0 mm Number of attempts: 1 Airway Equipment and Method: Stylet and Oral airway Placement Confirmation: ETT inserted through vocal cords under direct vision, positive ETCO2 and breath sounds checked- equal and bilateral Tube secured with: Tape Dental Injury: Teeth and Oropharynx as per pre-operative assessment

## 2020-10-17 NOTE — Discharge Instructions (Signed)

## 2020-10-18 ENCOUNTER — Inpatient Hospital Stay (HOSPITAL_COMMUNITY): Payer: Managed Care, Other (non HMO)

## 2020-10-18 ENCOUNTER — Other Ambulatory Visit: Payer: Self-pay

## 2020-10-18 ENCOUNTER — Encounter (HOSPITAL_COMMUNITY): Payer: Self-pay | Admitting: Cardiothoracic Surgery

## 2020-10-18 LAB — CBC
HCT: 32.8 % — ABNORMAL LOW (ref 39.0–52.0)
HCT: 31.7 % — ABNORMAL LOW (ref 39.0–52.0)
Hemoglobin: 10.9 g/dL — ABNORMAL LOW (ref 13.0–17.0)
Hemoglobin: 10.6 g/dL — ABNORMAL LOW (ref 13.0–17.0)
MCH: 36 pg — ABNORMAL HIGH (ref 26.0–34.0)
MCH: 35.9 pg — ABNORMAL HIGH (ref 26.0–34.0)
MCHC: 33.2 g/dL (ref 30.0–36.0)
MCHC: 33.4 g/dL (ref 30.0–36.0)
MCV: 108.3 fL — ABNORMAL HIGH (ref 80.0–100.0)
MCV: 107.5 fL — ABNORMAL HIGH (ref 80.0–100.0)
Platelets: 101 10*3/uL — ABNORMAL LOW (ref 150–400)
Platelets: 93 10*3/uL — ABNORMAL LOW (ref 150–400)
RBC: 3.03 MIL/uL — ABNORMAL LOW (ref 4.22–5.81)
RBC: 2.95 MIL/uL — ABNORMAL LOW (ref 4.22–5.81)
RDW: 14.8 % (ref 11.5–15.5)
RDW: 14.9 % (ref 11.5–15.5)
WBC: 12.7 10*3/uL — ABNORMAL HIGH (ref 4.0–10.5)
WBC: 13.1 10*3/uL — ABNORMAL HIGH (ref 4.0–10.5)
nRBC: 0 % (ref 0.0–0.2)
nRBC: 0 % (ref 0.0–0.2)

## 2020-10-18 LAB — POCT I-STAT 7, (LYTES, BLD GAS, ICA,H+H)
Acid-base deficit: 2 mmol/L (ref 0.0–2.0)
Bicarbonate: 22.3 mmol/L (ref 20.0–28.0)
Calcium, Ion: 1.02 mmol/L — ABNORMAL LOW (ref 1.15–1.40)
HCT: 34 % — ABNORMAL LOW (ref 39.0–52.0)
Hemoglobin: 11.6 g/dL — ABNORMAL LOW (ref 13.0–17.0)
O2 Saturation: 98 %
Patient temperature: 37.4
Potassium: 4.1 mmol/L (ref 3.5–5.1)
Sodium: 140 mmol/L (ref 135–145)
TCO2: 23 mmol/L (ref 22–32)
pCO2 arterial: 36.1 mmHg (ref 32.0–48.0)
pH, Arterial: 7.4 (ref 7.350–7.450)
pO2, Arterial: 101 mmHg (ref 83.0–108.0)

## 2020-10-18 LAB — BASIC METABOLIC PANEL
Anion gap: 9 (ref 5–15)
Anion gap: 10 (ref 5–15)
BUN: 7 mg/dL (ref 6–20)
BUN: 6 mg/dL (ref 6–20)
CO2: 24 mmol/L (ref 22–32)
CO2: 21 mmol/L — ABNORMAL LOW (ref 22–32)
Calcium: 7.2 mg/dL — ABNORMAL LOW (ref 8.9–10.3)
Calcium: 7.3 mg/dL — ABNORMAL LOW (ref 8.9–10.3)
Chloride: 108 mmol/L (ref 98–111)
Chloride: 103 mmol/L (ref 98–111)
Creatinine, Ser: 0.93 mg/dL (ref 0.61–1.24)
Creatinine, Ser: 0.87 mg/dL (ref 0.61–1.24)
GFR, Estimated: >60 mL/min (ref >60)
GFR, Estimated: >60 mL/min (ref >60)
Glucose, Bld: 124 mg/dL — ABNORMAL HIGH (ref 70–99)
Glucose, Bld: 105 mg/dL — ABNORMAL HIGH (ref 70–99)
Potassium: 4.2 mmol/L (ref 3.5–5.1)
Potassium: 3.8 mmol/L (ref 3.5–5.1)
Sodium: 141 mmol/L (ref 135–145)
Sodium: 134 mmol/L — ABNORMAL LOW (ref 135–145)

## 2020-10-18 LAB — COOXEMETRY PANEL
Carboxyhemoglobin: 1.1 % (ref 0.5–1.5)
Methemoglobin: 1 % (ref 0.0–1.5)
O2 Saturation: 61.6 %
Total hemoglobin: 12.7 g/dL (ref 12.0–16.0)

## 2020-10-18 LAB — GLUCOSE, CAPILLARY
Glucose-Capillary: 114 mg/dL — ABNORMAL HIGH (ref 70–99)
Glucose-Capillary: 115 mg/dL — ABNORMAL HIGH (ref 70–99)
Glucose-Capillary: 116 mg/dL — ABNORMAL HIGH (ref 70–99)
Glucose-Capillary: 122 mg/dL — ABNORMAL HIGH (ref 70–99)
Glucose-Capillary: 122 mg/dL — ABNORMAL HIGH (ref 70–99)
Glucose-Capillary: 124 mg/dL — ABNORMAL HIGH (ref 70–99)
Glucose-Capillary: 129 mg/dL — ABNORMAL HIGH (ref 70–99)
Glucose-Capillary: 130 mg/dL — ABNORMAL HIGH (ref 70–99)
Glucose-Capillary: 135 mg/dL — ABNORMAL HIGH (ref 70–99)
Glucose-Capillary: 135 mg/dL — ABNORMAL HIGH (ref 70–99)
Glucose-Capillary: 150 mg/dL — ABNORMAL HIGH (ref 70–99)
Glucose-Capillary: 162 mg/dL — ABNORMAL HIGH (ref 70–99)
Glucose-Capillary: 164 mg/dL — ABNORMAL HIGH (ref 70–99)
Glucose-Capillary: 179 mg/dL — ABNORMAL HIGH (ref 70–99)
Glucose-Capillary: 71 mg/dL (ref 70–99)

## 2020-10-18 LAB — MAGNESIUM
Magnesium: 2.4 mg/dL (ref 1.7–2.4)
Magnesium: 2.3 mg/dL (ref 1.7–2.4)

## 2020-10-18 MED ORDER — THIAMINE HCL 100 MG/ML IJ SOLN
Freq: Once | INTRAVENOUS | Status: AC
  Filled 2020-10-18: qty 1000

## 2020-10-18 MED ORDER — ALPRAZOLAM 0.5 MG PO TABS
1.0000 mg | ORAL_TABLET | Freq: Three times a day (TID) | ORAL | Status: DC | PRN
  Administered 2020-10-18 – 2020-10-22 (×9): 1 mg via ORAL
  Filled 2020-10-18 (×9): qty 2

## 2020-10-18 MED ORDER — ISOSORBIDE DINITRATE 10 MG PO TABS
10.0000 mg | ORAL_TABLET | Freq: Three times a day (TID) | ORAL | Status: DC
  Administered 2020-10-18 (×3): 10 mg via ORAL
  Filled 2020-10-18 (×3): qty 1

## 2020-10-18 MED ORDER — DIAZEPAM 2 MG PO TABS
2.0000 mg | ORAL_TABLET | Freq: Three times a day (TID) | ORAL | Status: AC
  Administered 2020-10-18 – 2020-10-19 (×6): 2 mg via ORAL
  Filled 2020-10-18 (×6): qty 1

## 2020-10-18 MED ORDER — INSULIN ASPART 100 UNIT/ML IJ SOLN
0.0000 [IU] | INTRAMUSCULAR | Status: DC
  Administered 2020-10-18 – 2020-10-21 (×4): 2 [IU] via SUBCUTANEOUS

## 2020-10-18 MED ORDER — ASPIRIN EC 81 MG PO TBEC
81.0000 mg | DELAYED_RELEASE_TABLET | Freq: Every day | ORAL | Status: DC
  Administered 2020-10-18 – 2020-10-22 (×5): 81 mg via ORAL
  Filled 2020-10-18 (×5): qty 1

## 2020-10-18 MED FILL — Mannitol IV Soln 20%: INTRAVENOUS | Qty: 500 | Status: AC

## 2020-10-18 MED FILL — Heparin Sodium (Porcine) Inj 1000 Unit/ML: INTRAMUSCULAR | Qty: 2 | Status: AC

## 2020-10-18 MED FILL — Sodium Bicarbonate IV Soln 8.4%: INTRAVENOUS | Qty: 100 | Status: AC

## 2020-10-18 MED FILL — Epinephrine Inj 30 MG/30ML (1 MG/ML) (1:1000): INTRAMUSCULAR | Qty: 5 | Status: AC

## 2020-10-18 MED FILL — Magnesium Sulfate Inj 50%: INTRAMUSCULAR | Qty: 10 | Status: AC

## 2020-10-18 MED FILL — Sodium Chloride IV Soln 0.9%: INTRAVENOUS | Qty: 250 | Status: AC

## 2020-10-18 MED FILL — Heparin Sodium (Porcine) Inj 1000 Unit/ML: INTRAMUSCULAR | Qty: 30 | Status: AC

## 2020-10-18 MED FILL — Potassium Chloride Inj 2 mEq/ML: INTRAVENOUS | Qty: 40 | Status: AC

## 2020-10-18 MED FILL — Electrolyte-R (PH 7.4) Solution: INTRAVENOUS | Qty: 6000 | Status: AC

## 2020-10-18 MED FILL — Lidocaine HCl Local Soln Prefilled Syringe 100 MG/5ML (2%): INTRAMUSCULAR | Qty: 5 | Status: AC

## 2020-10-18 NOTE — Addendum Note (Signed)
Addendum  created 10/18/20 0804 by Adair Laundry, CRNA   Order list changed, Pharmacy for encounter modified

## 2020-10-18 NOTE — Progress Notes (Signed)
CSW received consult for substance use resources for patient.CSW spoke with patient and patients spouse at bedside. CSW offered patient outpatient substance use treatment services resources. Patient accepted.

## 2020-10-18 NOTE — Discharge Summary (Addendum)
Physician Discharge Summary       [address] [place]       [name] [address]             [phone]    Patient ID: Corey Salas MRN: [id number] DOB/AGE: [birth year] 60 y.o.  Admit date: 10/15/2020 Discharge date: 10/22/2020  Admission Diagnoses: CAD (coronary artery disease) Discharge Diagnoses:  S/P CABG x 4 2. Expected post op blood loss anemia 3. Thrombocytopenia 4. History of the following:       Neuromuscular disorder (HCC)      neuropathy in both feet   Neuropathy      "bottom of my feet, they say it is from alcohol, I just drink beer now, not spirits."   PTSD (post-traumatic stress disorder)   History of STEMI History of Stroke (HCC)-08/23/20 slight weakness in right hand - can type and write- "I have to concentrate to keep pen in hand."  Consults: None  Procedure (s):  TRANSESOPHAGEAL ECHOCARDIOGRAM (TEE), CORONARY ARTERY BYPASS GRAFTING (CABG)x 4 (LIMA to LAD, RIGHT RIMA to RAMUS INTERMEDIATE, LEFT RADIAL ARTERY SEQUENTIALLY to PDA and PLB, USING BILATERAL INTERNAL MAMMARIES,  LEFT RADIAL ARTERY, AND a portion of RIGHT GREATER SAPHENOUS VEIN from the thigh (HARVESTED OPEN), INDOCYANINE GREEN FLUORESCENCE IMAGING (ICG) by Dr. Vickey Sages on 10/17/2020.   HPI: Pleasant 60 year old man presents for surgical consultation of multivessel coronary artery disease.  States he began feeling fatigue this past December.  He was on vacation in [place] in April when he had a STEMI presented urgently to the local hospital.  He underwent PCI of the RCA at that point.  He was advised to follow-up which he did at [facility] this past month.  He underwent repeat cath demonstrating severe multivessel disease including left main disease.  His symptoms have been stable with fatigue and dyspnea on exertion.  He denies any orthopnea or PND.  Patient's recent history is also notable for a stroke with mild right body residual symptoms of weakness.  He is status post left sided  TCAR. Per Dr. Vickey Sages, patient's situation is complicated by recent PCI with drug-eluting stent to the RCA.  He also has undergone recent TCAR.  Nevertheless, 80% left main is associated with high mortality treated medically.  Dr. Vickey Sages suggested CABG operation as soon as possible. Pre operative carotid duplex US showed no significant right internal carotid artery stenosis and a 60-79% left internal carotid artery stenosis. The patient underwent a CABG x 4 on 10/17/2020.   Hospital Course:  Patient was transferred in stable condition from the OR to [facility] ICU. Patient was extubated without difficulty late the evening of surgery. Patient was weaned off Epinephrine  and Midodrine drips. Theone Murdoch, a line, chest tubes, and foley were all removed early in his post operative course. He was started on Lopressor. He was volume overloaded and diuresed accordingly. He was started on Isordil 10 mg tid for radial artery graft. He was started on Plavix and ec asa was decreased to 81 mg daily. On 06/17, his BP was more labile so both Isordil and Lopressor were held. He was felt surgically stable for transfer from the ICU to [facility] for further convalescence on 10/19/2020. On the floor he continued to progress. We encouraged pulmonary toilet and use of incentive spirometer and flutter valve. We stated mucinex and encouraged coughing secretions. He was down to 2L and we decided to do a 6 minute walk test to determine needs for home oxygen. Today, he is  ambulating, tolerating 2L, his incisions are healing well, and he is ready for discharge home with family.     Latest Vital Signs: Blood pressure 95/69, pulse 86, temperature 98.7 F (37.1 C), temperature source Oral, resp. rate 18, height 5\' 11"  (1.803 m), weight 79.3 kg, SpO2 97 %.  Physical Exam:  General appearance: alert, cooperative, and no distress Heart: regular rate and rhythm, S1, S2 normal, no murmur, click, rub or gallop Lungs: clear to auscultation  bilaterally Abdomen: soft, non-tender; bowel sounds normal; no masses,  no organomegaly Extremities: extremities normal, atraumatic, no cyanosis or edema Wound: clean and dry  Discharge Condition:Stable and discharged to home.  Recent laboratory studies:  Lab Results  Component Value Date   WBC 10.5 10/20/2020   HGB 11.8 (L) 10/20/2020   HCT 35.7 (L) 10/20/2020   MCV 108.2 (H) 10/20/2020   PLT 115 (L) 10/20/2020   Lab Results  Component Value Date   NA 131 (L) 10/22/2020   K 3.5 10/22/2020   CL 94 (L) 10/22/2020   CO2 27 10/22/2020   CREATININE 1.21 10/22/2020   GLUCOSE 103 (H) 10/22/2020      Diagnostic Studies: DG Chest 2 View  Result Date: 10/16/2020 CLINICAL DATA:  Preoperative examination for coronary artery bypass grafting EXAM: CHEST - 2 VIEW COMPARISON:  None. FINDINGS: Lungs are clear. Heart size and pulmonary vascularity are normal. No adenopathy. No bone lesions. There are foci of coronary artery calcification. IMPRESSION: Lungs clear. Cardiac silhouette normal. Foci of coronary calcification noted. Electronically Signed   By: 10/18/2020 III M.D.   On: 10/16/2020 20:32   CARDIAC CATHETERIZATION  Result Date: 10/05/2020  Previously placed Mid RCA stent (unknown type) is widely patent.  Prox RCA-1 lesion is 25% stenosed.  Prox RCA-2 lesion is 70% stenosed.  RPDA lesion is 70% stenosed.  Ost LM to Mid LM lesion is 80% stenosed.  2nd Mrg lesion is 50% stenosed.  Prox LAD lesion is 30% stenosed.  1. Severe diffuse 80% left main stenosis 2. Patent proximal LAD stent with mild nonobstructive LAD stenosis seen on limited angiography 3. Patent left circumflex and ramus intermedius seen on limited coronary imaging 4. Patent RCA with 60-70% proximal stenosis between 2 stents and 70% ostial PDA stenosis Recommend:  Cardiac surgical consultation for CABG  Will need to determine plan for brilinta washout in this patient who is less than 2 months out from his inferior MI   Continue anti-anginal medical therapy   DG Chest Port 1 View  Result Date: 10/21/2020 CLINICAL DATA:  Encounter for chest tube removal. EXAM: PORTABLE CHEST 1 VIEW COMPARISON:  Prior chest radiograph 10/20/2020 and earlier. FINDINGS: Prior median sternotomy. The cardiomediastinal silhouette is unchanged. Aortic atherosclerosis. Abandoned epicardial pacing wires. Interval removal of previously demonstrated bilateral chest tubes. No appreciable pneumothorax. Similar to slightly decreased irregular bibasilar pulmonary opacities. No sizable pleural effusion. No acute bony abnormality identified. IMPRESSION: Interval removal of previously demonstrated bilateral chest tubes. No evidence of pneumothorax. Irregular bibasilar pulmonary opacities are similar to slightly decreased. This may reflect atelectasis. Infection cannot be excluded. Electronically Signed   By: 10/22/2020 DO   On: 10/21/2020 10:53   DG Chest Port 1 View  Result Date: 10/20/2020 CLINICAL DATA:  Status post open heart surgery. EXAM: PORTABLE CHEST 1 VIEW COMPARISON:  Chest radiograph 10/19/2020. FINDINGS: Monitoring leads overlie the patient. Status post median sternotomy. Interval removal right IJ sheath. Stable position left chest tube. Stable position right chest tube. Interval increase in patchy  heterogeneous opacities within the right greater than left lung bases. No definite pleural effusion or pneumothorax. IMPRESSION: Increased right greater than left basilar airspace opacities which may represent atelectasis or infection. Bilateral chest tubes redemonstrated. Interval removal IJ sheath. Electronically Signed   By: Annia Belt M.D.   On: 10/20/2020 09:22   DG Chest Port 1 View  Result Date: 10/19/2020 CLINICAL DATA:  Chest tube, open heart surgery EXAM: PORTABLE CHEST 1 VIEW COMPARISON:  Portable exam 0521 hours compared to 10/18/2020 FINDINGS: Interval removal of Swan-Ganz catheter. Mediastinal drain, BILATERAL thoracostomy tubes  and RIGHT jugular Cordis catheter remain. Borderline enlargement of cardiac silhouette post median sternotomy. Atherosclerotic calcifications aorta. Bibasilar atelectasis and probable mild perihilar edema present. No pneumothorax. IMPRESSION: Bibasilar atelectasis and probable mild pulmonary edema. Electronically Signed   By: Ulyses Southward M.D.   On: 10/19/2020 08:05   DG Chest Port 1 View  Result Date: 10/18/2020 CLINICAL DATA:  Post open heart surgery, chest tubes EXAM: PORTABLE CHEST 1 VIEW COMPARISON:  Portable exam 0520 hours compared to 10/17/2020 FINDINGS: RIGHT jugular line with tip projecting over main pulmonary artery bifurcation. Mediastinal drains and BILATERAL thoracostomy tubes again identified. Interval removal of endotracheal and nasogastric tubes. Prominent heart size and mediastinal contours related to CABG. Atherosclerotic calcification aorta. Bibasilar atelectasis, increased. No pleural effusion or pneumothorax. IMPRESSION: Postoperative changes with increased bibasilar atelectasis. Electronically Signed   By: Ulyses Southward M.D.   On: 10/18/2020 08:01   DG Chest Port 1 View  Result Date: 10/17/2020 CLINICAL DATA:  Evaluate for pneumothorax, status post coronary bypass grafting EXAM: PORTABLE CHEST 1 VIEW COMPARISON:  10/16/2020 FINDINGS: Cardiac shadow is stable. Postsurgical changes are now seen. Endotracheal tube, gastric catheter and Swan-Ganz catheter are noted in satisfactory position. Bilateral thoracostomy catheters, pericardial drain and mediastinal drain are seen as well. No pneumothorax is noted. No focal infiltrate is seen. No bony abnormality is noted. IMPRESSION: Postsurgical changes with tubes and lines as described above. No pneumothorax is noted. Electronically Signed   By: Alcide Clever M.D.   On: 10/17/2020 16:18   ECHOCARDIOGRAM COMPLETE  Result Date: 10/15/2020    ECHOCARDIOGRAM REPORT   Patient Name:   AUSTIN HERD Date of Exam: 10/15/2020 Medical Rec #:  [id number]       Height:       71.0 in Accession #:    [id number]     Weight:       175.0 lb Date of Birth:  1960/09/23      BSA:          1.992 m Patient Age:    60 years       BP:           139/92 mmHg Patient Gender: M              HR:           66 bpm. Exam Location:  Inpatient Procedure: 2D Echo, Cardiac Doppler and Color Doppler Indications:    CAD of Native Vessel I25.10  History:        Patient has no prior history of Echocardiogram examinations. CAD                 and Previous Myocardial Infarction, Stroke,                 Signs/Symptoms:Dyspnea and Chest Pain; Risk Factors:Hypertension                 and Current Smoker.  Sonographer:  Tiffany Dance Referring Phys: [id number] BROADUS Z ATKINS IMPRESSIONS  1. Left ventricular ejection fraction, by estimation, is 60 to 65%. The left ventricle has normal function. The left ventricle has no regional wall motion abnormalities. There is mild left ventricular hypertrophy. Left ventricular diastolic parameters are indeterminate.  2. Right ventricular systolic function is normal. The right ventricular size is normal. Tricuspid regurgitation signal is inadequate for assessing PA pressure.  3. The mitral valve is normal in structure. Trivial mitral valve regurgitation. No evidence of mitral stenosis.  4. The aortic valve is tricuspid. Aortic valve regurgitation is not visualized. No aortic stenosis is present.  5. The inferior vena cava is normal in size with greater than 50% respiratory variability, suggesting right atrial pressure of 3 mmHg. FINDINGS  Left Ventricle: Left ventricular ejection fraction, by estimation, is 60 to 65%. The left ventricle has normal function. The left ventricle has no regional wall motion abnormalities. The left ventricular internal cavity size was normal in size. There is  mild left ventricular hypertrophy. Left ventricular diastolic parameters are indeterminate. Right Ventricle: The right ventricular size is normal. No increase in right ventricular  wall thickness. Right ventricular systolic function is normal. Tricuspid regurgitation signal is inadequate for assessing PA pressure. Left Atrium: Left atrial size was normal in size. Right Atrium: Right atrial size was normal in size. Pericardium: Trivial pericardial effusion is present. Presence of pericardial fat pad. Mitral Valve: The mitral valve is normal in structure. Trivial mitral valve regurgitation. No evidence of mitral valve stenosis. Tricuspid Valve: The tricuspid valve is normal in structure. Tricuspid valve regurgitation is not demonstrated. Aortic Valve: The aortic valve is tricuspid. Aortic valve regurgitation is not visualized. No aortic stenosis is present. Pulmonic Valve: The pulmonic valve was not well visualized. Pulmonic valve regurgitation is not visualized. Aorta: The aortic root and ascending aorta are structurally normal, with no evidence of dilitation. Venous: The inferior vena cava is normal in size with greater than 50% respiratory variability, suggesting right atrial pressure of 3 mmHg. IAS/Shunts: The interatrial septum was not well visualized.  LEFT VENTRICLE PLAX 2D LVIDd:         3.90 cm  Diastology LVIDs:         2.60 cm  LV e' medial:    5.00 cm/s LV PW:         1.20 cm  LV E/e' medial:  19.6 LV IVS:        1.00 cm  LV e' lateral:   7.51 cm/s LVOT diam:     2.20 cm  LV E/e' lateral: 13.1 LV SV:         74 LV SV Index:   37 LVOT Area:     3.80 cm  RIGHT VENTRICLE             IVC RV Basal diam:  2.40 cm     IVC diam: 1.70 cm RV S prime:     10.40 cm/s TAPSE (M-mode): 1.3 cm LEFT ATRIUM           Index       RIGHT ATRIUM           Index LA diam:      3.70 cm 1.86 cm/m  RA Area:     12.40 cm LA Vol (A2C): 49.9 ml 25.05 ml/m RA Volume:   24.50 ml  12.30 ml/m LA Vol (A4C): 20.8 ml 10.44 ml/m  AORTIC VALVE LVOT Vmax:   98.20 cm/s LVOT Vmean:  61.550 cm/s LVOT  VTI:    0.194 m  AORTA Ao Root diam: 3.60 cm Ao Asc diam:  3.50 cm MITRAL VALVE MV Area (PHT): 3.92 cm    SHUNTS MV  Decel Time: 194 msec    Systemic VTI:  0.19 m MV E velocity: 98.20 cm/s  Systemic Diam: 2.20 cm MV A velocity: 73.75 cm/s MV E/A ratio:  1.33 Epifanio Lesches MD Electronically signed by Epifanio Lesches MD Signature Date/Time: 10/15/2020/6:54:47 PM    Final    ECHO INTRAOPERATIVE TEE  Result Date: 10/17/2020  *INTRAOPERATIVE TRANSESOPHAGEAL REPORT *  Patient Name:   [name] Date of Exam: 10/17/2020 Medical Rec #:  [id number]      Height:       71.0 in Accession #:    [id number]     Weight:       174.6 lb Date of Birth:  1961-03-04      BSA:          1.99 m Patient Age:    60 years       BP:           120/65 mmHg Patient Gender: M              HR:           60 bpm. Exam Location:  Anesthesiology Transesophogeal exam was perform intraoperatively during surgical procedure. Patient was closely monitored under general anesthesia during the entirety of examination. Indications:     I25.110 Atherosclerotic heart disease of native coronary artery                  with unstable angina pectoris Sonographer:     Eulah Pont RDCS Performing Phys: Leslye Peer MD Diagnosing Phys: Leslye Peer MD Complications: No known complications during this procedure. POST-OP IMPRESSIONS - Left Ventricle: The left ventricle is unchanged from pre-bypass. - Right Ventricle: The right ventricle appears unchanged from pre-bypass. - Aorta: The aorta appears unchanged from pre-bypass. - Left Atrium: The left atrium appears unchanged from pre-bypass. - Left Atrial Appendage: The left atrial appendage appears unchanged from pre-bypass. - Aortic Valve: The aortic valve appears unchanged from pre-bypass. - Mitral Valve: The mitral valve appears unchanged from pre-bypass. - Tricuspid Valve: The tricuspid valve appears unchanged from pre-bypass. - Interatrial Septum: The interatrial septum appears unchanged from pre-bypass. - Interventricular Septum: The interventricular septum appears unchanged from pre-bypass. - Pericardium: The  pericardium appears unchanged from pre-bypass. PRE-OP FINDINGS  Left Ventricle: The left ventricle has normal systolic function, with an ejection fraction of 55-60%. The cavity size was normal. There is mild concentric left ventricular hypertrophy. No evidence of left ventricular regional wall motion abnormalities. There is mild concentric left ventricular hypertrophy. Right Ventricle: The right ventricle has normal systolic function. The cavity was normal. There is no increase in right ventricular wall thickness. Left Atrium: Left atrial size was normal in size. No left atrial/left atrial appendage thrombus was detected. Right Atrium: Right atrial size was normal in size. Interatrial Septum: No atrial level shunt detected by color flow Doppler. Pericardium: There is no evidence of pericardial effusion. Mitral Valve: The mitral valve is normal in structure. Mitral valve regurgitation is not visualized by color flow Doppler. There is No evidence of mitral stenosis. Tricuspid Valve: The tricuspid valve was not assessed. Tricuspid valve regurgitation was not visualized by color flow Doppler. No evidence of tricuspid stenosis is present. Aortic Valve: The aortic valve is tricuspid Aortic valve regurgitation was not visualized by color flow Doppler. There is  no stenosis of the aortic valve. Pulmonic Valve: The pulmonic valve was not assessed. Pulmonic valve regurgitation was not assessed by color flow Doppler. Aorta: The aortic root, ascending aorta and aortic arch are normal in size and structure. There is evidence of plaque in the descending aorta; Grade III, measuring 3-465mm in size. Pulmonary Artery: The pulmonary artery is not well seen.  Leslye Peerhomas Brock MD Electronically signed by Leslye Peerhomas Brock MD Signature Date/Time: 10/17/2020/2:57:50 PM    Final    VAS US DOPPLER PRE CABG  Result Date: 10/17/2020 PREOPERATIVE VASCULAR EVALUATION Patient Name:  Wendall PapaFraser A Conrad  Date of Exam:   10/16/2020 Medical Rec #: [id number]        Accession #:    [id number] Date of Birth: 1960/12/13       Patient Gender: M Patient Age:   60Y [facility] Procedure:      VAS US DOPPLER PRE CABG Referring Phys: [id number] BROADUS Z ATKINS --------------------------------------------------------------------------------  Indications:            Pre-CABG. Risk Factors:           Hypertension, hyperlipidemia. Vascular Interventions: 09/13/2020 - TRANSCAROTID ARTERY REVASCULARIZATION LEFT. Comparison Study:       08/22/2020 - Carotid artery duplex                         Right Carotid: Velocities in the right ICA are                         consistent with a 1-39% stenosis.                         Left Carotid: Velocities in the left ICA are consistent                         with a 60-79% stenosis.                         Vertebrals: Bilateral vertebral arteries demonstrate                         antegrade flow. Performing Technologist: Olen Cordialollins, Greg RVT  Examination Guidelines: A complete evaluation includes B-mode imaging, spectral Doppler, color Doppler, and power Doppler as needed of all accessible portions of each vessel. Bilateral testing is considered an integral part of a complete examination. Limited examinations for reoccurring indications may be performed as noted.  Right Carotid Findings: +----------+--------+--------+--------+-----------------------+--------+           PSV cm/sEDV cm/sStenosisDescribe               Comments +----------+--------+--------+--------+-----------------------+--------+ CCA Prox  91      23              smooth and heterogenous         +----------+--------+--------+--------+-----------------------+--------+ CCA Distal76      23              smooth and heterogenous         +----------+--------+--------+--------+-----------------------+--------+ ICA Prox  47      19              smooth and heterogenous         +----------+--------+--------+--------+-----------------------+--------+  ICA Distal80      34                                              +----------+--------+--------+--------+-----------------------+--------+  ECA       70      14                                              +----------+--------+--------+--------+-----------------------+--------+ Portions of this table do not appear on this page. +----------+--------+-------+--------+------------+           PSV cm/sEDV cmsDescribeArm Pressure +----------+--------+-------+--------+------------+ Subclavian100                                 +----------+--------+-------+--------+------------+ +---------+--------+--+--------+-+---------+ VertebralPSV cm/s17EDV cm/s3Antegrade +---------+--------+--+--------+-+---------+ Left Carotid Findings: +----------+--------+--------+--------+-----------------------+--------+           PSV cm/sEDV cm/sStenosisDescribe               Comments +----------+--------+--------+--------+-----------------------+--------+ CCA Prox  94      24              smooth and heterogenous         +----------+--------+--------+--------+-----------------------+--------+ CCA Distal78      21              smooth and heterogenous         +----------+--------+--------+--------+-----------------------+--------+ ICA Prox  56      18                                     Stent    +----------+--------+--------+--------+-----------------------+--------+ ICA Mid   85      33                                     Stent    +----------+--------+--------+--------+-----------------------+--------+ ICA Distal75      30                                              +----------+--------+--------+--------+-----------------------+--------+ ECA       51      13                                              +----------+--------+--------+--------+-----------------------+--------+ +----------+--------+--------+--------+------------+ SubclavianPSV cm/sEDV  cm/sDescribeArm Pressure +----------+--------+--------+--------+------------+           66                                   +----------+--------+--------+--------+------------+ +---------+--------+--+--------+--+---------+ VertebralPSV cm/s97EDV cm/s33Antegrade +---------+--------+--+--------+--+---------+  ABI Findings: +--------+------------------+-----+---------+--------+ Right   Rt Pressure (mmHg)IndexWaveform Comment  +--------+------------------+-----+---------+--------+ [id number]                    triphasic         +--------+------------------+-----+---------+--------+ PTA     145               1.06 triphasic         +--------+------------------+-----+---------+--------+ DP      129               0.94 biphasic          +--------+------------------+-----+---------+--------+ +--------+------------------+-----+---------+-------+  Left    Lt Pressure (mmHg)IndexWaveform Comment +--------+------------------+-----+---------+-------+ [id number]                    triphasic        +--------+------------------+-----+---------+-------+ PTA     140               1.02 triphasic        +--------+------------------+-----+---------+-------+ DP      150               1.09 triphasic        +--------+------------------+-----+---------+-------+ +-------+---------------+----------------+ ABI/TBIToday's ABI/TBIPrevious ABI/TBI +-------+---------------+----------------+ Right  1.06                            +-------+---------------+----------------+ Left   1.09                            +-------+---------------+----------------+  Right Doppler Findings: +--------+--------+-----+---------+--------+ Site    PressureIndexDoppler  Comments +--------+--------+-----+---------+--------+ [id number]          triphasic         +--------+--------+-----+---------+--------+ Radial               triphasic          +--------+--------+-----+---------+--------+ Ulnar                triphasic         +--------+--------+-----+---------+--------+  Left Doppler Findings: +--------+--------+-----+---------+--------+ Site    PressureIndexDoppler  Comments +--------+--------+-----+---------+--------+ [id number]          triphasic         +--------+--------+-----+---------+--------+ Radial               triphasic         +--------+--------+-----+---------+--------+ Ulnar                triphasic         +--------+--------+-----+---------+--------+  Summary: Right Carotid: Velocities in the right ICA are consistent with a 1-39% stenosis. Left Carotid: Less than fifty percent stenosis in the presence of an ICA stent. Vertebrals: Bilateral vertebral arteries demonstrate antegrade flow. Right ABI: Resting right ankle-brachial index is within normal range. No evidence of significant right lower extremity arterial disease. Left ABI: Resting left ankle-brachial index is within normal range. No evidence of significant left lower extremity arterial disease. Right Upper Extremity: Doppler waveform obliterate with right radial compression. Doppler waveform obliterate with right ulnar compression. Left Upper Extremity: Doppler waveforms remain within normal limits with left radial compression. Doppler waveforms decrease >50% with left ulnar compression.  Electronically signed by Fabienne Bruns MD on 10/17/2020 at 8:47:09 AM.    Final      Discharge Medications: Allergies as of 10/22/2020       Reactions   Atorvastatin    Swelling  "blew up" all over, hospitalized (2018 in [place])    Rosuvastatin Diarrhea   Crestor [place]07[place]2020 [place]   Pt stopped taking ~ end of April 2022,  diarrhea resolved ~36 hours later.  Cardiologist Dr. Royann Shivers is aware he is not taking and will re-address lipid treatment after vascular procedures completed.         Medication List     STOP taking  these medications    Brilinta 90 MG Tabs tablet Generic drug: ticagrelor   nitroGLYCERIN 0.4 MG SL tablet Commonly known as: NITROSTAT   nitroGLYCERIN 0.4 MG/SPRAY spray Commonly known as: NITROLINGUAL  oxyCODONE-acetaminophen 5-325 MG tablet Commonly known as: PERCOCET/ROXICET       TAKE these medications    ALPRAZolam 1 MG tablet Commonly known as: Xanax Take 1 tablet (1 mg total) by mouth every 8 (eight) hours as needed for anxiety.   alum & mag hydroxide-simeth 200-200-20 MG/5ML suspension Commonly known as: MAALOX/MYLANTA Take 30 mLs by mouth every 6 (six) hours as needed for indigestion or heartburn.   aspirin EC 81 MG tablet Take 1 tablet (81 mg total) by mouth daily. Swallow whole.   Centrum Silver 50+Men Tabs Take 1 tablet by mouth daily.   clopidogrel 75 MG tablet Commonly known as: PLAVIX Take 1 tablet (75 mg total) by mouth daily.   esomeprazole 20 MG capsule Commonly known as: NEXIUM Take 20 mg by mouth daily at 12 noon.   guaiFENesin 600 MG 12 hr tablet Commonly known as: Mucinex Take 2 tablets (1,200 mg total) by mouth 2 (two) times daily for 7 days.   metoprolol tartrate 25 MG tablet Commonly known as: LOPRESSOR Take 0.5 tablets (12.5 mg total) by mouth 2 (two) times daily. What changed:  medication strength how much to take   nicotine 21 mg/24hr patch Commonly known as: NICODERM CQ - dosed in mg/24 hours Place 1 patch (21 mg total) onto the skin daily.   oxyCODONE 5 MG immediate release tablet Commonly known as: Oxy IR/ROXICODONE Take 1 tablet (5 mg total) by mouth every 6 (six) hours as needed for severe pain.               Durable Medical Equipment  (From admission, onward)           Start     Ordered   10/22/20 0905  For home use only DME oxygen  Once       Question Answer Comment  Length of Need 6 Months   Mode or (Route) Nasal cannula   Liters per Minute 2   Frequency Continuous (stationary and portable oxygen  unit needed)   Oxygen conserving device Yes   Oxygen delivery system Gas      10/22/20 0906           The patient has been discharged on:   1.Beta Blocker:  Yes [  yes]                              No   [   ]                              If No, reason:  2.Ace Inhibitor/ARB: Yes [   ]                                     No  [  no  ]                                     If No, reason: BP low normal  3.Statin:   Yes [   ]                  No  [  no ]                  If No,  reason: statin allergy  4.Marlowe Kays:  Yes  [ yes ]                  No   [   ]                  If No, reason:    Follow Up Appointments:  Follow-up Information     Croitoru, Mihai, MD Follow up.   Specialty: Cardiology Why: Hospital follow-up with Cardiology scheduled for 11/15/2020 at 8:00am. Please arrive 15 minutes early for check-in. Contact information: [address] [place] [address] [phone]         Linden Dolin, MD. Go on 11/15/2020.   Specialty: Cardiothoracic Surgery Why: Appointment time is 11:00 am Contact information: [address] [place] [address] [phone]         Christell Constant, MD. Call today.   Specialty: Cardiology Contact information: [address] [place] [address] [place] [address] [phone]                 Signed: Bernadette Hoit ContePA-C 10/22/2020, 9:09 AM

## 2020-10-18 NOTE — Progress Notes (Signed)
RT instructed patient on the use of a flutter valve. Patient was able to demonstrate back good technique. 

## 2020-10-19 ENCOUNTER — Inpatient Hospital Stay (HOSPITAL_COMMUNITY): Payer: Managed Care, Other (non HMO)

## 2020-10-19 LAB — GLUCOSE, CAPILLARY
Glucose-Capillary: 100 mg/dL — ABNORMAL HIGH (ref 70–99)
Glucose-Capillary: 103 mg/dL — ABNORMAL HIGH (ref 70–99)
Glucose-Capillary: 112 mg/dL — ABNORMAL HIGH (ref 70–99)
Glucose-Capillary: 116 mg/dL — ABNORMAL HIGH (ref 70–99)
Glucose-Capillary: 126 mg/dL — ABNORMAL HIGH (ref 70–99)
Glucose-Capillary: 128 mg/dL — ABNORMAL HIGH (ref 70–99)

## 2020-10-19 LAB — COOXEMETRY PANEL
Carboxyhemoglobin: 1.5 % (ref 0.5–1.5)
Methemoglobin: 0.7 % (ref 0.0–1.5)
O2 Saturation: 76.3 %
Total hemoglobin: 8.1 g/dL — ABNORMAL LOW (ref 12.0–16.0)

## 2020-10-19 LAB — CBC
HCT: 29.8 % — ABNORMAL LOW (ref 39.0–52.0)
Hemoglobin: 9.8 g/dL — ABNORMAL LOW (ref 13.0–17.0)
MCH: 36 pg — ABNORMAL HIGH (ref 26.0–34.0)
MCHC: 32.9 g/dL (ref 30.0–36.0)
MCV: 109.6 fL — ABNORMAL HIGH (ref 80.0–100.0)
Platelets: 89 10*3/uL — ABNORMAL LOW (ref 150–400)
RBC: 2.72 MIL/uL — ABNORMAL LOW (ref 4.22–5.81)
RDW: 15 % (ref 11.5–15.5)
WBC: 11.4 10*3/uL — ABNORMAL HIGH (ref 4.0–10.5)
nRBC: 0 % (ref 0.0–0.2)

## 2020-10-19 LAB — BASIC METABOLIC PANEL
Anion gap: 9 (ref 5–15)
BUN: 6 mg/dL (ref 6–20)
CO2: 22 mmol/L (ref 22–32)
Calcium: 7.6 mg/dL — ABNORMAL LOW (ref 8.9–10.3)
Chloride: 105 mmol/L (ref 98–111)
Creatinine, Ser: 0.95 mg/dL (ref 0.61–1.24)
GFR, Estimated: >60 mL/min (ref >60)
Glucose, Bld: 108 mg/dL — ABNORMAL HIGH (ref 70–99)
Potassium: 3.7 mmol/L (ref 3.5–5.1)
Sodium: 136 mmol/L (ref 135–145)

## 2020-10-19 MED ORDER — ALPRAZOLAM 1 MG PO TABS: 1.0000 mg | ORAL_TABLET | Freq: Three times a day (TID) | ORAL | Status: DC | PRN

## 2020-10-19 MED ORDER — POTASSIUM CHLORIDE CRYS ER 20 MEQ PO TBCR
20.0000 meq | EXTENDED_RELEASE_TABLET | Freq: Two times a day (BID) | ORAL | Status: DC
  Administered 2020-10-19 – 2020-10-20 (×3): 20 meq via ORAL
  Filled 2020-10-19 (×4): qty 1

## 2020-10-19 MED ORDER — POTASSIUM CHLORIDE CRYS ER 20 MEQ PO TBCR
20.0000 meq | EXTENDED_RELEASE_TABLET | ORAL | Status: AC
  Administered 2020-10-19 (×3): 20 meq via ORAL
  Filled 2020-10-19 (×3): qty 1

## 2020-10-19 MED ORDER — FUROSEMIDE 10 MG/ML IJ SOLN
60.0000 mg | Freq: Two times a day (BID) | INTRAMUSCULAR | Status: DC
  Administered 2020-10-19 – 2020-10-21 (×5): 60 mg via INTRAVENOUS
  Filled 2020-10-19 (×5): qty 6

## 2020-10-19 MED ORDER — ALUM & MAG HYDROXIDE-SIMETH 200-200-20 MG/5ML PO SUSP
30.0000 mL | Freq: Four times a day (QID) | ORAL | Status: DC | PRN
  Administered 2020-10-19 – 2020-10-20 (×2): 30 mL via ORAL
  Filled 2020-10-19 (×2): qty 30

## 2020-10-19 MED ORDER — FUROSEMIDE 10 MG/ML IJ SOLN: 40.0000 mg | Freq: Two times a day (BID) | INTRAMUSCULAR | Status: DC

## 2020-10-19 NOTE — Progress Notes (Signed)
Patient transferred from [facility] to [facility] 09 in stable condition but visibly in pain.  Settled into bed, admitted to monitor and room, vital signs obtained and pain medication provided. Report given to oncoming night nurse.

## 2020-10-19 NOTE — Progress Notes (Signed)
2 Days Post-Op Procedure(s) (LRB): CORONARY ARTERY BYPASS GRAFTING (CABG)x 4 USING BILATERAL INTERNAL MAMMARIES,  LEFT RADIAL ARTERY, AND RIGHT GSV, ON CARDIOPULMONARY BYPASS. (N/A) TRANSESOPHAGEAL ECHOCARDIOGRAM (TEE) (N/A) INDOCYANINE GREEN FLUORESCENCE IMAGING (ICG) (N/A) RADIAL ARTERY HARVEST (Left) VEIN HARVEST OF RIGHT GREATER SAPHENOUS VEIN (Right) APPLICATION OF CELL SAVER (N/A) Subjective: Mild incisional pain  Objective: Vital signs in last 24 hours: Temp:  [97.8 F (36.6 C)-100.4 F (38 C)] 97.8 F (36.6 C) (06/17 0645) Pulse Rate:  [76-101] 89 (06/17 0700) Cardiac Rhythm: Normal sinus rhythm (06/17 0400) Resp:  [15-41] 30 (06/17 0700) BP: (70-131)/(53-87) 106/87 (06/17 0700) SpO2:  [90 %-100 %] 93 % (06/17 0700) Arterial Line BP: (83-263)/(54-245) 89/83 (06/16 1500) Weight:  [85.4 kg] 85.4 kg (06/17 0624)  Hemodynamic parameters for last 24 hours: PAP: (26-56)/(13-21) 40/17  Intake/Output from previous day: 06/16 0701 - 06/17 0700 In: 1788.4 [I.V.:1177.6; IV Piggyback:310.9] Out: 2545 [Urine:1820; Chest Tube:725] Intake/Output this shift: No intake/output data recorded.  General appearance: alert and cooperative Neurologic: intact Heart: regular rate and rhythm, S1, S2 normal, no murmur, click, rub or gallop Lungs: clear to auscultation bilaterally Abdomen: soft, non-tender; bowel sounds normal; no masses,  no organomegaly Extremities: extremities normal, atraumatic, no cyanosis or edema Wound: c/d/i  Lab Results: Recent Labs    10/18/20 1615 10/19/20 0335  WBC 13.1* 11.4*  HGB 10.6* 9.8*  HCT 31.7* 29.8*  PLT 93* 89*   BMET:  Recent Labs    10/18/20 1615 10/19/20 0335  NA 134* 136  K 3.8 3.7  CL 103 105  CO2 21* 22  GLUCOSE 105* 108*  BUN 6 6  CREATININE 0.87 0.95  CALCIUM 7.3* 7.6*    PT/INR:  Recent Labs    10/17/20 1559  LABPROT 15.1  INR 1.2   ABG    Component Value Date/Time   PHART 7.400 10/18/2020 0500   HCO3 22.3  10/18/2020 0500   TCO2 23 10/18/2020 0500   ACIDBASEDEF 2.0 10/18/2020 0500   O2SAT 76.3 10/19/2020 0445   CBG (last 3)  Recent Labs    10/18/20 2313 10/19/20 0337 10/19/20 0646  GLUCAP 122* 103* 112*    Assessment/Plan: S/P Procedure(s) (LRB): CORONARY ARTERY BYPASS GRAFTING (CABG)x 4 USING BILATERAL INTERNAL MAMMARIES,  LEFT RADIAL ARTERY, AND RIGHT GSV, ON CARDIOPULMONARY BYPASS. (N/A) TRANSESOPHAGEAL ECHOCARDIOGRAM (TEE) (N/A) INDOCYANINE GREEN FLUORESCENCE IMAGING (ICG) (N/A) RADIAL ARTERY HARVEST (Left) VEIN HARVEST OF RIGHT GREATER SAPHENOUS VEIN (Right) APPLICATION OF CELL SAVER (N/A) Mobilize Diuresis D/c milrinone Hold isosorbide and beta blocker this am May be able to remove tubes, transfer to tele later today   LOS: 4 days    Linden Dolin 10/19/2020

## 2020-10-19 NOTE — Addendum Note (Signed)
Addendum  created 10/19/20 0743 by Adair Laundry, CRNA   Clinical Note Signed

## 2020-10-19 NOTE — Progress Notes (Signed)
[facility] BRIEF SICU PROGRESS NOTE  2 Days Post-Op  S/P Procedure(s) (LRB): CORONARY ARTERY BYPASS GRAFTING (CABG)x 4 USING BILATERAL INTERNAL MAMMARIES,  LEFT RADIAL ARTERY, AND RIGHT GSV, ON CARDIOPULMONARY BYPASS. (N/A) TRANSESOPHAGEAL ECHOCARDIOGRAM (TEE) (N/A) INDOCYANINE GREEN FLUORESCENCE IMAGING (ICG) (N/A) RADIAL ARTERY HARVEST (Left) VEIN HARVEST OF RIGHT GREATER SAPHENOUS VEIN (Right) APPLICATION OF CELL SAVER (N/A)   Stable day NSR w/ stable BP Breathing comfortably on 2 L/min UOP adequate  Plan: Continue current plan  Purcell Nails, MD 10/19/2020 5:58 PM

## 2020-10-19 NOTE — Plan of Care (Signed)

## 2020-10-20 ENCOUNTER — Inpatient Hospital Stay (HOSPITAL_COMMUNITY): Payer: Managed Care, Other (non HMO)

## 2020-10-20 ENCOUNTER — Encounter (HOSPITAL_COMMUNITY): Payer: Self-pay | Admitting: Cardiothoracic Surgery

## 2020-10-20 LAB — GLUCOSE, CAPILLARY
Glucose-Capillary: 118 mg/dL — ABNORMAL HIGH (ref 70–99)
Glucose-Capillary: 115 mg/dL — ABNORMAL HIGH (ref 70–99)
Glucose-Capillary: 124 mg/dL — ABNORMAL HIGH (ref 70–99)
Glucose-Capillary: 108 mg/dL — ABNORMAL HIGH (ref 70–99)
Glucose-Capillary: 121 mg/dL — ABNORMAL HIGH (ref 70–99)

## 2020-10-20 LAB — BASIC METABOLIC PANEL
Anion gap: 8 (ref 5–15)
BUN: 8 mg/dL (ref 6–20)
CO2: 27 mmol/L (ref 22–32)
Calcium: 8.5 mg/dL — ABNORMAL LOW (ref 8.9–10.3)
Chloride: 100 mmol/L (ref 98–111)
Creatinine, Ser: 1.11 mg/dL (ref 0.61–1.24)
GFR, Estimated: >60 mL/min (ref >60)
Glucose, Bld: 106 mg/dL — ABNORMAL HIGH (ref 70–99)
Potassium: 4.3 mmol/L (ref 3.5–5.1)
Sodium: 135 mmol/L (ref 135–145)

## 2020-10-20 LAB — CBC
HCT: 35.7 % — ABNORMAL LOW (ref 39.0–52.0)
Hemoglobin: 11.8 g/dL — ABNORMAL LOW (ref 13.0–17.0)
MCH: 35.8 pg — ABNORMAL HIGH (ref 26.0–34.0)
MCHC: 33.1 g/dL (ref 30.0–36.0)
MCV: 108.2 fL — ABNORMAL HIGH (ref 80.0–100.0)
Platelets: 115 10*3/uL — ABNORMAL LOW (ref 150–400)
RBC: 3.3 MIL/uL — ABNORMAL LOW (ref 4.22–5.81)
RDW: 14.6 % (ref 11.5–15.5)
WBC: 10.5 10*3/uL (ref 4.0–10.5)
nRBC: 0 % (ref 0.0–0.2)

## 2020-10-20 MED ORDER — DM-GUAIFENESIN ER 30-600 MG PO TB12
1.0000 | ORAL_TABLET | Freq: Two times a day (BID) | ORAL | Status: DC
  Administered 2020-10-20 – 2020-10-22 (×5): 1 via ORAL
  Filled 2020-10-20 (×5): qty 1

## 2020-10-20 MED ORDER — GUAIFENESIN ER 600 MG PO TB12: 1200.0000 mg | ORAL_TABLET | Freq: Two times a day (BID) | ORAL | Status: DC

## 2020-10-20 NOTE — Progress Notes (Signed)
Patient has four chest tubes post op. At 2440, all four chest tubes pulled without issue. Four incisions closed with stay sutures, triple knotted. Gauze and tape applied across incision sites. Patient tolerated well.

## 2020-10-20 NOTE — Plan of Care (Signed)
  Problem: Education: Goal: Knowledge of General Education information will improve Description: Including pain rating scale, medication(s)/side effects and non-pharmacologic comfort measures Outcome: Progressing   Problem: Health Behavior/Discharge Planning: Goal: Ability to manage health-related needs will improve Outcome: Progressing   Problem: Clinical Measurements: Goal: Ability to maintain clinical measurements within normal limits will improve Outcome: Progressing Goal: Will remain free from infection Outcome: Progressing Goal: Cardiovascular complication will be avoided Outcome: Progressing   Problem: Activity: Goal: Risk for activity intolerance will decrease Outcome: Progressing   Problem: Elimination: Goal: Will not experience complications related to bowel motility Outcome: Progressing Goal: Will not experience complications related to urinary retention Outcome: Progressing   Problem: Pain Managment: Goal: General experience of comfort will improve Outcome: Progressing   Problem: Skin Integrity: Goal: Risk for impaired skin integrity will decrease Outcome: Progressing

## 2020-10-20 NOTE — Progress Notes (Signed)
Mobility Specialist - Progress Note   10/20/20 1037  Mobility  Activity Contraindicated/medical hold   Instructed by RN not to see pt d/t pt being in pain this am. Will f/u as able.   Mamie Levers Mobility Specialist Mobility Specialist Phone: [phone]

## 2020-10-20 NOTE — Progress Notes (Addendum)
[address]       [place] [address]             [phone]        3 Days Post-Op Procedure(s) (LRB): CORONARY ARTERY BYPASS GRAFTING (CABG)x 4 USING BILATERAL INTERNAL MAMMARIES,  LEFT RADIAL ARTERY, AND RIGHT GSV, ON CARDIOPULMONARY BYPASS. (N/A) TRANSESOPHAGEAL ECHOCARDIOGRAM (TEE) (N/A) INDOCYANINE GREEN FLUORESCENCE IMAGING (ICG) (N/A) RADIAL ARTERY HARVEST (Left) VEIN HARVEST OF RIGHT GREATER SAPHENOUS VEIN (Right) APPLICATION OF CELL SAVER (N/A) Subjective: Having issues taking a deep breath, states the IS is hard to use and makes him cough.   Objective: Vital signs in last 24 hours: Temp:  [98.1 F (36.7 C)-98.4 F (36.9 C)] 98.1 F (36.7 C) (06/18 0610) Pulse Rate:  [83-100] 96 (06/18 0912) Cardiac Rhythm: Normal sinus rhythm (06/18 0703) Resp:  [17-39] 24 (06/18 0854) BP: (93-118)/(66-88) 100/73 (06/18 0912) SpO2:  [93 %-99 %] 96 % (06/18 0854) FiO2 (%):  [28 %] 28 % (06/18 0854)     Intake/Output from previous day: 06/17 0701 - 06/18 0700 In: 1008.7 [P.O.:720; I.V.:188.7; IV Piggyback:100] Out: 4185 [Urine:3975; Chest Tube:210] Intake/Output this shift: Total I/O In: -  Out: 1070 [Urine:1050; Chest Tube:20]  General appearance: alert, cooperative, and no distress Heart: regular rate and rhythm, S1, S2 normal, no murmur, click, rub or gallop Lungs: diffuse rhonchi in both fields Abdomen: soft, non-tender; bowel sounds normal; no masses,  no organomegaly Extremities: extremities normal, atraumatic, no cyanosis or edema Wound: clean and dry  Lab Results: Recent Labs    10/19/20 0335 10/20/20 0022  WBC 11.4* 10.5  HGB 9.8* 11.8*  HCT 29.8* 35.7*  PLT 89* 115*   BMET:  Recent Labs    10/19/20 0335 10/20/20 0022  NA 136 135  K 3.7 4.3  CL 105 100  CO2 22 27  GLUCOSE 108* 106*  BUN 6 8  CREATININE 0.95 1.11  CALCIUM 7.6* 8.5*    PT/INR:  Recent Labs    10/17/20 1559  LABPROT 15.1  INR 1.2   ABG     Component Value Date/Time   PHART 7.400 10/18/2020 0500   HCO3 22.3 10/18/2020 0500   TCO2 23 10/18/2020 0500   ACIDBASEDEF 2.0 10/18/2020 0500   O2SAT 76.3 10/19/2020 0445   CBG (last 3)  Recent Labs    10/19/20 2356 10/20/20 0326 10/20/20 0803  GLUCAP 116* 118* 115*    Assessment/Plan: S/P Procedure(s) (LRB): CORONARY ARTERY BYPASS GRAFTING (CABG)x 4 USING BILATERAL INTERNAL MAMMARIES,  LEFT RADIAL ARTERY, AND RIGHT GSV, ON CARDIOPULMONARY BYPASS. (N/A) TRANSESOPHAGEAL ECHOCARDIOGRAM (TEE) (N/A) INDOCYANINE GREEN FLUORESCENCE IMAGING (ICG) (N/A) RADIAL ARTERY HARVEST (Left) VEIN HARVEST OF RIGHT GREATER SAPHENOUS VEIN (Right) APPLICATION OF CELL SAVER (N/A)  CV-NSR in the 80s, BP well controlled Pulm- tolerating 2.5L [place]. Continue IV lasix for fluid overload. Will add a flutter valve for increased atelectasis on CXR. Not much chest tube drainage. Add mucinex.  Renal-creatinine 1.11, potassium 4.3, on 60mg  IV of lasix for fluid overload. No recent weight measurements. He needs daily weights. > 1000 urine output. H and H 11.8/35.7, stable Endo-blood glucose well controlled  Plan: current smoker and needs aggressive pulm toilet. Encouraged flutter valve and IS. Chest  tubes removed today and OOB to chair. Mucinex ordered. Needs to ambulate today x2.    LOS: 5 days    01-29-1981 10/20/2020   I have seen and examined the patient and agree with the assessment and plan as  outlined.  Purcell Nails, MD 10/20/2020 5:01 PM

## 2020-10-21 ENCOUNTER — Inpatient Hospital Stay (HOSPITAL_COMMUNITY): Payer: Managed Care, Other (non HMO)

## 2020-10-21 LAB — GLUCOSE, CAPILLARY
Glucose-Capillary: 105 mg/dL — ABNORMAL HIGH (ref 70–99)
Glucose-Capillary: 111 mg/dL — ABNORMAL HIGH (ref 70–99)
Glucose-Capillary: 111 mg/dL — ABNORMAL HIGH (ref 70–99)
Glucose-Capillary: 117 mg/dL — ABNORMAL HIGH (ref 70–99)
Glucose-Capillary: 131 mg/dL — ABNORMAL HIGH (ref 70–99)
Glucose-Capillary: 148 mg/dL — ABNORMAL HIGH (ref 70–99)

## 2020-10-21 LAB — BASIC METABOLIC PANEL
Anion gap: 9 (ref 5–15)
BUN: 14 mg/dL (ref 6–20)
CO2: 25 mmol/L (ref 22–32)
Calcium: 8.4 mg/dL — ABNORMAL LOW (ref 8.9–10.3)
Chloride: 98 mmol/L (ref 98–111)
Creatinine, Ser: 1.19 mg/dL (ref 0.61–1.24)
GFR, Estimated: >60 mL/min (ref >60)
Glucose, Bld: 111 mg/dL — ABNORMAL HIGH (ref 70–99)
Potassium: 4.1 mmol/L (ref 3.5–5.1)
Sodium: 132 mmol/L — ABNORMAL LOW (ref 135–145)

## 2020-10-21 MED ORDER — LEVALBUTEROL HCL 0.63 MG/3ML IN NEBU: 0.6300 mg | INHALATION_SOLUTION | Freq: Four times a day (QID) | RESPIRATORY_TRACT | Status: DC | PRN

## 2020-10-21 MED ORDER — FUROSEMIDE 40 MG PO TABS
40.0000 mg | ORAL_TABLET | Freq: Every day | ORAL | Status: DC
  Administered 2020-10-21 – 2020-10-22 (×2): 40 mg via ORAL
  Filled 2020-10-21 (×3): qty 1

## 2020-10-21 MED ORDER — POTASSIUM CHLORIDE CRYS ER 20 MEQ PO TBCR
20.0000 meq | EXTENDED_RELEASE_TABLET | Freq: Every day | ORAL | Status: DC
  Administered 2020-10-22: 20 meq via ORAL
  Filled 2020-10-21: qty 1

## 2020-10-21 NOTE — Progress Notes (Addendum)
      [address]       [place] [address]             [phone]      4 Days Post-Op Procedure(s) (LRB): CORONARY ARTERY BYPASS GRAFTING (CABG)x 4 USING BILATERAL INTERNAL MAMMARIES,  LEFT RADIAL ARTERY, AND RIGHT GSV, ON CARDIOPULMONARY BYPASS. (N/A) TRANSESOPHAGEAL ECHOCARDIOGRAM (TEE) (N/A) INDOCYANINE GREEN FLUORESCENCE IMAGING (ICG) (N/A) RADIAL ARTERY HARVEST (Left) VEIN HARVEST OF RIGHT GREATER SAPHENOUS VEIN (Right) APPLICATION OF CELL SAVER (N/A) Subjective: He feels like his breathing is better today. Lots of secretions yesterday but have slowed down some today   Objective: Vital signs in last 24 hours: Temp:  [98.4 F (36.9 C)-99.1 F (37.3 C)] 98.7 F (37.1 C) (06/19 0753) Pulse Rate:  [88-100] 89 (06/19 0753) Cardiac Rhythm: Normal sinus rhythm (06/19 0707) Resp:  [18-21] 19 (06/19 0753) BP: (94-109)/(76-85) 105/84 (06/19 0753) SpO2:  [90 %-94 %] 94 % (06/19 0753) Weight:  [79.3 kg] 79.3 kg (06/19 0528)     Intake/Output from previous day: 06/18 0701 - 06/19 0700 In: -  Out: 2015 [Urine:1975; Chest Tube:40] Intake/Output this shift: Total I/O In: 120 [P.O.:120] Out: -   General appearance: alert, cooperative, and no distress Heart: regular rate and rhythm, S1, S2 normal, no murmur, click, rub or gallop Lungs: clear to auscultation bilaterally Abdomen: soft, non-tender; bowel sounds normal; no masses,  no organomegaly Extremities: extremities normal, atraumatic, no cyanosis or edema Wound: clean and dry  Lab Results: Recent Labs    10/19/20 0335 10/20/20 0022  WBC 11.4* 10.5  HGB 9.8* 11.8*  HCT 29.8* 35.7*  PLT 89* 115*   BMET:  Recent Labs    10/20/20 0022 10/21/20 0056  NA 135 132*  K 4.3 4.1  CL 100 98  CO2 27 25  GLUCOSE 106* 111*  BUN 8 14  CREATININE 1.11 1.19  CALCIUM 8.5* 8.4*    PT/INR: No results for input(s): LABPROT, INR in the last 72 hours. ABG    Component Value Date/Time   PHART 7.400  10/18/2020 0500   HCO3 22.3 10/18/2020 0500   TCO2 23 10/18/2020 0500   ACIDBASEDEF 2.0 10/18/2020 0500   O2SAT 76.3 10/19/2020 0445   CBG (last 3)  Recent Labs    10/20/20 2359 10/21/20 0406 10/21/20 0748  GLUCAP 105* 111* 148*    Assessment/Plan: S/P Procedure(s) (LRB): CORONARY ARTERY BYPASS GRAFTING (CABG)x 4 USING BILATERAL INTERNAL MAMMARIES,  LEFT RADIAL ARTERY, AND RIGHT GSV, ON CARDIOPULMONARY BYPASS. (N/A) TRANSESOPHAGEAL ECHOCARDIOGRAM (TEE) (N/A) INDOCYANINE GREEN FLUORESCENCE IMAGING (ICG) (N/A) RADIAL ARTERY HARVEST (Left) VEIN HARVEST OF RIGHT GREATER SAPHENOUS VEIN (Right) APPLICATION OF CELL SAVER (N/A)  CV-NSR in the 80s, BP well controlled. Continue asa/plavix/BB, statin allergy Pulm- tolerating 2L [place]. Added mucinex. Renal-creatinine 1.19, potassium 4.1, switch Lasix to PO 40mg  daily. Weight is down to baseline.  H and H 11.8/35.7, stable Endo-blood glucose well controlled   Plan: Making good progress with pulm toilet. He needs to ambulate in the halls 3x today and use his IS and flutter valve hourly. Continue mucinex. Weaning oxygen as tolerated, down to 2L today.    LOS: 6 days    01-29-1981 10/21/2020   I have seen and examined the patient and agree with the assessment and plan as outlined.  Making good progress.  Wean O2 as tolerated.  Possible d/c home 1-2 days  10/23/2020, MD 10/21/2020 12:32 PM

## 2020-10-21 NOTE — Plan of Care (Signed)

## 2020-10-21 NOTE — Progress Notes (Signed)
Mobility Specialist - Progress Note   10/21/20 1110  Mobility  Activity Ambulated in hall  Level of Assistance Standby assist, set-up cues, supervision of patient - no hands on  Assistive Device Front wheel walker  Distance Ambulated (ft) 170 ft  Mobility Ambulated with assistance in hallway  Mobility Response Tolerated well  Mobility performed by Mobility specialist  $Mobility charge 1 Mobility   Pre-mobility: 85 HR, 92% SpO2 During mobility: 96 HR, 97% SpO2 Post-mobility: 87 HR, 92% SpO2  Distance limited by fatigue. Pt on 3L O2 throughout, no SOB w/ ambulation. Pt in bed after walk, call bell at side.   Corey Salas Mobility Specialist Mobility Specialist Phone: [phone]

## 2020-10-21 NOTE — Plan of Care (Signed)
  Problem: Education: Goal: Knowledge of General Education information will improve Description: Including pain rating scale, medication(s)/side effects and non-pharmacologic comfort measures 10/21/2020 1947 by Luna Kitchens, RN Outcome: Progressing 10/21/2020 1946 by Luna Kitchens, RN Outcome: Progressing   Problem: Clinical Measurements: Goal: Ability to maintain clinical measurements within normal limits will improve 10/21/2020 1947 by Luna Kitchens, RN Outcome: Progressing 10/21/2020 1946 by Luna Kitchens, RN Outcome: Progressing Goal: Will remain free from infection 10/21/2020 1947 by Luna Kitchens, RN Outcome: Progressing 10/21/2020 1946 by Luna Kitchens, RN Outcome: Progressing Goal: Diagnostic test results will improve 10/21/2020 1947 by Luna Kitchens, RN Outcome: Progressing 10/21/2020 1946 by Luna Kitchens, RN Outcome: Progressing Goal: Respiratory complications will improve 10/21/2020 1947 by Luna Kitchens, RN Outcome: Progressing 10/21/2020 1946 by Luna Kitchens, RN Outcome: Progressing Goal: Cardiovascular complication will be avoided 10/21/2020 1947 by Luna Kitchens, RN Outcome: Progressing 10/21/2020 1946 by Luna Kitchens, RN Outcome: Progressing   Problem: Health Behavior/Discharge Planning: Goal: Ability to manage health-related needs will improve 10/21/2020 1947 by Luna Kitchens, RN Outcome: Progressing 10/21/2020 1946 by Luna Kitchens, RN Outcome: Progressing   Problem: Activity: Goal: Risk for activity intolerance will decrease 10/21/2020 1947 by Luna Kitchens, RN Outcome: Progressing 10/21/2020 1946 by Luna Kitchens, RN Outcome: Progressing   Problem: Nutrition: Goal: Adequate nutrition will be maintained 10/21/2020 1947 by Luna Kitchens, RN Outcome: Progressing 10/21/2020 1946 by Luna Kitchens, RN Outcome: Progressing   Problem: Coping: Goal: Level of anxiety will decrease 10/21/2020 1947 by Luna Kitchens, RN Outcome: Progressing 10/21/2020 1946 by Luna Kitchens, RN Outcome: Progressing   Problem: Elimination: Goal: Will not experience complications related to bowel motility 10/21/2020 1947 by Luna Kitchens, RN Outcome: Progressing 10/21/2020 1946 by Luna Kitchens, RN Outcome: Progressing Goal: Will not experience complications related to urinary retention 10/21/2020 1947 by Luna Kitchens, RN Outcome: Progressing 10/21/2020 1946 by Luna Kitchens, RN Outcome: Progressing   Problem: Pain Managment: Goal: General experience of comfort will improve 10/21/2020 1947 by Luna Kitchens, RN Outcome: Progressing 10/21/2020 1946 by Luna Kitchens, RN Outcome: Progressing   Problem: Safety: Goal: Ability to remain free from injury will improve 10/21/2020 1947 by Luna Kitchens, RN Outcome: Progressing 10/21/2020 1946 by Luna Kitchens, RN Outcome: Progressing   Problem: Skin Integrity: Goal: Risk for impaired skin integrity will decrease 10/21/2020 1947 by Luna Kitchens, RN Outcome: Progressing 10/21/2020 1946 by Luna Kitchens, RN Outcome: Progressing   Problem: Education: Goal: Will demonstrate proper wound care and an understanding of methods to prevent future damage Outcome: Progressing Goal: Knowledge of disease or condition will improve Outcome: Progressing Goal: Knowledge of the prescribed therapeutic regimen will improve Outcome: Progressing Goal: Individualized Educational Video(s) Outcome: Progressing   Problem: Activity: Goal: Risk for activity intolerance will decrease Outcome: Progressing   Problem: Cardiac: Goal: Will achieve and/or maintain hemodynamic stability Outcome: Progressing   Problem: Clinical Measurements: Goal: Postoperative complications will be avoided or minimized Outcome: Progressing

## 2020-10-22 ENCOUNTER — Inpatient Hospital Stay (HOSPITAL_COMMUNITY): Payer: Managed Care, Other (non HMO)

## 2020-10-22 LAB — BASIC METABOLIC PANEL
Anion gap: 10 (ref 5–15)
BUN: 19 mg/dL (ref 6–20)
CO2: 27 mmol/L (ref 22–32)
Calcium: 8.1 mg/dL — ABNORMAL LOW (ref 8.9–10.3)
Chloride: 94 mmol/L — ABNORMAL LOW (ref 98–111)
Creatinine, Ser: 1.21 mg/dL (ref 0.61–1.24)
GFR, Estimated: >60 mL/min (ref >60)
Glucose, Bld: 103 mg/dL — ABNORMAL HIGH (ref 70–99)
Potassium: 3.5 mmol/L (ref 3.5–5.1)
Sodium: 131 mmol/L — ABNORMAL LOW (ref 135–145)

## 2020-10-22 LAB — GLUCOSE, CAPILLARY
Glucose-Capillary: 101 mg/dL — ABNORMAL HIGH (ref 70–99)
Glucose-Capillary: 102 mg/dL — ABNORMAL HIGH (ref 70–99)
Glucose-Capillary: 105 mg/dL — ABNORMAL HIGH (ref 70–99)

## 2020-10-22 MED ORDER — NICOTINE 21 MG/24HR TD PT24
21.0000 mg | MEDICATED_PATCH | Freq: Every day | TRANSDERMAL | 0 refills | Status: DC
Start: 2020-10-22 — End: 2021-03-14

## 2020-10-22 MED ORDER — ALUM & MAG HYDROXIDE-SIMETH 200-200-20 MG/5ML PO SUSP: 30.0000 mL | Freq: Four times a day (QID) | ORAL | 0 refills | Status: DC | PRN

## 2020-10-22 MED ORDER — OXYCODONE HCL 5 MG PO TABS: 5.0000 mg | ORAL_TABLET | Freq: Four times a day (QID) | ORAL | 0 refills | Status: DC | PRN

## 2020-10-22 MED ORDER — CLOPIDOGREL BISULFATE 75 MG PO TABS: 75.0000 mg | ORAL_TABLET | Freq: Every day | ORAL | 1 refills | Status: DC

## 2020-10-22 MED ORDER — POTASSIUM CHLORIDE CRYS ER 10 MEQ PO TBCR
20.0000 meq | EXTENDED_RELEASE_TABLET | Freq: Once | ORAL | Status: AC
  Administered 2020-10-22: 20 meq via ORAL
  Filled 2020-10-22: qty 2

## 2020-10-22 MED ORDER — METOPROLOL TARTRATE 25 MG PO TABS: 12.5000 mg | ORAL_TABLET | Freq: Two times a day (BID) | ORAL | 1 refills | Status: DC

## 2020-10-22 MED ORDER — POTASSIUM CHLORIDE CRYS ER 10 MEQ PO TBCR
20.0000 meq | EXTENDED_RELEASE_TABLET | Freq: Once | ORAL | Status: DC
  Filled 2020-10-22: qty 2

## 2020-10-22 MED ORDER — GUAIFENESIN ER 600 MG PO TB12: 1200.0000 mg | ORAL_TABLET | Freq: Two times a day (BID) | ORAL | 0 refills | Status: AC

## 2020-10-22 NOTE — Progress Notes (Signed)
Patient discharge instructions reviewed and discussed, Patient verbalizes understanding.  Written copy of instructions given to patient. Once oxygen and walker delivered, patient via wheelchair to wife's waiting car in stable position. Reminded to turn off passenger airbag while healing

## 2020-10-22 NOTE — TOC Transition Note (Addendum)
Transition of Care [facility]) - CM/SW Discharge Note   Patient Details  Name: Corey Salas MRN: [id number] Date of Birth: [birth year]  Transition of Care [facility]) CM/SW Contact:  Leone Haven, RN Phone Number: 10/22/2020, 9:57 AM   Clinical Narrative:    Patient is for dc today, he will need home oxygen and a rolling walker, NCM spoke with wife who is in the room she states they are ok with [employer] supplying the DME for them.  NCM made referral to [facility] with [employer], the DME will be delivered to the room prior to dc. NCM gave wife the [facility] phone number to call to assist with getting a PCP in network.     Final next level of care: Home/Self Care Barriers to Discharge: No Barriers Identified   Patient Goals and CMS Choice Patient states their goals for this hospitalization and ongoing recovery are:: return home   Choice offered to / list presented to : NA  Discharge Placement                       Discharge Plan and Services                DME Arranged: Walker rolling, Oxygen DME Agency: [employer] Date DME Agency Contacted: 10/22/20 Time DME Agency Contacted: [phone] Representative spoke with at DME Agency: Ian Malkin HH Arranged: NA          Social Determinants of Health (SDOH) Interventions     Readmission Risk Interventions No flowsheet data found.

## 2020-10-22 NOTE — Plan of Care (Signed)
  Problem: Education: Goal: Knowledge of General Education information will improve Description: Including pain rating scale, medication(s)/side effects and non-pharmacologic comfort measures Outcome: Completed/Met   Problem: Health Behavior/Discharge Planning: Goal: Ability to manage health-related needs will improve Outcome: Completed/Met   Problem: Clinical Measurements: Goal: Ability to maintain clinical measurements within normal limits will improve Outcome: Completed/Met Goal: Will remain free from infection Outcome: Completed/Met Goal: Diagnostic test results will improve Outcome: Completed/Met Goal: Respiratory complications will improve Outcome: Completed/Met Goal: Cardiovascular complication will be avoided Outcome: Completed/Met   Problem: Activity: Goal: Risk for activity intolerance will decrease Outcome: Completed/Met   Problem: Nutrition: Goal: Adequate nutrition will be maintained Outcome: Completed/Met   Problem: Coping: Goal: Level of anxiety will decrease Outcome: Completed/Met   Problem: Elimination: Goal: Will not experience complications related to bowel motility Outcome: Completed/Met Goal: Will not experience complications related to urinary retention Outcome: Completed/Met   Problem: Pain Managment: Goal: General experience of comfort will improve Outcome: Completed/Met   Problem: Safety: Goal: Ability to remain free from injury will improve Outcome: Completed/Met   Problem: Skin Integrity: Goal: Risk for impaired skin integrity will decrease Outcome: Completed/Met   Problem: Education: Goal: Will demonstrate proper wound care and an understanding of methods to prevent future damage Outcome: Completed/Met Goal: Knowledge of disease or condition will improve Outcome: Completed/Met Goal: Knowledge of the prescribed therapeutic regimen will improve Outcome: Completed/Met Goal: Individualized Educational Video(s) Outcome: Completed/Met    Problem: Activity: Goal: Risk for activity intolerance will decrease Outcome: Completed/Met   Problem: Cardiac: Goal: Will achieve and/or maintain hemodynamic stability Outcome: Completed/Met   Problem: Clinical Measurements: Goal: Postoperative complications will be avoided or minimized Outcome: Completed/Met   Problem: Respiratory: Goal: Respiratory status will improve Outcome: Completed/Met   Problem: Skin Integrity: Goal: Wound healing without signs and symptoms of infection Outcome: Completed/Met Goal: Risk for impaired skin integrity will decrease Outcome: Completed/Met   Problem: Urinary Elimination: Goal: Ability to achieve and maintain adequate renal perfusion and functioning will improve Outcome: Completed/Met

## 2020-10-22 NOTE — Progress Notes (Addendum)
      [address]       [place] [id number]             [phone]      5 Days Post-Op Procedure(s) (LRB): CORONARY ARTERY BYPASS GRAFTING (CABG)x 4 USING BILATERAL INTERNAL MAMMARIES,  LEFT RADIAL ARTERY, AND RIGHT GSV, ON CARDIOPULMONARY BYPASS. (N/A) TRANSESOPHAGEAL ECHOCARDIOGRAM (TEE) (N/A) INDOCYANINE GREEN FLUORESCENCE IMAGING (ICG) (N/A) RADIAL ARTERY HARVEST (Left) VEIN HARVEST OF RIGHT GREATER SAPHENOUS VEIN (Right) APPLICATION OF CELL SAVER (N/A) Subjective: Feels okay this morning, wants to go home  Objective: Vital signs in last 24 hours: Temp:  [98.1 F (36.7 C)-98.7 F (37.1 C)] 98.7 F (37.1 C) (06/20 0710) Pulse Rate:  [78-89] 86 (06/20 0710) Cardiac Rhythm: Normal sinus rhythm (06/19 1900) Resp:  [14-23] 18 (06/20 0710) BP: (93-117)/(55-84) 95/69 (06/20 0710) SpO2:  [87 %-97 %] 97 % (06/20 0710) Weight:  [79.3 kg] 79.3 kg (06/20 0300)     Intake/Output from previous day: 06/19 0701 - 06/20 0700 In: 120 [P.O.:120] Out: 1200 [Urine:1200] Intake/Output this shift: No intake/output data recorded.  General appearance: alert, cooperative, and no distress Heart: regular rate and rhythm, S1, S2 normal, no murmur, click, rub or gallop Lungs: clear to auscultation bilaterally Abdomen: soft, non-tender; bowel sounds normal; no masses,  no organomegaly Extremities: extremities normal, atraumatic, no cyanosis or edema Wound: clean and dry  Lab Results: Recent Labs    10/20/20 0022  WBC 10.5  HGB 11.8*  HCT 35.7*  PLT 115*   BMET:  Recent Labs    10/21/20 0056 10/22/20 0104  NA 132* 131*  K 4.1 3.5  CL 98 94*  CO2 25 27  GLUCOSE 111* 103*  BUN 14 19  CREATININE 1.19 1.21  CALCIUM 8.4* 8.1*    PT/INR: No results for input(s): LABPROT, INR in the last 72 hours. ABG    Component Value Date/Time   PHART 7.400 10/18/2020 0500   HCO3 22.3 10/18/2020 0500   TCO2 23 10/18/2020 0500   ACIDBASEDEF 2.0 10/18/2020 0500   O2SAT  76.3 10/19/2020 0445   CBG (last 3)  Recent Labs    10/22/20 0014 10/22/20 0400 10/22/20 0708  GLUCAP 102* 105* 101*    Assessment/Plan: S/P Procedure(s) (LRB): CORONARY ARTERY BYPASS GRAFTING (CABG)x 4 USING BILATERAL INTERNAL MAMMARIES,  LEFT RADIAL ARTERY, AND RIGHT GSV, ON CARDIOPULMONARY BYPASS. (N/A) TRANSESOPHAGEAL ECHOCARDIOGRAM (TEE) (N/A) INDOCYANINE GREEN FLUORESCENCE IMAGING (ICG) (N/A) RADIAL ARTERY HARVEST (Left) VEIN HARVEST OF RIGHT GREATER SAPHENOUS VEIN (Right) APPLICATION OF CELL SAVER (N/A)   CV-NSR in the 80s, BP well controlled. Continue asa/plavix/BB, statin allergy Pulm- tolerating 2L [place]. Added mucinex. Renal-creatinine 1.21 potassium 3.5 switch Lasix to PO 40mg  daily. Weight is down to baseline.  H and H 11.8/35.7, stable Endo-blood glucose well controlled  Plan: Possible discharge today. Will discuss with Dr. 01-29-1981. 6 minute walk test to determine oxygen needs.    LOS: 7 days    Vickey Sages 10/22/2020  Check PA and lateral CXR; if ok, d/c home today. F/u Friday Dani Wallner Z. [place], [place] [phone]

## 2020-10-22 NOTE — Progress Notes (Signed)
CARDIAC REHAB PHASE I   D/c education completed with pt and wife. Pt educated on importance of site care and monitoring incisions daily. Encouraged continued IS use, walks, and sternal precautions. Pt given in-the-tube sheet along with heart healthy and diabetic diets. Reviewed restrictions and exercise guidelines. Pt requesting walker for home use, qualifying note added. Will refer to CRP II [place].  [id number] Reynold Bowen, RN BSN 10/22/2020 10:34 AM

## 2020-10-22 NOTE — Progress Notes (Signed)
SATURATION QUALIFICATIONS: (This note is used to comply with regulatory documentation for home oxygen)  Patient Saturations on Room Air at Rest = 93%  Patient Saturations on Room Air while Ambulating = 86%  Patient Saturations on 2 Liters of oxygen while Ambulating = 96%  Please briefly explain why patient needs home oxygen: Yes, pt desats during ambulation.

## 2020-10-23 ENCOUNTER — Telehealth: Payer: Self-pay | Admitting: Cardiovascular Disease

## 2020-10-23 ENCOUNTER — Telehealth: Payer: Self-pay

## 2020-10-23 NOTE — Telephone Encounter (Signed)
Patient's wife has question bout the discharge medication that the dr has on the discharge. Please advise

## 2020-10-23 NOTE — Telephone Encounter (Signed)
Called patient wife. She states they had a question about his Mylanta instructions- discussed and answered those questions.  Patient wife also states that he has had a low grade 99 temp this morning, I did advise for her to contact the surgery office and make them aware to get recommendations as patient is post CABG.  Patient wife verbalized understanding, thankful for call back.

## 2020-10-23 NOTE — Telephone Encounter (Signed)
Patient's wife, Corey Salas contacted the office concerned about his temperature. She stated that he felt cold so she checked his temp and is was 99.8 axillary. She stated when asked that his incisions looked ok and to be healing nicely.  He did not have any complaints, alert, some shortness of breath, but same as it was before leaving the hospital. And no cough or lower extremity swelling.  Per Dr. Vickey Sages, patient should keep close eye on incisions and contact the office back if any thing changes with signs/symptoms. Advised that he should increase his fluid intake and that he can take Ibuprofen sparingly as needed for temperature. Patient's wife acknowledged receipt.

## 2020-10-24 NOTE — Op Note (Signed)
CARDIOTHORACIC SURGERY OPERATIVE NOTE  Date of Procedure: 10/17/20  Preoperative Diagnosis: Severe 3-vessel Coronary Artery Disease  Postoperative Diagnosis: Same  Procedure:   Coronary Artery Bypass Grafting x 4  Left Internal Mammary Artery to Distal Left Anterior Descending Coronary Artery; left radial artery graft to posterior Descending Coronary Artery and right posterolateral coronary artery as a sequenced graft; right internal mammary artery graft to ramus intermedius coronary Artery; open saphenous vein Harvest from right thigh  Open left radial artery harvesting Bilateral internal mammary artery harvesting Completion graft surveillance with indocyanine green fluorescence angiography  Surgeon: B.  Lorayne Marek, MD  Assistant: Jacques Earthly, PA-C  Anesthesia: General  Operative Findings: Preserved left ventricular systolic function Good quality internal mammary artery conduits Good quality saphenous vein conduit and left radial artery conduit Good quality target vessels for grafting    BRIEF CLINICAL NOTE AND INDICATIONS FOR SURGERY  60 year old man sustained a STEMI while visiting [facility] [place] this past spring.  He was treated for the culprit lesion with a drug-eluting stent.  At the same time he had evidence for multivessel coronary artery disease including disease distal to the stent which was placed in the midportion of the right coronary artery.  He is referred for CABG.  He has been thoroughly evaluated and has been admitted for the past 2 days for a Brilinta washout.  He now presents for surgery.   DETAILS OF THE OPERATIVE PROCEDURE  Preparation:  The patient is brought to the operating room on the above mentioned date and central monitoring was established by the anesthesia team including placement of Swan-Ganz catheter and radial arterial line. The patient is placed in the supine position on the operating table.  Intravenous antibiotics are administered.  General endotracheal anesthesia is induced uneventfully. A Foley catheter is placed.  Baseline transesophageal echocardiogram was performed.  Findings were notable for preserved LV function and no significant valvular disease  The patient's chest, abdomen, both groins, the left upper extremity, and both lower extremities are prepared and draped in a sterile manner. A time out procedure is performed.   Surgical Approach and Conduit Harvest:  A median sternotomy incision was performed and the left internal mammary artery is dissected from the chest wall and prepared for bypass grafting. The left internal mammary artery is notably good quality conduit. Simultaneously, open left radial artery harvesting is performed. The radial artery conduit is notably good quality. After removal of the radial artery graft, the left forearm incision is closed in layers.  The arm is then tucked at the side.  Attention is then turned to the right hemithorax and the right internal mammary artery is pedicle mobilized in standard fashion.  Prior to dividing the pedicle distally, full dose heparin is given intravenously following systemic heparinization, the internal mammary artery conduits were transected distally noted to have excellent flow.  Each pedicle was then treated with solution of papaverine.   Extracorporeal Cardiopulmonary Bypass and Myocardial Protection:  The pericardium is opened. The ascending aorta is nondiseased in appearance. The ascending aorta and the right atrium are cannulated for cardiopulmonary bypass.  Adequate heparinization is verified.    A retrograde cardioplegia cannula is placed through the right atrium into the coronary sinus.  The entire pre-bypass portion of the operation was notable for stable hemodynamics.  Cardiopulmonary bypass was begun and the surface of the heart is inspected. Distal target vessels are selected for coronary artery bypass grafting. A cardioplegia cannula is placed  in the ascending aorta.  The patient is allowed to cool passively to 34C systemic temperature.  The aortic cross clamp is applied and cold blood cardioplegia is delivered initially in an antegrade fashion through the aortic root.   Supplemental cardioplegia is given retrograde through the coronary sinus catheter.  Iced saline slush is applied for topical hypothermia.  The initial cardioplegic arrest is rapid with early diastolic arrest.  Repeat doses of cardioplegia are administered intermittently throughout the entire cross clamp portion of the operation through the aortic root,  through the coronary sinus catheter, and through subsequently placed grafts in order to maintain completely flat electrocardiogram.   Coronary Artery Bypass Grafting:  The  posterior descending branch of the right coronary artery and right posterolateral artery were grafted using the left radial artery graft in a sequenced fashion.  At the site of distal anastomosis the target vessels were good quality and measured approximately 1.5 mm in diameter.  The ramus intermedius coronary artery was grafted with the pedicled right internal mammary artery which was brought underneath the aorta through the transverse sinus.  At the site of distal anastomosis the target vessel was good quality and measured approximately 1.5 mm in diameter.Anastomotic patency and runoff was confirmed with indocyanine green fluorescence imaging (SPY).   The distal left anterior coronary artery was grafted with the left internal mammary artery in an end-to-side fashion.  At the site of distal anastomosis the target vessel was good quality and measured approximately 1.5 mm in diameter. Anastomotic patency and runoff was confirmed with indocyanine green fluorescence imaging (SPY).   Proximal radial artery graft anastomosis required extension with a piece of vein graft which was harvested in an open technique from the right thigh.  The anastomosis was  accomplished onto the ascending aorta prior to removal of the aortic cross clamp.  De-airing procedures were performed and the aortic cross-clamp was removed.   Procedure Completion:  All proximal and distal coronary anastomoses were inspected for hemostasis and appropriate graft orientation. Epicardial pacing wires are fixed to the right ventricular outflow tract and to the right atrial appendage. The patient is rewarmed to 37C temperature. The patient is weaned and disconnected from cardiopulmonary bypass.  The patient's rhythm at separation from bypass was sinus bradycardia.  The patient was weaned from cardiopulmonary bypass with moderate inotropic support.   Followup transesophageal echocardiogram performed after separation from bypass revealed  no changes from the preoperative exam.  The aortic and venous cannula were removed uneventfully. Protamine was administered to reverse the anticoagulation. The mediastinum and pleural space were inspected for hemostasis and irrigated with saline solution. The mediastinum and both pleural spaces were drained using fluted chest tubes placed through separate stab incisions inferiorly.  The soft tissues anterior to the aorta were reapproximated loosely. The sternum is closed with double strength sternal wire. The soft tissues anterior to the sternum were closed in multiple layers and the skin is closed with a running subcuticular skin closure.  The post-bypass portion of the operation was notable for stable rhythm and hemodynamics.    Disposition:  The patient tolerated the procedure well and is transported to the surgical intensive care in stable condition. There are no intraoperative complications. All sponge instrument and needle counts are verified correct at completion of the operation.    Brantley Fling, MD 10/24/2020 3:17 PM

## 2020-10-25 ENCOUNTER — Encounter: Payer: Self-pay | Admitting: Physician Assistant

## 2020-10-25 ENCOUNTER — Telehealth: Payer: Managed Care, Other (non HMO) | Admitting: Physician Assistant

## 2020-10-25 ENCOUNTER — Telehealth: Payer: Self-pay

## 2020-10-25 DIAGNOSIS — U071 COVID-19: Secondary | ICD-10-CM | POA: Diagnosis not present

## 2020-10-25 NOTE — Patient Instructions (Signed)
1. COVID-19  - Severe COVID-19 with hypoxia and respiratory distress  - s/p quadruple bypass on 6/15  - call [phone] IMMEDIATELY and go to [facility] Emergency Room

## 2020-10-25 NOTE — Telephone Encounter (Signed)
Patient's wife contacted the office to state that her husband has tested positive, and so has she. She stated that he is weak and is sleeping a lot. He is s/p CABG with Dr. Vickey Sages 10/17/20. She stated that he spiked a temperature, sweating and coughing up mucus. She stated that he kept telling her that something didn't feel right. Advised to make a virtual appointment with [facility]'s physicians for care. Also contacted Lasandra Beech, social worker with [place] that helped locate a pharmacy in the area patient lived for medication delivery. Patient's wife stated that she would make an appointment.  Advised that if he was having chest pain, shortness of breath or severe weakness/lethargy that she should not wait for a virtual appointment, she should contact 911 for patient to be transported to the hospital. She acknowledged receipt.

## 2020-10-25 NOTE — Progress Notes (Signed)
Corey Salas, Corey Salas are scheduled for a virtual visit with your provider today.    Just as we do with appointments in the office, we must obtain your consent to participate.  Your consent will be active for this visit and any virtual visit you may have with one of our providers in the next 365 days.    If you have a MyChart account, I can also send a copy of this consent to you electronically.  All virtual visits are billed to your insurance company just like a traditional visit in the office.  As this is a virtual visit, video technology does not allow for your provider to perform a traditional examination.  This may limit your provider's ability to fully assess your condition.  If your provider identifies any concerns that need to be evaluated in person or the need to arrange testing such as labs, EKG, etc, we will make arrangements to do so.    Although advances in technology are sophisticated, we cannot ensure that it will always work on either your end or our end.  If the connection with a video visit is poor, we may have to switch to a telephone visit.  With either a video or telephone visit, we are not always able to ensure that we have a secure connection.   I need to obtain your verbal consent now.   Are you willing to proceed with your visit today?   Corey Salas has provided verbal consent on 10/25/2020 for a virtual visit (video or telephone).   Corey Forth, PA-C 10/25/2020  12:30 PM   Date:  10/25/2020   ID:  Corey Salas, DOB 07/22/1960, MRN [id number]  Patient Location: Home Provider Location: Home Office   Participants: Patient and Provider for Visit and Wrap up  Method of visit: Video  Location of Patient: Home Location of Provider: Home Office Consent was obtain for visit over the video. Services rendered by provider: Visit was performed via video  A video enabled telemedicine application was used and I verified that I am speaking with the correct person using two  identifiers.  PCP:  Pcp, No   Chief Complaint: COVID-19  History of Present Illness:    Corey Salas is a 60 y.o. male with history as stated below. Presents video telehealth for an acute care visit  Onset of symptoms was 6/21 and symptoms have been persistent and include: fever, cough, achy, fatigue, generalized weakness, shortness of breath, hypoxia.   CABGx4 on Wed 6/15 at [facility], came home on Monday 6/20, got sick on Tuesday 6/21. + home COVID test along with Corey Salas yesterday.  Corey Salas reports fevers between 101 and 103 axillary. Corey Salas reports he has oxygen at home, but has never had to use it.  Pt had an SPO2 of 88-89% this morning so she placed oxygen at 2L via [place] with an increase to 91-92%.  She reports he is working harder to breath and is only speaking in several word sentences.  Also states that he is so weak that he isn't walking and is sleeping almost constantly.   Pt reports he is very short of breath and has significant chest pain.  Modifying factors include: Tylenol for fever.  No other aggravating or relieving factors.  No other c/o.  The patient does have symptoms concerning for COVID-19 infection (fever, chills, cough, or new shortness of breath).  Patient has been tested for COVID during this illness - result: positive.   Past Medical, Surgical, Social  History, Allergies, and Medications have been Reviewed.  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  Substance Use Topics   Alcohol use: Yes    Alcohol/week: 48.0 standard drinks    Types: 48 Cans of beer per week     Current Outpatient Medications:    ALPRAZolam (XANAX) 1 MG tablet, Take 1 tablet (1 mg total) by mouth every 8 (eight) hours as needed for anxiety., Disp: , Rfl:    alum & mag hydroxide-simeth (MAALOX/MYLANTA) 200-200-20 MG/5ML suspension, Take 30 mLs by mouth every 6 (six) hours as needed for indigestion or heartburn., Disp: 355 mL, Rfl: 0   aspirin EC 81 MG tablet, Take 1 tablet (81 mg total) by mouth daily. Swallow whole., Disp: 90 tablet, Rfl: 3   clopidogrel (PLAVIX) 75 MG tablet, Take 1 tablet (75 mg total) by mouth daily., Disp: 30 tablet, Rfl: 1   esomeprazole (NEXIUM) 20 MG capsule, Take 20 mg by mouth daily at 12 noon., Disp: , Rfl:    guaiFENesin (MUCINEX) 600 MG 12 hr tablet, Take 2 tablets (1,200 mg total) by mouth 2 (two) times daily for 7 days., Disp: 28 tablet, Rfl: 0   metoprolol tartrate (LOPRESSOR) 25 MG tablet, Take 0.5 tablets (12.5 mg total) by mouth 2 (two) times daily., Disp: 30 tablet, Rfl: 1   Multiple Vitamins-Minerals (CENTRUM SILVER 50+MEN) TABS, Take 1 tablet by mouth daily., Disp: , Rfl:    nicotine (NICODERM CQ - DOSED IN MG/24 HOURS) 21 mg/24hr patch, Place 1 patch (21 mg total) onto the skin daily., Disp: 28 patch, Rfl: 0   oxyCODONE (OXY IR/ROXICODONE) 5 MG immediate release tablet, Take 1 tablet (5 mg total) by mouth every 6 (six) hours as needed for severe pain., Disp: 30 tablet, Rfl: 0   Allergies  Allergen Reactions   Atorvastatin     Swelling  "blew up" all over, hospitalized (2018 in [place])     Rosuvastatin Diarrhea    [place]rest[place]   Pt stopped taking ~ end of April 2022,  diarrhea resolved ~36 hours later.  Cardiologist Dr. Royann Shivers is aware he is not taking and will re-address lipid treatment after vascular procedures completed.      Review of Systems  Constitutional:   Positive for chills and fever.  HENT:  Positive for congestion. Negative for ear pain and sore throat.   Eyes:  Negative for blurred vision and double vision.  Respiratory:  Positive for cough and shortness of breath. Negative for wheezing.   Cardiovascular:  Positive for chest pain. Negative for palpitations and leg swelling.  Gastrointestinal:  Negative for abdominal pain, diarrhea, nausea and vomiting.  Genitourinary:  Negative for dysuria.  Musculoskeletal:  Negative for myalgias.  Skin:  Negative for rash.  Neurological:  Positive for weakness and headaches. Negative for loss of consciousness.  Psychiatric/Behavioral:  The patient is not nervous/anxious.   See HPI for history of present illness.  Physical Exam Constitutional:      General: He is not in acute distress.    Appearance: Normal appearance. He is well-developed. He is ill-appearing and diaphoretic.  HENT:     Head: Normocephalic and atraumatic.     Nose: Congestion present.  Eyes:     General: No scleral icterus.    Conjunctiva/sclera: Conjunctivae normal.  Pulmonary:     Effort: Tachypnea, accessory muscle usage and respiratory distress present.     Comments: Oxygen in place Musculoskeletal:        General: Normal range of motion.     Cervical back: Normal range of motion.  Skin:    Coloration: Skin is pale.  Neurological:     General: No focal deficit present.     Mental Status: He is alert.  Psychiatric:        Mood and Affect: Mood is anxious.              A&P  1. COVID-19  - Severe COVID-19 with hypoxia and respiratory distress  - s/p quadruple bypass on 6/15  - patient is critically ill  - call 911 IMMEDIATELY and go to [facility] Emergency Room    Patient voiced understanding and agreement to plan.   Time:   Today, I have spent 30 minutes with the patient with telehealth technology discussing the above problems, reviewing the chart, previous notes, medications and orders.    Tests  Ordered: No orders of the defined types were placed in this encounter.   Medication Changes: No orders of the defined types were placed in this encounter.    Disposition:  Call 911 IMMEDIATELY and go to [facility] Emergency Room   Signed, Corey Forth, PA-C  10/25/2020 1:02 PM

## 2020-10-31 ENCOUNTER — Telehealth: Payer: Self-pay | Admitting: *Deleted

## 2020-10-31 NOTE — Telephone Encounter (Signed)
Contacted patient's wife who had a few concerns about the patient. Per wife, Corey Salas, contracted Covid shortly after being discharged from the hospital s/p CABG 6/15. Patient was advised to receive care via the emergency room, however, did not. Patient c/o SOB with activity. Patient wears supplemental oxygen PRN per wife. Oxygen saturations today 95-98% without oxygen. Patient with c/o numbness to right knee extending to thigh. Denies warmth, redness or pain at site. Advised that this is likely related to SV harvesting and should improve with time. Advised patient to use his incentive spirometer routinely throughout the day. Encouraged frequent ambulation as well as hydration. Per wife, surgical incisions healing without complications. Advised if symptoms get worse to seek care from emergency room. Appointment with Dr. Vickey Sages moved up a week. Wife aware of new date and time.

## 2020-11-01 ENCOUNTER — Telehealth (HOSPITAL_COMMUNITY): Payer: Self-pay

## 2020-11-01 NOTE — Telephone Encounter (Signed)
Pt insurance is active and benefits verified through [place]. Co-pay $100.00, DED $0.00/$0.00 met, out of pocket $8,550.00/$8,550.00 met, co-insurance 0%. No pre-authorization required. CJ[employer], 11/01/20 @ 12:26PM, HFS#[id number]   Will contact patient to see if he is interested in the Cardiac Rehab Program. If interested, patient will need to complete follow up appt. Once completed, patient will be contacted for scheduling upon review by the RN Navigator.

## 2020-11-01 NOTE — Telephone Encounter (Signed)
Called and left message with pt wife Joan Mayans for pt to return CR phone call.

## 2020-11-07 ENCOUNTER — Other Ambulatory Visit: Payer: Self-pay | Admitting: Cardiothoracic Surgery

## 2020-11-07 DIAGNOSIS — Z951 Presence of aortocoronary bypass graft: Secondary | ICD-10-CM

## 2020-11-08 ENCOUNTER — Other Ambulatory Visit: Payer: Self-pay

## 2020-11-08 ENCOUNTER — Ambulatory Visit (INDEPENDENT_AMBULATORY_CARE_PROVIDER_SITE_OTHER): Payer: Self-pay | Admitting: Cardiothoracic Surgery

## 2020-11-08 ENCOUNTER — Ambulatory Visit
Admission: RE | Admit: 2020-11-08 | Discharge: 2020-11-08 | Disposition: A | Discharge status: Home / Self Care | Payer: Managed Care, Other (non HMO) | Source: Ambulatory Visit | Attending: Cardiothoracic Surgery | Admitting: Cardiothoracic Surgery

## 2020-11-08 VITALS — BP 123/82 | HR 72 | Resp 20 | Ht 71.0 in | Wt 170.0 lb

## 2020-11-08 DIAGNOSIS — I2510 Atherosclerotic heart disease of native coronary artery without angina pectoris: Secondary | ICD-10-CM

## 2020-11-08 DIAGNOSIS — Z951 Presence of aortocoronary bypass graft: Secondary | ICD-10-CM

## 2020-11-08 MED ORDER — OXYCODONE HCL 5 MG PO TABS: 5.0000 mg | ORAL_TABLET | ORAL | 0 refills | Status: DC | PRN

## 2020-11-08 NOTE — Progress Notes (Signed)
[address]       [name] [address]             [phone]     CARDIOTHORACIC SURGERY OFFICE NOTE  Referring Provider is Croitoru, Rachelle Hora, MD Primary Cardiologist is Christell Constant, MD PCP is Pcp, No   HPI:  60 year old man underwent CABG on 10/17/2020.  He did well per surgeon's discharge a few days later.  Unfortunately, upon discharge he developed COVID symptoms.  This is exacerbated his sternal incisional pain.  Fortunately, he is recovering from this.  He denies angina or shortness of breath   Current Outpatient Medications  Medication Sig Dispense Refill   ALPRAZolam (XANAX) 1 MG tablet Take 1 tablet (1 mg total) by mouth every 8 (eight) hours as needed for anxiety.     alum & mag hydroxide-simeth (MAALOX/MYLANTA) 200-200-20 MG/5ML suspension Take 30 mLs by mouth every 6 (six) hours as needed for indigestion or heartburn. 355 mL 0   aspirin EC 81 MG tablet Take 1 tablet (81 mg total) by mouth daily. Swallow whole. 90 tablet 3   clopidogrel (PLAVIX) 75 MG tablet Take 1 tablet (75 mg total) by mouth daily. 30 tablet 1   esomeprazole (NEXIUM) 20 MG capsule Take 20 mg by mouth daily at 12 noon.     metoprolol tartrate (LOPRESSOR) 25 MG tablet Take 0.5 tablets (12.5 mg total) by mouth 2 (two) times daily. 30 tablet 1   Multiple Vitamins-Minerals (CENTRUM SILVER 50+MEN) TABS Take 1 tablet by mouth daily.     nicotine (NICODERM CQ - DOSED IN MG/24 HOURS) 21 mg/24hr patch Place 1 patch (21 mg total) onto the skin daily. 28 patch 0   oxyCODONE (OXY IR/ROXICODONE) 5 MG immediate release tablet Take 1 tablet (5 mg total) by mouth every 6 (six) hours as needed for severe pain. 30 tablet 0   oxyCODONE (ROXICODONE) 5 MG immediate release tablet Take 1 tablet (5 mg total) by mouth every 4 (four) hours as needed for severe pain. 30 tablet 0   No current facility-administered medications for this visit.      Physical Exam:   BP 123/82   Pulse 72   Resp 20    Ht 5\' 11"  (1.803 m)   Wt 77.1 kg   SpO2 98% Comment: RA  BMI 23.71 kg/m   General:  Well-appearing no acute distress  Chest:   Clear to auscultation  CV:   Regular rate and rhythm  Incisions:  Healing well  Abdomen:  Soft nontender  Extremities:  No edema  Diagnostic Tests:  I personally reviewed his chest x-ray from today which demonstrates clear lung fields and stable mediastinal   Impression:  Doing well after CABG  Plan:  Follow-up as needed Continue to observe sternal precautions  I spent in excess of 15 minutes during the conduct of this office consultation and >50% of this time involved direct face-to-face encounter with the patient for counseling and/or coordination of their care.  Level 2                 10 minutes Level 3                 15 minutes Level 4                 25 minutes Level 5                 40 minutes  B.  Zane  Vickey Sages, MD 11/08/2020 2:53 PM

## 2020-11-15 ENCOUNTER — Ambulatory Visit (INDEPENDENT_AMBULATORY_CARE_PROVIDER_SITE_OTHER): Payer: Managed Care, Other (non HMO) | Admitting: Cardiovascular Disease

## 2020-11-15 ENCOUNTER — Encounter: Payer: Self-pay | Admitting: Cardiovascular Disease

## 2020-11-15 ENCOUNTER — Other Ambulatory Visit: Payer: Self-pay

## 2020-11-15 ENCOUNTER — Ambulatory Visit: Payer: Managed Care, Other (non HMO) | Admitting: Cardiothoracic Surgery

## 2020-11-15 VITALS — BP 140/80 | HR 69 | Ht 71.0 in | Wt 169.6 lb

## 2020-11-15 DIAGNOSIS — I6522 Occlusion and stenosis of left carotid artery: Secondary | ICD-10-CM

## 2020-11-15 DIAGNOSIS — F4310 Post-traumatic stress disorder, unspecified: Secondary | ICD-10-CM

## 2020-11-15 DIAGNOSIS — F419 Anxiety disorder, unspecified: Secondary | ICD-10-CM

## 2020-11-15 DIAGNOSIS — F17200 Nicotine dependence, unspecified, uncomplicated: Secondary | ICD-10-CM

## 2020-11-15 DIAGNOSIS — E782 Mixed hyperlipidemia: Secondary | ICD-10-CM

## 2020-11-15 DIAGNOSIS — I25111 Atherosclerotic heart disease of native coronary artery with angina pectoris with documented spasm: Secondary | ICD-10-CM

## 2020-11-15 DIAGNOSIS — E7800 Pure hypercholesterolemia, unspecified: Secondary | ICD-10-CM | POA: Diagnosis not present

## 2020-11-15 MED ORDER — ALPRAZOLAM 1 MG PO TABS: 1.0000 mg | ORAL_TABLET | Freq: Three times a day (TID) | ORAL | 1 refills | Status: DC | PRN

## 2020-11-15 MED ORDER — METOPROLOL TARTRATE 25 MG PO TABS: 12.5000 mg | ORAL_TABLET | Freq: Two times a day (BID) | ORAL | 3 refills | Status: DC

## 2020-11-15 MED ORDER — COQ10 100 MG PO CAPS: 300.0000 mg | ORAL_CAPSULE | Freq: Every day | ORAL | Status: DC

## 2020-11-15 MED ORDER — ROSUVASTATIN CALCIUM 10 MG PO TABS: 10.0000 mg | ORAL_TABLET | Freq: Every day | ORAL | 3 refills | Status: DC

## 2020-11-15 NOTE — Progress Notes (Signed)
Cardiology Office Note:    Date:  11/15/2020   ID:  Corey Salas, DOB 26-Jun-1960, MRN [id number]  PCP:  [facility]  Cardiologist:  Christell Constant, MD  Advanced Practice Provider:  No care team member to display Electrophysiologist:  None       Referring MD: No ref. provider found   No chief complaint on file.   History of Present Illness:     Corey Salas is a 60 y.o. 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  He subsequent underwent TCAR of the left carotid artery with Dr. Lenell Antu on Sep 13, 2020 and four-vessel bypass surgery with Dr. Vickey Sages on October 17, 2020 (all arterial revascularization with LIMA to LAD, RIMA to ramus intermedius, sequential free radial to PDA and PLA).  Antiplatelet therapy was temporarily interrupted to allow bypass surgery and was resumed at the time of discharge from the hospital.  Unfortunately, immediately after being released from the hospital he developed symptoms of COVID-19 with high fever.  His wife had the illness as well.  Fortunately did not have a lot of coughing and he has recovered from the infection without consequences.  Unfortunately he is still struggling with smoking cessation.  He is wearing a nicotine patch, but will still occasionally smoke a cigarette a few times a day.  He has had problems with nausea and vomiting which he discovered was due to taking Mylanta.  He is now taking cimetidine.  More than 10 years ago  he did have upper GI hemorrhage due to gastritis, while on aspirin.  Has numbness and some pain in his rib cage.  He also has some numbness on the external surface of his right thigh.  Although he had an all arterial revascularization, she does have a scar of saphenous vein graft harvesting from his right inner thigh, although I do not think the vein was used for grafting.  He reports a history of "hives" when taking atorvastatin, but did not have any skin reaction with rosuvastatin.  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  He is willing to restart rosuvastatin.  He has lifelong problems with PTSD and anxiety and takes an average of 1 Xanax tablet daily.  Previous attempts at treatment with SSRIs made him feel very sick.  He is yet to establish care with a primary care provider or psychiatrist in our area.  His initial presentation for coronary disease was in 2006 at age 25, when he had a myocardial infarction with placement of a drug-eluting stent to the right coronary artery (Taxus 3.5x16).  In 2009 he had stents placed to the RCA (Taxus 2.75x12) and LAD (Taxus 3.0x18).  In 2015 he had 2 stents placed while he was living in [place] (Records not available for that procedure).  Most of his cardiology care has been in [facility].  During one of his catheterizations he was noted to have vasospasm and has been diagnosed with Prinzmetal's angina (Dr. Collie Siad).  Most recently, also in [facility], he had  an acute inferior myocardial infarction on August 10, 2020 and received a drug-eluting stent to the right coronary artery it was also noted that he had severe disease in the left main coronary artery and multivessel stenoses.  The decision was made to proceed with stenting because he "kept having cardiac arrest on the Cath Lab table".  The plan was for him to undergo subsequent bypass surgery after 5 weeks of dual antiplatelet therapy.   However he now lives,  and his wife works, in [place] so they returned home, with a plan to undergo surgical evaluation here.  He has not had any angina pectoris since the last myocardial infarction.  He was told that his heart muscle was well preserved.  Our anesthesiology team had access to an echo report that showed "left ventricular systolic function was low normal with EF 50-55% and inferior wall hypokinesis.  There were no significant valvular abnormalities".  Unfortunately, I cannot locate a copy of that report.  We also do not have a report of the cardiac cath procedure, let alone the images.  In the meantime he had an acute ischemic stroke on 08/21/2020, sudden onset of right-sided weakness, right facial droop, dysarthria and ataxia.  MRI showed a small acute ischemic stroke in the left centrum semiovale.  CT angiography showed a 60% left carotid stenosis.  His neurological symptoms improved rapidly and at this point he only has some mild clumsiness in his right hand.  Cardiology did not see him during that hospitalization.  He was continued on aspirin and Brilinta and statin, scheduled for TCAR (relatively high location of the stenosis; recent MI with need for uninterrupted antiplatelet therapy) on Friday, April 29 with Dr. Lenell Antu.   He was evaluated by the anesthesiology team on 08/23/2020 and they requested cardiology evaluation before proceeding with the planned surgical procedure.  Past medical problems also include a history of GI bleeding from gastritis, more than 10 years ago.  This resolved with temporary discontinuation of aspirin.  He has hypertension that has generally been well treated.  He has a history of erectile dysfunction that responded to sildenafil.  He has a history of benign gynecomastia and stable pulmonary nodules.  He had delayed urticaria and angioedema after his first cardiac catheterization 2006, but this has not recurred on subsequent procedures. He has never  really been able to quit smoking in over 40 years.  In the past he also used to consume alcohol heavily, often consumes a daily sixpack of beer right now.  He has a lot of issues with anxiety.  He did very poorly in the hospital for his recent stroke when he was denied a nicotine patch.  He is originally from the [place], [place] to [place].  Past Medical History:  Diagnosis Date   Anginal pain (HCC)    CAD (coronary artery disease)    Chest pain    Dyspnea    08/23/20- "just before my heart attack, it is normal now, unless I I am stressed/"   Family history of adverse reaction to anesthesia 08/21/2020   Hypertension    Neuromuscular disorder (HCC)    neuropathy in both feet   Neuropathy    "bottom of my feet, they say it is from alcohol, I just drink beer now, not spirits."   PTSD (post-traumatic stress disorder)    PTSD (post-traumatic stress disorder)    STEMI (ST elevation myocardial infarction) (HCC) 08/06/2020   Stroke (HCC)    08/23/20 slight weakness in  right hand - can type and write- "I have to concentrate to keep pen in hand."    Past Surgical History:  Procedure Laterality Date   APPENDECTOMY     CORONARY ANGIOPLASTY WITH STENT PLACEMENT     6X   CORONARY ARTERY BYPASS GRAFT N/A 10/17/2020   Procedure: CORONARY ARTERY BYPASS GRAFTING (CABG)x 4 USING BILATERAL INTERNAL MAMMARIES,  LEFT RADIAL ARTERY, AND RIGHT GSV, ON CARDIOPULMONARY BYPASS.;  Surgeon: Linden Dolin, MD;  Location: [facility] OR;  Service: Open Heart Surgery;  Laterality: N/A;   FRACTURE SURGERY     right tibia repair   LEFT HEART CATH AND CORONARY ANGIOGRAPHY N/A 10/05/2020   Procedure: LEFT HEART CATH AND CORONARY ANGIOGRAPHY;  Surgeon: [name], MD;  Location: [facility] INVASIVE CV LAB;  Service: Cardiovascular;  Laterality: N/A;   RADIAL ARTERY HARVEST Left 10/17/2020   Procedure: RADIAL ARTERY HARVEST;  Surgeon: Linden Dolin, MD;  Location: [facility] OR;  Service: Open Heart Surgery;  Laterality:  Left;   TEE WITHOUT CARDIOVERSION N/A 10/17/2020   Procedure: TRANSESOPHAGEAL ECHOCARDIOGRAM (TEE);  Surgeon: Linden Dolin, MD;  Location: [facility] OR;  Service: Open Heart Surgery;  Laterality: N/A;   TRANSCAROTID ARTERY REVASCULARIZATION  Left 09/13/2020   Procedure: TRANSCAROTID ARTERY REVASCULARIZATION LEFT;  Surgeon: Leonie Douglas, MD;  Location: [facility] OR;  Service: Vascular;  Laterality: Left;   VEIN HARVEST Right 10/17/2020   Procedure: VEIN HARVEST OF RIGHT GREATER SAPHENOUS VEIN;  Surgeon: Linden Dolin, MD;  Location: [facility] OR;  Service: Open Heart Surgery;  Laterality: Right;    Current Medications: Current Meds  Medication Sig   aspirin EC 81 MG tablet Take 1 tablet (81 mg total) by mouth daily. Swallow whole.   clopidogrel (PLAVIX) 75 MG tablet Take 1 tablet (75 mg total) by mouth daily.   Coenzyme Q10 (COQ10) 100 MG CAPS Take 300 mg by mouth daily.   Multiple Vitamins-Minerals (CENTRUM SILVER 50+MEN) TABS Take 1 tablet by mouth daily.   nicotine (NICODERM CQ - DOSED IN MG/24 HOURS) 21 mg/24hr patch Place 1 patch (21 mg total) onto the skin daily.   oxyCODONE (ROXICODONE) 5 MG immediate release tablet Take 1 tablet (5 mg total) by mouth every 4 (four) hours as needed for severe pain.   rosuvastatin (CRESTOR) 10 MG tablet Take 1 tablet (10 mg total) by mouth daily.   [DISCONTINUED] ALPRAZolam (XANAX) 1 MG tablet Take 1 tablet (1 mg total) by mouth every 8 (eight) hours as needed for anxiety.   [DISCONTINUED] esomeprazole (NEXIUM) 20 MG capsule Take 20 mg by mouth daily at 12 noon.   [DISCONTINUED] metoprolol tartrate (LOPRESSOR) 25 MG tablet Take 0.5 tablets (12.5 mg total) by mouth 2 (two) times daily.     Allergies:   Atorvastatin and Rosuvastatin   Social History   Socioeconomic History   Marital status: Married    Spouse name: Not on file   Number of children: Not on file   Years of education: Not on file   Highest education level: Not on file  Occupational History    Not on file  Tobacco Use   Smoking status: Every Day    Packs/day: 1.00    Types: Cigarettes   Smokeless tobacco: Never   Tobacco comments:    Patient rolls his cigarettes- equaliavant to 1 pakg a day.  Vaping Use   Vaping Use: Never used  Substance and Sexual Activity   Alcohol use: Yes    Alcohol/week: 48.0 standard drinks  Types: 48 Cans of beer per week   Drug use: Never   Sexual activity: Not on file  Other Topics Concern   Not on file  Social History Narrative   Not on file   Social Determinants of Health   Financial Resource Strain: Not on file  Food Insecurity: No Food Insecurity   Worried About Running Out of Food in the Last Year: Never true   Ran Out of Food in the Last Year: Never true  Transportation Needs: No Transportation Needs   Lack of Transportation (Medical): No   Lack of Transportation (Non-Medical): No  Physical Activity: Not on file  Stress: No Stress Concern Present   Feeling of Stress : Only a little  Social Connections: Not on file     Family History: The patient's family history includes Heart attack in his father and mother.  His father died of heart disease at age 108.  ROS:   Please see the history of present illness.    All other systems reviewed and are negative.  EKGs/Labs/Other Studies Reviewed:    The following studies were reviewed today:   Cardiac catheterization 10/05/2020  Previously placed Mid RCA stent (unknown type) is widely patent. Prox RCA-1 lesion is 25% stenosed. Prox RCA-2 lesion is 70% stenosed. RPDA lesion is 70% stenosed. Ost LM to Mid LM lesion is 80% stenosed. 2nd Mrg lesion is 50% stenosed. Prox LAD lesion is 30% stenosed.   1. Severe diffuse 80% left main stenosis 2. Patent proximal LAD stent with mild nonobstructive LAD stenosis seen on limited angiography 3. Patent left circumflex and ramus intermedius seen on limited coronary imaging 4. Patent RCA with 60-70% proximal stenosis between 2 stents and  70% ostial PDA stenosis   Recommend: Cardiac surgical consultation for CABG Will need to determine plan for brilinta washout in this patient who is less than 2 months out from his inferior MI Continue anti-anginal medical therapy Diagnostic Dominance: Right    CABG 10/17/2020  Coronary Artery Bypass Grafting x 4             Left Internal Mammary Artery to Distal Left Anterior Descending Coronary Artery; left radial artery graft to posterior Descending Coronary Artery and right posterolateral coronary artery as a sequenced graft; right internal mammary artery graft to ramus intermedius coronary Artery; open saphenous vein Harvest from right thigh  Open left radial artery harvesting Bilateral internal mammary artery harvesting    carotid duplex ultrasound 08/22/2020 Right Carotid: Velocities in the right ICA are consistent with a 1-39% stenosis.  Left Carotid: Velocities in the left ICA are consistent with a 60-79% stenosis.  Vertebrals: Bilateral vertebral arteries demonstrate antegrade flow.   TTE 08/06/2020 [facility] [place]): Summary: Normal mitral valve structure and function. Trace mitral valve regurgitation. Normal aortic valve structure and function. No aortic regurgitation. No aortic stenosis. Normal tricuspid valve structure and function. Trace tricuspid valve regurgitation. Normal right ventricular systolic pressure. Normal pulmonic valve structure and function. No evidence of any pulmonic regurgitation. The left atrium is normal. Low normal left ventricular systolic function. There are left ventricular regional wall motion abnormalities.  See appended scoring diagram. Normal left ventricular diastolic function. Normal left ventricular wall thickness. The left ventricular ejection fraction by visual estimation is 50 to 55%. The right atrium is normal. Normal right ventricular size, structure and function. Aortic root appearance and  dimensions are normal. No evidence of pericardial effusion.  EKG:  EKG is ordered today.  Personally reviewed.  It  shows sinus rhythm, possible left atrial abnormality, right axis deviation consistent with posterior fascicular block.  There is T wave inversion in the inferior leads and also seen in leads V1 and V3, and usually skipping V2, although the leads appear to be placed correctly.  Flattened T waves in V4-V6.  Normal QT 440 ms Recent Labs: 10/15/2020: ALT 60 10/18/2020: Magnesium 2.3 10/20/2020: Hemoglobin 11.8; Platelets 115 10/22/2020: BUN 19; Creatinine, Ser 1.21; Potassium 3.5; Sodium 131  Recent Lipid Panel    Component Value Date/Time   CHOL 188 09/14/2020 0200   TRIG 88 09/14/2020 0200   HDL 87 09/14/2020 0200   CHOLHDL 2.2 09/14/2020 0200   VLDL 18 09/14/2020 0200   LDLCALC 83 09/14/2020 0200     Risk Assessment/Calculations:       Physical Exam:    VS:  BP 140/80   Pulse 69   Ht 5\' 11"  (1.803 m)   Wt 169 lb 9.6 oz (76.9 kg)   SpO2 99%   BMI 23.65 kg/m     Wt Readings from Last 3 Encounters:  11/15/20 169 lb 9.6 oz (76.9 kg)  11/08/20 170 lb (77.1 kg)  10/22/20 174 lb 13.2 oz (79.3 kg)     General: Alert, oriented x3, no distress, well-healed scars of sternotomy, chest tubes, right thigh SVG harvest and left TCAR Head: no evidence of trauma, PERRL, EOMI, no exophtalmos or lid lag, no myxedema, no xanthelasma; normal ears, nose and oropharynx Neck: normal jugular venous pulsations and no hepatojugular reflux; brisk carotid pulses without delay and no carotid bruits Chest: clear to auscultation, no signs of consolidation by percussion or palpation, normal fremitus, symmetrical and full respiratory excursions Cardiovascular: normal position and quality of the apical impulse, regular rhythm, normal first and second heart sounds, no murmurs, rubs or gallops Abdomen: no tenderness or distention, no masses by palpation, no abnormal pulsatility or arterial bruits,  normal bowel sounds, no hepatosplenomegaly Extremities: no clubbing, cyanosis or edema; 2+ radial, ulnar and brachial pulses bilaterally; 2+ right femoral, posterior tibial and dorsalis pedis pulses; 2+ left femoral, posterior tibial and dorsalis pedis pulses; no subclavian or femoral bruits Neurological: grossly nonfocal Psych: Normal mood and affect    ASSESSMENT:    1. Coronary artery disease involving native coronary artery of native heart with angina pectoris with documented spasm (HCC)   2. Mixed hyperlipidemia   3. Left carotid artery stenosis   4. Hypercholesterolemia   5. Smoker   6. PTSD (post-traumatic stress disorder)   7. Anxiety    PLAN:    In order of problems listed above:  CAD s/p recent IWMI/DES-RCA s/ p CABG x4: Recovering well.  Continue aspirin (with H2 blocker, but prefer switching to an alternative due to cimetidine drug interactions), metoprolol (maximum dose tolerated is 12.5 mg twice daily) and to resume rosuvastatin.  Strongly recommend cardiac rehab.  Continue sternal precautions for about another 2 weeks. LICA stenosis with recent L hemispheric ischemic stroke: Status post TCAR in May 2022.  Recovered well. History of vasospastic angina: Has not had any symptoms since his bypass procedure.  Currently not on any medicines for vasospasm. Hypercholesterolemia: Resume rosuvastatin. We will have him take the medication only 3 days a week, with co-Q10, to begin, to see if he tolerates it well, but would plan for him to be on rosuvastatin 10 mg daily long-term.  Target LDL less than 70 Smoking: Continue efforts to quit smoking permanently. HTN: Been checking blood pressure at home and its been running  in the 120s-130s/70s.  Continue metoprolol. PTSD/anxiety: We will give him a supply of alprazolam that should last him for about 3 months until he can establish care with a specialist. ED: Sildenafil is on his medication list.  He understands that he should never use  nitroglycerin or nitroglycerin containing products within 24 hours of taking this medication.        Medication Adjustments/Labs and Tests Ordered: Current medicines are reviewed at length with the patient today.  Concerns regarding medicines are outlined above.  Orders Placed This Encounter  Procedures   Lipid panel   EKG 12-Lead    Meds ordered this encounter  Medications   metoprolol tartrate (LOPRESSOR) 25 MG tablet    Sig: Take 0.5 tablets (12.5 mg total) by mouth 2 (two) times daily.    Dispense:  90 tablet    Refill:  3   ALPRAZolam (XANAX) 1 MG tablet    Sig: Take 1 tablet (1 mg total) by mouth every 8 (eight) hours as needed for anxiety.    Dispense:  45 tablet    Refill:  1   rosuvastatin (CRESTOR) 10 MG tablet    Sig: Take 1 tablet (10 mg total) by mouth daily.    Dispense:  30 tablet    Refill:  3   Coenzyme Q10 (COQ10) 100 MG CAPS    Sig: Take 300 mg by mouth daily.    Dispense:  30 capsule     Patient Instructions  Medication Instructions:  START Rosuvastatin 10 mg daily. For one month, take it three days a week. If you do well, then increase it to daily START CoQ10 300 mg once daily. This is over the counter. Please take Ranitidine  *If you need a refill on your cardiac medications before your next appointment, please call your pharmacy*   Lab Work: Your provider would like for you to return in 3 months before your next appointment to have the following labs drawn: fasting Lipid. You do not need an appointment for the lab. Once in our office lobby there is a podium where you can sign in and ring the doorbell to alert us that you are here. The lab is open from 8:00 am to 4:30 pm; closed for lunch from 12:45pm-1:45pm.  If you have labs (blood work) drawn today and your tests are completely normal, you will receive your results only by: MyChart Message (if you have MyChart) OR A paper copy in the mail If you have any lab test that is abnormal or we need to  change your treatment, we will call you to review the results.   Testing/Procedures: None ordered   Follow-Up: At [facility], you and your health needs are our priority.  As part of our continuing mission to provide you with exceptional heart care, we have created designated Provider Care Teams.  These Care Teams include your primary Cardiologist (physician) and Advanced Practice Providers (APPs -  Physician Assistants and Nurse Practitioners) who all work together to provide you with the care you need, when you need it.  We recommend signing up for the patient portal called "MyChart".  Sign up information is provided on this After Visit Summary.  MyChart is used to connect with patients for Virtual Visits (Telemedicine).  Patients are able to view lab/test results, encounter notes, upcoming appointments, etc.  Non-urgent messages can be sent to your provider as well.   To learn more about what you can do with MyChart, go to [email or web address].  Your next appointment:   4 month(s)  The format for your next appointment:   In Person  Provider:   Thurmon Fair, MD     Signed, Thurmon Fair, MD  11/15/2020 9:18 AM    [facility]

## 2020-11-15 NOTE — Patient Instructions (Signed)
Medication Instructions:  START Rosuvastatin 10 mg daily. For one month, take it three days a week. If you do well, then increase it to daily START CoQ10 300 mg once daily. This is over the counter. Please take Ranitidine  *If you need a refill on your cardiac medications before your next appointment, please call your pharmacy*   Lab Work: Your provider would like for you to return in 3 months before your next appointment to have the following labs drawn: fasting Lipid. You do not need an appointment for the lab. Once in our office lobby there is a podium where you can sign in and ring the doorbell to alert [place] that you are here. The lab is open from 8:00 am to 4:30 pm; closed for lunch from 12:45pm-1:45pm.  If you have labs (blood work) drawn today and your tests are completely normal, you will receive your results only by: MyChart Message (if you have MyChart) OR A paper copy in the mail If you have any lab test that is abnormal or we need to change your treatment, we will call you to review the results.   Testing/Procedures: None ordered   Follow-Up: At [facility], you and your health needs are our priority.  As part of our continuing mission to provide you with exceptional heart care, we have created designated Provider Care Teams.  These Care Teams include your primary Cardiologist (physician) and Advanced Practice Providers (APPs -  Physician Assistants and Nurse Practitioners) who all work together to provide you with the care you need, when you need it.  We recommend signing up for the patient portal called "MyChart".  Sign up information is provided on this After Visit Summary.  MyChart is used to connect with patients for Virtual Visits (Telemedicine).  Patients are able to view lab/test results, encounter notes, upcoming appointments, etc.  Non-urgent messages can be sent to your provider as well.   To learn more about what you can do with MyChart, go to  [email or web address].    Your next appointment:   4 month(s)  The format for your next appointment:   In Person  Provider:   Thurmon Fair, MD

## 2020-11-29 MED ORDER — BENZONATATE 100 MG PO CAPS: 100.0000 mg | ORAL_CAPSULE | Freq: Three times a day (TID) | ORAL | 1 refills | Status: DC | PRN

## 2020-12-07 ENCOUNTER — Other Ambulatory Visit: Payer: Self-pay | Admitting: *Deleted

## 2020-12-07 DIAGNOSIS — Z951 Presence of aortocoronary bypass graft: Secondary | ICD-10-CM

## 2020-12-07 NOTE — Progress Notes (Signed)
Patient contacted our office via MyChart messenger. Per patient he is experiencing sternal chest pain. Called phone number listed in patient's chart and spoke to patient's wife. Per wife, patient c/o internal pain "around the heart" and external pain. Internal pain was described as "spikes of pain". Per patient and wife, patient can not wear shirts or have a blanket on his chest without experiencing pain. Incision is painful to touch. Per patient's wife, patient describes pain as "nerve pain". Appointment made for patient to be evaluated by PA on Monday 8/8. Advised patient's wife to also get in contact with his cardiologist for further f/u. Advised if chest pain persists patient should get evaluated by the ED. Patient's wife verbalizes understanding. No further questions at this time.

## 2020-12-10 ENCOUNTER — Ambulatory Visit: Payer: Self-pay | Admitting: Physician Assistant

## 2020-12-10 ENCOUNTER — Ambulatory Visit
Admission: RE | Admit: 2020-12-10 | Discharge: 2020-12-10 | Disposition: A | Discharge status: Home / Self Care | Payer: Managed Care, Other (non HMO) | Source: Ambulatory Visit | Attending: Physician Assistant | Admitting: Physician Assistant

## 2020-12-10 ENCOUNTER — Encounter: Payer: Self-pay | Admitting: Physician Assistant

## 2020-12-10 ENCOUNTER — Other Ambulatory Visit: Payer: Self-pay

## 2020-12-10 ENCOUNTER — Other Ambulatory Visit: Payer: Self-pay | Admitting: *Deleted

## 2020-12-10 DIAGNOSIS — Z951 Presence of aortocoronary bypass graft: Secondary | ICD-10-CM

## 2020-12-10 DIAGNOSIS — Z4889 Encounter for other specified surgical aftercare: Secondary | ICD-10-CM | POA: Insufficient documentation

## 2020-12-10 DIAGNOSIS — L0291 Cutaneous abscess, unspecified: Secondary | ICD-10-CM

## 2020-12-10 MED ORDER — GABAPENTIN 300 MG PO CAPS: 300.0000 mg | ORAL_CAPSULE | Freq: Two times a day (BID) | ORAL | 0 refills | Status: DC

## 2020-12-10 NOTE — Progress Notes (Addendum)
HPI: Patient presents today with complaints of sternal nerve pain and increased sensitivity along sternal incision. According to patient, even wearing a shirt is very bothersome. He also has some numbness along both lateral chest walls, intermittent numbness outer left leg, and mild numbness by left thumb (radial artery harvest, likely cutaneous nerve but is improving per patient). Patient underwent a CABG x 4 by Dr. Vickey Sages. Of note, he had a stroke in April 2022 but slight hand weakness is improved. He was discharged in stable condition on 10/22/2020. He was last seen by Dr. Vickey Sages on 11/08/2020.  Of note, he did get COVID 19 after discharge. He no longer has a cough. He also denies chest pain, shortness of breath, fever, or chills.  Current Outpatient Medications  Medication Sig Dispense Refill   ALPRAZolam (XANAX) 1 MG tablet Take 1 tablet (1 mg total) by mouth every 8 (eight) hours as needed for anxiety. 45 tablet 1   alum & mag hydroxide-simeth (MAALOX/MYLANTA) 200-200-20 MG/5ML suspension Take 30 mLs by mouth every 6 (six) hours as needed for indigestion or heartburn. (Patient not taking: Reported on 11/15/2020) 355 mL 0   aspirin EC 81 MG tablet Take 1 tablet (81 mg total) by mouth daily. Swallow whole. 90 tablet 3   benzonatate (TESSALON PERLES) 100 MG capsule Take 1 capsule (100 mg total) by mouth every 8 (eight) hours as needed for cough. Do not chew or crush capsule. 30 capsule 1   clopidogrel (PLAVIX) 75 MG tablet Take 1 tablet (75 mg total) by mouth daily. 30 tablet 1   Coenzyme Q10 (COQ10) 100 MG CAPS Take 300 mg by mouth daily. 30 capsule    metoprolol tartrate (LOPRESSOR) 25 MG tablet Take 0.5 tablets (12.5 mg total) by mouth 2 (two) times daily. 90 tablet 3   Multiple Vitamins-Minerals (CENTRUM SILVER 50+MEN) TABS Take 1 tablet by mouth daily.     nicotine (NICODERM CQ - DOSED IN MG/24 HOURS) 21 mg/24hr patch Place 1 patch (21 mg total) onto the skin daily. 28 patch 0   oxyCODONE  (OXY IR/ROXICODONE) 5 MG immediate release tablet Take 1 tablet (5 mg total) by mouth every 6 (six) hours as needed for severe pain. (Patient not taking: Reported on 11/15/2020) 30 tablet 0   oxyCODONE (ROXICODONE) 5 MG immediate release tablet Take 1 tablet (5 mg total) by mouth every 4 (four) hours as needed for severe pain. 30 tablet 0   rosuvastatin (CRESTOR) 10 MG tablet Take 1 tablet (10 mg total) by mouth daily. 30 tablet 3   Vitals:   12/10/20 1535  BP: (!) 162/93  Pulse: 86  Resp: 20  SpO2: 97%     Physical Exam: CV-RRR Pulmonary-Clear to auscultation bilaterally Extremities-no LE edema Wounds- Sternal, LUE wounds and RLE wounds are clean and dry. Sternum has no instability. He is very sensitive along sternal wound when it is touched with a gloved hand.  Diagnostic Tests: Narrative & Impression  CLINICAL DATA:  History of CABG shortness of breath   EXAM: CHEST - 2 VIEW   COMPARISON:  11/08/2020   FINDINGS: Post sternotomy changes. Minimal atelectasis or scar at the left base. No consolidation, pleural effusion, or pneumothorax.   IMPRESSION: No active cardiopulmonary disease.     Electronically Signed   By: Jasmine Pang M.D.   On: 12/10/2020 15:50    Impression and Plan: I discussed with patient and his wife that numbness on lateral both sides of his chest is likely related to the takedown  of bilateral IMAs. The hypersensitivity he feels along sternum is related to surgery. He is agreeable to try Neurontin 300 mg bid. In addition, we will obtain CTA of the chest to make sure there is not an abscess or evidence of superficial infection. Also, patient is hypertensive on this visit. We may need to increase Lopressor and/or add another medication but will do so if hypertensive on next visit. Per wife request, a referral has been sent for cardiac rehab at [facility]. Patient will return to office next Monday.     Ardelle Balls, PA-C [facility] [phone]

## 2020-12-11 ENCOUNTER — Other Ambulatory Visit: Payer: Self-pay | Admitting: *Deleted

## 2020-12-13 ENCOUNTER — Telehealth (HOSPITAL_COMMUNITY): Payer: Self-pay

## 2020-12-13 ENCOUNTER — Other Ambulatory Visit: Payer: Self-pay | Admitting: Physician Assistant

## 2020-12-13 ENCOUNTER — Encounter (HOSPITAL_COMMUNITY): Payer: Self-pay

## 2020-12-13 NOTE — Telephone Encounter (Signed)
Unable to reach pt or leave a message in regards to cardiac rehab. Mailed letter.

## 2020-12-14 ENCOUNTER — Ambulatory Visit
Admission: RE | Admit: 2020-12-14 | Discharge: 2020-12-14 | Disposition: A | Discharge status: Home / Self Care | Payer: Managed Care, Other (non HMO) | Source: Ambulatory Visit | Attending: Physician Assistant | Admitting: Physician Assistant

## 2020-12-14 ENCOUNTER — Other Ambulatory Visit: Payer: Self-pay

## 2020-12-14 DIAGNOSIS — Z951 Presence of aortocoronary bypass graft: Secondary | ICD-10-CM

## 2020-12-14 DIAGNOSIS — L0291 Cutaneous abscess, unspecified: Secondary | ICD-10-CM

## 2020-12-14 MED ORDER — IOPAMIDOL (ISOVUE-370) INJECTION 76%
75.0000 mL | Freq: Once | INTRAVENOUS | Status: AC | PRN
  Administered 2020-12-14: 75 mL via INTRAVENOUS

## 2020-12-17 ENCOUNTER — Other Ambulatory Visit: Payer: Self-pay

## 2020-12-17 ENCOUNTER — Encounter: Payer: Self-pay | Admitting: Physician Assistant

## 2020-12-17 ENCOUNTER — Ambulatory Visit (INDEPENDENT_AMBULATORY_CARE_PROVIDER_SITE_OTHER): Payer: Self-pay | Admitting: Physician Assistant

## 2020-12-17 VITALS — BP 131/85 | HR 89 | Resp 20 | Ht 71.0 in

## 2020-12-17 DIAGNOSIS — Z951 Presence of aortocoronary bypass graft: Secondary | ICD-10-CM

## 2020-12-17 NOTE — Progress Notes (Addendum)
[address]       [place] [id number]             [phone]        Corey Salas is a 60 y.o. male patient most recently seen by Doree Fudge, PA-C for numbness of the chest wall and nerve pain.  This patient is status post coronary bypass grafting x4 with Dr. Renaldo Fiddler on 10/17/2020.  Last appointment he was prescribed gabapentin 300 mg twice daily for his nerve pain and it was decided to do a CTA of the chest which showed no unexpected findings.  No evidence of osteomyelitis or mediastinal infection.  He returns to the clinic today for follow-up.  Corey Salas has overall been doing better than when he was last seen in the clinic with the addition of gabapentin.  He does still have some sensitivity of his chest wall and some numbness.  He does feel like gabapentin that he is currently on is making a difference however he did express interest in increasing the dose.  I suggested continuing at his current dose since the medication was just started last week and the next time that we see him we can increase the dose if necessary.   1. S/P CABG (coronary artery bypass graft)    Past Medical History:  Diagnosis Date   Anginal pain (HCC)    CAD (coronary artery disease)    Chest pain    Dyspnea    08/23/20- "just before my heart attack, it is normal now, unless I I am stressed/"   Family history of adverse reaction to anesthesia 08/21/2020   Hypertension    Neuromuscular disorder (HCC)    neuropathy in both feet   Neuropathy    "bottom of my feet, they say it is from alcohol, I just drink beer now, not spirits."   PTSD (post-traumatic stress disorder)    PTSD (post-traumatic stress disorder)    STEMI (ST elevation myocardial infarction) (HCC) 08/06/2020   Stroke (HCC)    08/23/20 slight weakness in right hand - can type and write- "I have to concentrate to keep pen in hand."   No past surgical history pertinent negatives on file. Scheduled Meds: Current  Outpatient Medications on File Prior to Visit  Medication Sig Dispense Refill   ALPRAZolam (XANAX) 1 MG tablet Take 1 tablet (1 mg total) by mouth every 8 (eight) hours as needed for anxiety. 45 tablet 1   aspirin EC 81 MG tablet Take 1 tablet (81 mg total) by mouth daily. Swallow whole. 90 tablet 3   benzonatate (TESSALON PERLES) 100 MG capsule Take 1 capsule (100 mg total) by mouth every 8 (eight) hours as needed for cough. Do not chew or crush capsule. 30 capsule 1   clopidogrel (PLAVIX) 75 MG tablet Take 1 tablet (75 mg total) by mouth daily. 30 tablet 1   Coenzyme Q10 (COQ10) 100 MG CAPS Take 300 mg by mouth daily. 30 capsule    gabapentin (NEURONTIN) 300 MG capsule Take 1 capsule (300 mg total) by mouth 2 (two) times daily. 60 capsule 0   metoprolol tartrate (LOPRESSOR) 25 MG tablet Take 0.5 tablets (12.5 mg total) by mouth 2 (two) times daily. 90 tablet 3   Multiple Vitamins-Minerals (CENTRUM SILVER 50+MEN) TABS Take 1 tablet by mouth daily.     nicotine (NICODERM CQ - DOSED IN MG/24 HOURS) 21 mg/24hr patch Place 1 patch (21 mg total) onto the skin daily. 28 patch  0   oxyCODONE (OXY IR/ROXICODONE) 5 MG immediate release tablet Take 1 tablet (5 mg total) by mouth every 6 (six) hours as needed for severe pain. 30 tablet 0   oxyCODONE (ROXICODONE) 5 MG immediate release tablet Take 1 tablet (5 mg total) by mouth every 4 (four) hours as needed for severe pain. 30 tablet 0   rosuvastatin (CRESTOR) 10 MG tablet Take 1 tablet (10 mg total) by mouth daily. 30 tablet 3   No current facility-administered medications on file prior to visit.     Allergies  Allergen Reactions   Atorvastatin     Swelling  "blew up" all over, hospitalized (2018 in [place])     Rosuvastatin Diarrhea    [place]rest[place]   Pt stopped taking ~ end of April 2022,  diarrhea resolved ~36 hours later.  Cardiologist Dr. Royann Shivers is aware he is not taking and will re-address lipid  treatment after vascular procedures completed.    Active Problems:   * No active hospital problems. *  Blood pressure 131/85, pulse 89, resp. rate 20, height 5\' 11"  (1.803 m), SpO2 94 %.  CLINICAL DATA:  Status post CABG in June of this year. Rule out abscess. Sternal nerve pain.   Creatinine was obtained on site at [facility] at [address]   Results: Creatinine 1.1 mg/dL.   EXAM: CT CHEST WITH CONTRAST   TECHNIQUE: Multidetector CT imaging of the chest was performed during intravenous contrast administration.   CONTRAST:  68mL ISOVUE-370 IOPAMIDOL (ISOVUE-370) INJECTION 76%   COMPARISON:  Chest radiography, most recently 12/10/2020   FINDINGS: Cardiovascular: Previous median sternotomy and CABG. Heart size is normal. Small amount of pericardial fluid. Coronary artery calcification as expected. Ordinary mild aortic atherosclerotic calcification without evidence of aneurysm or dissection.   Mediastinum/Nodes: Mild stranding in the anterior mediastinum, typical this time frame status post CABG. No evidence drainable fluid collection or abscess. No adenopathy or mass.   Lungs/Pleura: The lungs are clear. No infiltrate, collapse or effusion.   Upper Abdomen: Mild fatty change of the liver. No focal or acute finding.   Musculoskeletal: Median sternotomy changes appear typical. Solid union has not yet occurred. No evidence of sternal destruction or adjacent soft tissue swelling or fluid collection.   IMPRESSION: No unexpected finding at about 2 months following CABG. No evidence of osteomyelitis or mediastinal infection. See above for minor expected findings.     Electronically Signed   By: 02/09/2021 M.D.   On: 12/14/2020 16:27  Cor: Regular rate and rhythm, no murmur Pulm: Clear to auscultation bilaterally and in all fields Abdomen: No tenderness Incisions: Sternal, radial, and right EVH site all healing well.  There is an area on his abdomen  that appears to be a small red but hard boil.  I was not able to express any drainage.  I am not sure if this was something from surgery or not.  It is located below his chest tube incisions.  Subjective Objective: Vital signs (most recent): Blood pressure 131/85, pulse 89, resp. rate 20, height 5\' 11"  (1.803 m), SpO2 94 %. Assessment & Plan  Corey Salas presents to the clinic today for follow-up of his sternal nerve pain most likely due to the bilateral mammary harvest that took place 8 weeks ago.  He does also have some numbness down the outside of his right leg.  This could potentially be due to the stroke that he had in April  of this year.  He has been trying to maintain a heart healthy diet and has been trying to quit smoking.  He does have patches available but continues to smoke.  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  He also has been drinking 1.5 L of wine a day plus a couple of beers.  He states that this is much better than before when he had PTSD and he was drinking liquor.  I highly recommended quitting smoking and to cut back on his drinking.  He understands that it is not good for his health and is making efforts to do so.  As far as his nerve pain in his chest he does notice a difference with the gabapentin.  They had expressed interest in increasing the dose however since the medication was just started a week ago I would prefer to keep him on his current dose and bring him back at the end of the month to reevaluate.  If he still is having discomfort we can always increase the dose at that time.  He also showed concern for a small boil on his abdomen away from all of his surgical incisions that seems to have recently come to ahead and popped.  Not sure what this is from but I continue to encourage him to keep it clean and dry.  We will certainly reevaluate it when he comes back in.  No medication changes were done at this time.  Drinking and smoking cessation  encouraged.  Will try to arrange f/u with [facility] for carotid stenosis  Follow-up in 2 weeks with our office for reevaluation of sternal nerve pain. May need to titrate up on Gabapentin at that time however with current alcohol use would be very cautious.   Sharlene Dory 12/17/2020

## 2020-12-20 ENCOUNTER — Telehealth: Payer: Self-pay | Admitting: Cardiovascular Disease

## 2020-12-20 MED ORDER — CLOPIDOGREL BISULFATE 75 MG PO TABS: 75.0000 mg | ORAL_TABLET | Freq: Every day | ORAL | 4 refills | Status: DC

## 2020-12-20 NOTE — Telephone Encounter (Signed)
*  STAT* If patient is at the pharmacy, call can be transferred to refill team.   1. Which medications need to be refilled? (please list name of each medication and dose if known)  clopidogrel (PLAVIX) 75 MG tablet  2. Which pharmacy/location (including street and city if local pharmacy) is medication to be sent to? [facility] [id number] - [place], [place] - [address]  3. Do they need a 30 day or 90 day supply?  90 day supply   Patient's wife states the patient has 1 tablet left.

## 2020-12-31 ENCOUNTER — Ambulatory Visit (INDEPENDENT_AMBULATORY_CARE_PROVIDER_SITE_OTHER): Payer: Self-pay | Admitting: Surgical

## 2020-12-31 ENCOUNTER — Other Ambulatory Visit: Payer: Self-pay

## 2020-12-31 VITALS — BP 136/82 | HR 80 | Resp 20 | Ht 71.0 in | Wt 171.0 lb

## 2020-12-31 DIAGNOSIS — Z951 Presence of aortocoronary bypass graft: Secondary | ICD-10-CM

## 2020-12-31 MED ORDER — GABAPENTIN 600 MG PO TABS
600.0000 mg | ORAL_TABLET | Freq: Three times a day (TID) | ORAL | 1 refills | Status: DC
Start: 2020-12-31 — End: 2021-01-29

## 2020-12-31 NOTE — Progress Notes (Signed)
[address]       [place] [address]             [phone]      Corey Salas [facility] Medical Record #[id number] Date of Birth: April 02, 1961  Referring: Thurmon Fair, MD Primary Care: Pcp, No Primary Cardiologist: Christell Constant, MD   Chief Complaint:   POST OP FOLLOW UP  History of Present Illness:    Patient is status post CABG approximately 9 weeks ago and continues to have ongoing discomfort from his sternal incision.  He claims that he has some relief from gabapentin but overall he remains significantly sensitive to even the slightest of touch.      Past Medical History:  Diagnosis Date   Anginal pain (HCC)    CAD (coronary artery disease)    Chest pain    Dyspnea    08/23/20- "just before my heart attack, it is normal now, unless I I am stressed/"   Family history of adverse reaction to anesthesia 08/21/2020   Hypertension    Neuromuscular disorder (HCC)    neuropathy in both feet   Neuropathy    "bottom of my feet, they say it is from alcohol, I just drink beer now, not spirits."   PTSD (post-traumatic stress disorder)    PTSD (post-traumatic stress disorder)    STEMI (ST elevation myocardial infarction) (HCC) 08/06/2020   Stroke (HCC)    08/23/20 slight weakness in right hand - can type and write- "I have to concentrate to keep pen in hand."     Social History   Tobacco Use  Smoking Status Every Day   Packs/day: 0.50   Types: Cigarettes  Smokeless Tobacco Never  Tobacco Comments   Patient rolls his cigarettes- equaliavant to 1 pakg a day.    Social History   Substance and Sexual Activity  Alcohol Use Yes   Alcohol/week: 48.0 standard drinks   Types: 48 Cans of beer per week     Allergies  Allergen Reactions   Atorvastatin     Swelling  "blew up" all over, hospitalized (2018 in [place])     Rosuvastatin Diarrhea    Crestor [place]07[place]2020 [place]   Pt stopped taking ~ end of  April 2022,  diarrhea resolved ~36 hours later.  Cardiologist Dr. Royann Shivers is aware he is not taking and will re-address lipid treatment after vascular procedures completed.     Current Outpatient Medications  Medication Sig Dispense Refill   ALPRAZolam (XANAX) 1 MG tablet Take 1 tablet (1 mg total) by mouth every 8 (eight) hours as needed for anxiety. 45 tablet 1   aspirin EC 81 MG tablet Take 1 tablet (81 mg total) by mouth daily. Swallow whole. 90 tablet 3   benzonatate (TESSALON PERLES) 100 MG capsule Take 1 capsule (100 mg total) by mouth every 8 (eight) hours as needed for cough. Do not chew or crush capsule. 30 capsule 1   clopidogrel (PLAVIX) 75 MG tablet Take 1 tablet (75 mg total) by mouth daily. 30 tablet 4   Coenzyme Q10 (COQ10) 100 MG CAPS Take 300 mg by mouth daily. 30 capsule    gabapentin (NEURONTIN) 300 MG capsule Take 1 capsule (300 mg total) by mouth 2 (two) times daily. 60 capsule 0   metoprolol tartrate (LOPRESSOR) 25 MG tablet Take 0.5 tablets (12.5 mg total) by mouth 2 (two) times daily. 90 tablet 3   Multiple Vitamins-Minerals (CENTRUM SILVER  50+MEN) TABS Take 1 tablet by mouth daily.     nicotine (NICODERM CQ - DOSED IN MG/24 HOURS) 21 mg/24hr patch Place 1 patch (21 mg total) onto the skin daily. 28 patch 0   rosuvastatin (CRESTOR) 10 MG tablet Take 1 tablet (10 mg total) by mouth daily. 30 tablet 3   No current facility-administered medications for this visit.       Physical Exam: BP 136/82 (BP Location: Left Arm, Patient Position: Sitting, Cuff Size: Normal)   Pulse 80   Resp 20   Ht 5\' 11"  (1.803 m)   Wt 171 lb (77.6 kg)   SpO2 95% Comment: ra  BMI 23.85 kg/m   Wound: The incisions look well-healed without evidence of infection.  The sternotomy incision is significantly sensitive to minor touch.   Diagnostic Studies & Laboratory data:     Recent Radiology Findings:   No results found.    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  Will increase gabapentin to 600 3 times daily.  We will see him again in 1 month or as needed.  If he has no further relief from this he may require referral to pain clinic.      Medication Changes: No orders of the defined types were placed in this encounter.    10/18/2020, PA-C  12/31/2020 3:51 PM

## 2020-12-31 NOTE — Patient Instructions (Signed)
Increase gabapentin to 600 3 times daily

## 2021-01-10 ENCOUNTER — Other Ambulatory Visit: Payer: Self-pay

## 2021-01-10 DIAGNOSIS — I6522 Occlusion and stenosis of left carotid artery: Secondary | ICD-10-CM

## 2021-01-15 ENCOUNTER — Ambulatory Visit: Payer: Managed Care, Other (non HMO)

## 2021-01-15 ENCOUNTER — Encounter (HOSPITAL_COMMUNITY): Payer: Managed Care, Other (non HMO)

## 2021-01-24 ENCOUNTER — Ambulatory Visit (INDEPENDENT_AMBULATORY_CARE_PROVIDER_SITE_OTHER): Payer: Managed Care, Other (non HMO) | Admitting: Nurse Practitioner

## 2021-01-24 ENCOUNTER — Other Ambulatory Visit: Payer: Self-pay

## 2021-01-24 ENCOUNTER — Encounter: Payer: Self-pay | Admitting: Nurse Practitioner

## 2021-01-24 VITALS — BP 142/88 | HR 83 | Temp 98.7°F | Ht 71.0 in | Wt 173.0 lb

## 2021-01-24 DIAGNOSIS — E782 Mixed hyperlipidemia: Secondary | ICD-10-CM

## 2021-01-24 DIAGNOSIS — I10 Essential (primary) hypertension: Secondary | ICD-10-CM

## 2021-01-24 DIAGNOSIS — F4310 Post-traumatic stress disorder, unspecified: Secondary | ICD-10-CM | POA: Diagnosis not present

## 2021-01-24 DIAGNOSIS — F1010 Alcohol abuse, uncomplicated: Secondary | ICD-10-CM

## 2021-01-24 DIAGNOSIS — Z720 Tobacco use: Secondary | ICD-10-CM

## 2021-01-24 DIAGNOSIS — Z8673 Personal history of transient ischemic attack (TIA), and cerebral infarction without residual deficits: Secondary | ICD-10-CM | POA: Diagnosis not present

## 2021-01-24 DIAGNOSIS — F419 Anxiety disorder, unspecified: Secondary | ICD-10-CM

## 2021-01-24 DIAGNOSIS — I2510 Atherosclerotic heart disease of native coronary artery without angina pectoris: Secondary | ICD-10-CM

## 2021-01-24 DIAGNOSIS — G8929 Other chronic pain: Secondary | ICD-10-CM

## 2021-01-24 MED ORDER — ALPRAZOLAM 1 MG PO TABS: 1.0000 mg | ORAL_TABLET | Freq: Three times a day (TID) | ORAL | 0 refills | Status: DC | PRN

## 2021-01-24 NOTE — Progress Notes (Signed)
I,Katawbba Wiggins,acting as a Neurosurgeon for [employer], NP.,have documented all relevant documentation on the behalf of [employer], NP,as directed by  Charlesetta Ivory, NP while in the presence of Charlesetta Ivory, NP.  This visit occurred during the SARS-CoV-2 public health emergency.  Safety protocols were in place, including screening questions prior to the visit, additional usage of staff PPE, and extensive cleaning of exam room while observing appropriate contact time as indicated for disinfecting solutions.  Subjective:     Patient ID: Corey Salas , male    DOB: 1961/01/08 , 60 y.o.   MRN: [id number]   Chief Complaint  Patient presents with   Establish Care    HPI  The patient is here to establish care. Lived in [place]. He sees Dr. Evalina Field.  He had heart attack August 06, 2020  Stroke x 2 weeks later  They did a carotid and they did a quadruple bypass sugery. Also had covid this year They moved to [place] because it was too far for his healthcare. He has stents put this year.  Smoking: half a pack x 48 years; nicotine patches.  He drinks a few beer or wine a day. A bottle of wine a day.  He has seen a psychiatrist in the past.  He sees the cardiologist often. Next appt in Nov.     Past Medical History:  Diagnosis Date   Anginal pain (HCC)    CAD (coronary artery disease)    Chest pain    Dyspnea    08/23/20- "just before my heart attack, it is normal now, unless I I am stressed/"   Family history of adverse reaction to anesthesia 08/21/2020   Hypertension    Neuromuscular disorder (HCC)    neuropathy in both feet   Neuropathy    "bottom of my feet, they say it is from alcohol, I just drink beer now, not spirits."   PTSD (post-traumatic stress disorder)    PTSD (post-traumatic stress disorder)    STEMI (ST elevation myocardial infarction) (HCC) 08/06/2020   Stroke (HCC)    08/23/20 slight weakness in right hand - can type and write- "I have to  concentrate to keep pen in hand."     Family History  Problem Relation Age of Onset   Heart attack Mother    Heart attack Father      Current Outpatient Medications:    aspirin EC 81 MG tablet, Take 1 tablet (81 mg total) by mouth daily. Swallow whole., Disp: 90 tablet, Rfl: 3   clopidogrel (PLAVIX) 75 MG tablet, Take 1 tablet (75 mg total) by mouth daily., Disp: 30 tablet, Rfl: 4   gabapentin (NEURONTIN) 600 MG tablet, Take 1 tablet (600 mg total) by mouth 3 (three) times daily. (Patient taking differently: Take 900 mg by mouth 3 (three) times daily. 3 tabs 900 mg 3 times per day), Disp: 90 tablet, Rfl: 1   metoprolol tartrate (LOPRESSOR) 25 MG tablet, Take 0.5 tablets (12.5 mg total) by mouth 2 (two) times daily., Disp: 90 tablet, Rfl: 3   nicotine (NICODERM CQ - DOSED IN MG/24 HOURS) 21 mg/24hr patch, Place 1 patch (21 mg total) onto the skin daily., Disp: 28 patch, Rfl: 0   ALPRAZolam (XANAX) 1 MG tablet, Take 1 tablet (1 mg total) by mouth every 8 (eight) hours as needed for anxiety., Disp: 45 tablet, Rfl: 0   Coenzyme Q10 (COQ10) 100 MG CAPS, Take 300 mg by mouth daily. (Patient not taking: Reported on 01/24/2021), Disp: 30  capsule, Rfl:    Multiple Vitamins-Minerals (CENTRUM SILVER 50+MEN) TABS, Take 1 tablet by mouth daily. (Patient not taking: Reported on 01/24/2021), Disp: , Rfl:    rosuvastatin (CRESTOR) 10 MG tablet, Take 1 tablet (10 mg total) by mouth daily. (Patient not taking: Reported on 01/24/2021), Disp: 30 tablet, Rfl: 3   Allergies  Allergen Reactions   Atorvastatin     Swelling  "blew up" all over, hospitalized (2018 in [place])     Rosuvastatin Diarrhea    [place]rest[place]   Pt stopped taking ~ end of April 2022,  diarrhea resolved ~36 hours later.  Cardiologist Dr. Royann Shivers is aware he is not taking and will re-address lipid treatment after vascular procedures completed.      Review of Systems  Constitutional:  Negative for  chills and fever.  HENT:  Negative for congestion.   Respiratory:  Negative for shortness of breath and wheezing.   Cardiovascular:  Negative for chest pain and palpitations.  Gastrointestinal:  Negative for constipation and diarrhea.  Endocrine: Negative for polydipsia, polyphagia and polyuria.  Neurological:  Negative for dizziness.  Psychiatric/Behavioral:  The patient is nervous/anxious.        PTSD/Anxiety     Today's Vitals   01/24/21 1032 01/24/21 1046  BP: (!) 156/102 (!) 142/88  Pulse: 83   Temp: 98.7 F (37.1 C)   TempSrc: Oral   Weight: 173 lb (78.5 kg)   Height: 5\' 11"  (1.803 m)    Body mass index is 24.13 kg/m.  Wt Readings from Last 3 Encounters:  01/24/21 173 lb (78.5 kg)  12/31/20 171 lb (77.6 kg)  12/10/20 170 lb 6.4 oz (77.3 kg)    BP Readings from Last 3 Encounters:  01/24/21 (!) 142/88  12/31/20 136/82  12/17/20 131/85    Objective:  Physical Exam Constitutional:      Appearance: Normal appearance.  HENT:     Head: Normocephalic and atraumatic.  Cardiovascular:     Rate and Rhythm: Normal rate and regular rhythm.     Pulses: Normal pulses.     Heart sounds: Normal heart sounds. No murmur heard. Pulmonary:     Effort: Pulmonary effort is normal. No respiratory distress.     Breath sounds: Normal breath sounds.  Skin:    Capillary Refill: Capillary refill takes less than 2 seconds.  Neurological:     Mental Status: He is alert and oriented to person, place, and time.        Assessment And Plan:     1. Coronary artery disease involving native coronary artery of native heart without angina pectoris -He is being followed by Cardiology  -He has had multiple percutaneous revascularization procedures including balloon angioplasty.    2. History of stroke -He had a recent stroke this year 08/21/20  -Followed by cardiology  -On antiplatelet therapy   2. PTSD (post-traumatic stress disorder) -Was seeing psych before but stopped couple of years  ago.  -Takes Xanax as needed as previous attempts with SSRI and other meds have made him sick.  - Ambulatory referral to Psychiatry  3. Essential hypertension -Currently taking Metoprolol 25 mg twice daily -Chronic -Followed by cardiology  -Limit the intake of processed foods and salt intake. You should increase your intake of green vegetables and fruits. Limit the use of alcohol. Limit fast foods and fried foods. Avoid high fatty saturated and trans fat foods. Keep yourself hydrated with drinking water. Avoid red meats. Eat lean meats  instead. Exercise for atleast 30-45 min for atleast 4-5 times a week.   4. Mixed hyperlipidemia -Seen by cardiology  -currently on crestor   5. Anxiety - ALPRAZolam (XANAX) 1 MG tablet; Take 1 tablet (1 mg total) by mouth every 8 (eight) hours as needed for anxiety.  Dispense: 45 tablet; Refill: 0 -Referral to psch has been placed.   6. Other chronic pain -Has tried multiple pain management  -Has been on gabapentin which does not seem to help him much.  - Ambulatory referral to Pain Clinic  7. Alcohol abuse -Currently drinks atleast 1 wine bottle a day as well as some beers daily  -Currently not interested in stopping as it helps with his anxiety   8. Tobacco abuse  -Currently smokes about 1/2 pack a day and has been smoking since 48 years  -Advised and education given to him about quitting. Has tried nicotine patches in the past which did not help.   Follow up: if symptoms persist or do not get better.   The patient was encouraged to call or send a message through MyChart for any questions or concerns.   Side effects and appropriate use of all the medication(s) were discussed with the patient today. Patient advised to use the medication(s) as directed by their healthcare provider. The patient was encouraged to read, review, and understand all associated package inserts and contact our office with any questions or concerns. The patient accepts the  risks of the treatment plan and had an opportunity to ask questions.   Staying healthy and adopting a healthy lifestyle for your overall health is important. You should eat 7 or more servings of fruits and vegetables per day. You should drink plenty of water to keep yourself hydrated and your kidneys healthy. This includes about 65-80+ fluid ounces of water. Limit your intake of animal fats especially for elevated cholesterol. Avoid highly processed food and limit your salt intake if you have hypertension. Avoid foods high in saturated/Trans fats. Along with a healthy diet it is also very important to maintain time for yourself to maintain a healthy mental health with low stress levels. You should get atleast 150 min of moderate intensity exercise weekly for a healthy heart. Along with eating right and exercising, aim for at least 7-9 hours of sleep daily.  Eat more whole grains which includes barley, wheat berries, oats, brown rice and whole wheat pasta. Use healthy plant oils which include olive, soy, corn, sunflower and peanut. Limit your caffeine and sugary drinks. Limit your intake of fast foods. Limit milk and dairy products to one or two daily servings.   Patient was given opportunity to ask questions. Patient verbalized understanding of the plan and was able to repeat key elements of the plan. All questions were answered to their satisfaction.  Raman Shalik Sanfilippo, DNP   I, Raman Nyela Cortinas have reviewed all documentation for this visit. The documentation on 01/24/21 for the exam, diagnosis, procedures, and orders are all accurate and complete.    IF YOU HAVE BEEN REFERRED TO A SPECIALIST, IT MAY TAKE 1-2 WEEKS TO SCHEDULE/PROCESS THE REFERRAL. IF YOU HAVE NOT HEARD FROM US/SPECIALIST IN TWO WEEKS, PLEASE GIVE [place] A CALL AT [phone].   THE PATIENT IS ENCOURAGED TO PRACTICE SOCIAL DISTANCING DUE TO THE COVID-19 PANDEMIC.

## 2021-01-29 ENCOUNTER — Telehealth: Payer: Self-pay

## 2021-01-29 ENCOUNTER — Ambulatory Visit: Payer: Managed Care, Other (non HMO) | Admitting: Physician Assistant

## 2021-01-29 ENCOUNTER — Other Ambulatory Visit: Payer: Self-pay

## 2021-01-29 VITALS — BP 120/82 | HR 87 | Resp 20 | Ht 71.0 in | Wt 173.0 lb

## 2021-01-29 DIAGNOSIS — Z4889 Encounter for other specified surgical aftercare: Secondary | ICD-10-CM | POA: Diagnosis not present

## 2021-01-29 MED ORDER — PREGABALIN 25 MG PO CAPS: 25.0000 mg | ORAL_CAPSULE | Freq: Two times a day (BID) | ORAL | 3 refills | Status: DC

## 2021-01-29 MED ORDER — SILDENAFIL CITRATE 50 MG PO TABS
50.0000 mg | ORAL_TABLET | Freq: Every day | ORAL | 0 refills | Status: DC | PRN
Start: 2021-01-29 — End: 2021-04-05

## 2021-01-29 NOTE — Progress Notes (Signed)
   Subjective:    Patient ID: Corey Salas, male    DOB: 1961-02-25, 60 y.o.   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 Vickey Sages.  Since that time the patient has been experiencing chronic pain and sensitivity.  He was last evaluated in our office by Gershon Crane PA-C on 12/31/2020 at which time his dose of Gabapentin was increased.  Also since that visit the patient established care with a Dr. Celesta Gentile on 9/22 at which time the patient stated the Gabapentin was not helping his pain.  They recommended a referral to pain management.  The patient presents today for follow up.  Overall the patient continues to have difficulties.  He states the he continues to experience pain along his sternotomy.  The Gabapentin works, however it causes him to feel unwell with GI symptoms.  Due to this the patient tapered off the medication and ultimately stopped it.  The patient also states as of late he is having a lot of anxiety with difficulty sleeping.  He states that he wakes up with panic attacks.  He states that he can not continue to go to multiple doctors as it makes things more stressful.  He has been referred to a psychiatrist and a pain management clinic.  Finally he requests a refill on his Sildenafil.  He continues to drink but has decreased the amount in addition to decreased smoking.    Objective:   Physical Exam  BP 120/82   Pulse 87   Resp 20   Ht 5\' 11"  (1.803 m)   Wt 173 lb (78.5 kg)   SpO2 96% Comment: RA  BMI 24.13 kg/m   distress, seems visibly anxious/frustrated/upset Heart: RRR Lungs: CTA Incisions: well healed, no evidence of infection     Assessment & Plan:    Corey Salas is >3 months from his coronary bypass procedure.  He continues to have neuropathic pain which was relieved with Neurontin.  However due to GI upset he ultimately tapered and discontinued the medication.  He  is willing to try Lyrica instead which has less GI side effects compared to Neurontin.  He is also suffering from known PTSD and Generalized anxiety disorder.  His new primary prescribed Xanax which he uses on an as needed basis.  He was encouraged to try not and nap during the day and get outside for some sunlight fresh air.  This will also help his body orient day and night time.  I think he will benefit from evaluation of psychologist who would be better suited to his anxiety needs.  Overall I do not feel there is anything else we can offer the patient.  I agree with referral to pain management which his PCP arranged.  I did give him a refill on his Sildenafil and he was educated to not use if on Nitrate agents.  He was also instructed we will not provide further refills on this medication and it would need to come from his PCP.    RTC prn Kennedy Bucker, PA-C

## 2021-01-29 NOTE — Telephone Encounter (Signed)
Mrs. Dec was notified that the office needed the notes from his previous provider, that [facility] is requesting notes before they will schedule him for the pain clinic referral.

## 2021-02-07 ENCOUNTER — Other Ambulatory Visit: Payer: Self-pay | Admitting: Physician Assistant

## 2021-02-20 ENCOUNTER — Ambulatory Visit: Payer: Managed Care, Other (non HMO) | Admitting: Nurse Practitioner

## 2021-03-13 ENCOUNTER — Other Ambulatory Visit: Payer: Self-pay | Admitting: Nurse Practitioner

## 2021-03-14 ENCOUNTER — Other Ambulatory Visit: Payer: Self-pay

## 2021-03-14 ENCOUNTER — Ambulatory Visit (INDEPENDENT_AMBULATORY_CARE_PROVIDER_SITE_OTHER): Payer: Managed Care, Other (non HMO) | Admitting: Cardiovascular Disease

## 2021-03-14 ENCOUNTER — Encounter: Payer: Self-pay | Admitting: Cardiovascular Disease

## 2021-03-14 VITALS — BP 152/92 | HR 77 | Ht 71.0 in | Wt 178.8 lb

## 2021-03-14 DIAGNOSIS — E782 Mixed hyperlipidemia: Secondary | ICD-10-CM

## 2021-03-14 DIAGNOSIS — F17200 Nicotine dependence, unspecified, uncomplicated: Secondary | ICD-10-CM

## 2021-03-14 DIAGNOSIS — I6522 Occlusion and stenosis of left carotid artery: Secondary | ICD-10-CM

## 2021-03-14 DIAGNOSIS — I25111 Atherosclerotic heart disease of native coronary artery with angina pectoris with documented spasm: Secondary | ICD-10-CM | POA: Diagnosis not present

## 2021-03-14 DIAGNOSIS — I10 Essential (primary) hypertension: Secondary | ICD-10-CM

## 2021-03-14 DIAGNOSIS — F4310 Post-traumatic stress disorder, unspecified: Secondary | ICD-10-CM

## 2021-03-14 LAB — COMPREHENSIVE METABOLIC PANEL
ALT: 69 IU/L — ABNORMAL HIGH (ref 0–44)
AST: 67 IU/L — ABNORMAL HIGH (ref 0–40)
Albumin/Globulin Ratio: 1.7 (ref 1.2–2.2)
Albumin: 4.7 g/dL (ref 3.8–4.9)
Alkaline Phosphatase: 85 IU/L (ref 44–121)
BUN/Creatinine Ratio: 5 — ABNORMAL LOW (ref 10–24)
BUN: 5 mg/dL — ABNORMAL LOW (ref 8–27)
Bilirubin Total: 0.6 mg/dL (ref 0.0–1.2)
CO2: 23 mmol/L (ref 20–29)
Calcium: 9.5 mg/dL (ref 8.6–10.2)
Chloride: 103 mmol/L (ref 96–106)
Creatinine, Ser: 1.06 mg/dL (ref 0.76–1.27)
Globulin, Total: 2.7 g/dL (ref 1.5–4.5)
Glucose: 88 mg/dL (ref 70–99)
Potassium: 5.1 mmol/L (ref 3.5–5.2)
Sodium: 139 mmol/L (ref 134–144)
Total Protein: 7.4 g/dL (ref 6.0–8.5)
eGFR: 80 mL/min/{1.73_m2} (ref >59)

## 2021-03-14 LAB — LIPID PANEL
Chol/HDL Ratio: 3.5 ratio (ref 0.0–5.0)
Cholesterol, Total: 192 mg/dL (ref 100–199)
HDL: 55 mg/dL (ref >39)
LDL Chol Calc (NIH): 115 mg/dL — ABNORMAL HIGH (ref 0–99)
Triglycerides: 126 mg/dL (ref 0–149)
VLDL Cholesterol Cal: 22 mg/dL (ref 5–40)

## 2021-03-14 MED ORDER — NICOTINE 21 MG/24HR TD PT24: 21.0000 mg | MEDICATED_PATCH | Freq: Every day | TRANSDERMAL | 0 refills | Status: AC

## 2021-03-14 MED ORDER — METOPROLOL SUCCINATE ER 25 MG PO TB24: 25.0000 mg | ORAL_TABLET | Freq: Every day | ORAL | 3 refills | Status: DC

## 2021-03-14 NOTE — Patient Instructions (Signed)
Medication Instructions:  Dr. Royann Shivers recommends Repatha (PCSK9). This is an injectable cholesterol medication. This medication will need prior approval with your insurance company, which we will work on. If the medication is not approved initially, we may need to do an appeal with your insurance. We will keep you updated on this process. This medication can be provided at some local pharmacies or be shipped to you from a specialty pharmacy.   *If you need a refill on your cardiac medications before your next appointment, please call your pharmacy*   Lab Work: Your provider would like for you to have the following labs today: fasting lipid and CMET  If you have labs (blood work) drawn today and your tests are completely normal, you will receive your results only by: MyChart Message (if you have MyChart) OR A paper copy in the mail If you have any lab test that is abnormal or we need to change your treatment, we will call you to review the results.   Testing/Procedures: None ordered   Follow-Up: At [facility], you and your health needs are our priority.  As part of our continuing mission to provide you with exceptional heart care, we have created designated Provider Care Teams.  These Care Teams include your primary Cardiologist (physician) and Advanced Practice Providers (APPs -  Physician Assistants and Nurse Practitioners) who all work together to provide you with the care you need, when you need it.  We recommend signing up for the patient portal called "MyChart".  Sign up information is provided on this After Visit Summary.  MyChart is used to connect with patients for Virtual Visits (Telemedicine).  Patients are able to view lab/test results, encounter notes, upcoming appointments, etc.  Non-urgent messages can be sent to your provider as well.   To learn more about what you can do with MyChart, go to [email or web address].    Your next appointment:   6 month(s)  The format  for your next appointment:   In Person  Provider:   Thurmon Fair, MD:1}   Appointment with pharmD to discuss Repatha.

## 2021-03-14 NOTE — Progress Notes (Signed)
Cardiology Office Note:    Date:  03/14/2021   ID:  Corey Salas, DOB [birth year], MRN [id number]  PCP:  Corey Ivory, NP   [facility]  Cardiologist:  Corey Fair, MD  Advanced Practice Provider:  No care team member to display Electrophysiologist:  None       Referring MD: No ref. provider found   Chief Complaint  Patient presents with   Coronary Artery Disease     History of Present Illness:     Corey Salas is a 60 y.o. 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  He subsequent underwent TCAR of the left carotid artery with Dr. Lenell Salas on Sep 13, 2020 and four-vessel bypass surgery with Dr. Vickey Salas on October 17, 2020 (all arterial revascularization with LIMA to LAD, RIMA to ramus intermedius, sequential free radial to PDA and PLA).  He had COVID-19 infection immediately after his release from the hospital.  He has recovered well from that.  He still has musculoskeletal chest pain following his bypass procedure, almost 5 months out.  He was hugging his heart patient pillow as he walked into the office today.  He has neuralgia type pain and seems to respond to a lidocaine patch.  He is also taking Lyrica.  He did not tolerate gabapentin.  His pain feels worse when he sits or stands up for longer periods of time.  He gets good relief when he lies in bed and feels better after resting for several hours or overnight.   Still smoking, but less than a pack a day.  He is trying  to quit but has difficulty due to his psychological issues.  He has also cut down on alcohol use.  Continues to have some issues with an upset stomach and he has a history of remote GI hemorrhage due to gastritis while on aspirin.  Prevacid controls his complaints.  Famotidine did not really help.  He reports a history of "hives" when taking atorvastatin, but did not have any skin reaction with rosuvastatin.  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  Tried to have him take rosuvastatin with coenzyme Q 10, but he had to stop it due to diarrhea.  He has lifelong problems with PTSD and anxiety and takes an average of 1 Xanax tablet daily.  Previous attempts at treatment with SSRIs made him feel very sick.    His initial presentation for coronary disease was in 2006 at age 34, when he had a myocardial infarction with placement of a drug-eluting stent to the right coronary artery (Taxus 3.5x16).  In 2009 he had stents placed to the RCA (Taxus 2.75x12) and LAD (Taxus 3.0x18).  In 2015 he had 2 stents placed while he was living in [place] (Records not available for that procedure).  Most of his cardiology care has been in [facility].  During one of his catheterizations he was noted to have vasospasm and has been diagnosed with Prinzmetal's angina (Dr. Collie Siad).  Most recently, also in [facility], he had an acute inferior myocardial  infarction on August 10, 2020 and received a drug-eluting stent to the right coronary artery it was also noted that he had severe disease in the left main coronary artery and multivessel stenoses.  The decision was made to proceed with stenting because he "kept having cardiac arrest on the Cath Lab table".  The plan was for him to undergo subsequent bypass surgery after 5 weeks of dual antiplatelet therapy.  However he now lives,  and his wife works, in [place] so they returned home, with a plan to  undergo surgical evaluation here.  He has not had any angina pectoris since the last myocardial infarction.  He was told that his heart muscle was well preserved.  Our anesthesiology team had access to an echo report that showed "left ventricular systolic function was low normal with EF 50-55% and inferior wall hypokinesis.  There were no significant valvular abnormalities".  Unfortunately, I cannot locate a copy of that report.  We also do not have a report of the cardiac cath procedure, let alone the images.  In the meantime he had an acute ischemic stroke on 08/21/2020, sudden onset of right-sided weakness, right facial droop, dysarthria and ataxia.  MRI showed a small acute ischemic stroke in the left centrum semiovale.  CT angiography showed a 60% left carotid stenosis.  His neurological symptoms improved rapidly and at this point he only has some mild clumsiness in his right hand.  Cardiology did not see him during that hospitalization.  He was continued on aspirin and Brilinta and statin, scheduled for TCAR (relatively high location of the stenosis; recent MI with need for uninterrupted antiplatelet therapy) on Friday, April 29 with Dr. Lenell Salas.   He was evaluated by the anesthesiology team on 08/23/2020 and they requested cardiology evaluation before proceeding with the planned surgical procedure.  Past medical problems also include a history of GI bleeding from gastritis, more than 10 years ago.  This resolved with temporary discontinuation of aspirin.  He has hypertension that has generally been well treated.  He has a history of erectile dysfunction that responded to sildenafil.  He has a history of benign gynecomastia and stable pulmonary nodules.  He had delayed urticaria and angioedema after his first cardiac catheterization 2006, but this has not recurred on subsequent procedures. He has never really been able to quit smoking in over 40 years.  In the past he also used to consume alcohol heavily,  often consumes a daily sixpack of beer right now.  He has a lot of issues with anxiety.  He did very poorly in the hospital for his recent stroke when he was denied a nicotine patch.  He is originally from the [place], close to [place].  Past Medical History:  Diagnosis Date   Anginal pain (HCC)    CAD (coronary artery disease)    Chest pain    Dyspnea    08/23/20- "just before my heart attack, it is normal now, unless I I am stressed/"   Family history of adverse reaction to anesthesia 08/21/2020   Hypertension    Neuromuscular disorder (HCC)    neuropathy in both feet   Neuropathy    "bottom of my feet, they say it is from alcohol, I just drink beer now, not spirits."   PTSD (post-traumatic stress disorder)    PTSD (post-traumatic stress disorder)    STEMI (ST elevation myocardial infarction) (HCC) 08/06/2020   Stroke (HCC)    08/23/20 slight weakness in right hand - can  type and write- "I have to concentrate to keep pen in hand."    Past Surgical History:  Procedure Laterality Date   APPENDECTOMY     CORONARY ANGIOPLASTY WITH STENT PLACEMENT     6X   CORONARY ARTERY BYPASS GRAFT N/A 10/17/2020   Procedure: CORONARY ARTERY BYPASS GRAFTING (CABG)x 4 USING BILATERAL INTERNAL MAMMARIES,  LEFT RADIAL ARTERY, AND RIGHT GSV, ON CARDIOPULMONARY BYPASS.;  Surgeon: Linden Dolin, MD;  Location: [facility] OR;  Service: Open Heart Surgery;  Laterality: N/A;   FRACTURE SURGERY     right tibia repair   LEFT HEART CATH AND CORONARY ANGIOGRAPHY N/A 10/05/2020   Procedure: LEFT HEART CATH AND CORONARY ANGIOGRAPHY;  Surgeon: [name], MD;  Location: [facility] INVASIVE CV LAB;  Service: Cardiovascular;  Laterality: N/A;   RADIAL ARTERY HARVEST Left 10/17/2020   Procedure: RADIAL ARTERY HARVEST;  Surgeon: Linden Dolin, MD;  Location: [facility] OR;  Service: Open Heart Surgery;  Laterality: Left;   TEE WITHOUT CARDIOVERSION N/A 10/17/2020   Procedure: TRANSESOPHAGEAL ECHOCARDIOGRAM (TEE);   Surgeon: Linden Dolin, MD;  Location: [facility] OR;  Service: Open Heart Surgery;  Laterality: N/A;   TRANSCAROTID ARTERY REVASCULARIZATION  Left 09/13/2020   Procedure: TRANSCAROTID ARTERY REVASCULARIZATION LEFT;  Surgeon: Leonie Douglas, MD;  Location: [facility] OR;  Service: Vascular;  Laterality: Left;   VEIN HARVEST Right 10/17/2020   Procedure: VEIN HARVEST OF RIGHT GREATER SAPHENOUS VEIN;  Surgeon: Linden Dolin, MD;  Location: [facility] OR;  Service: Open Heart Surgery;  Laterality: Right;    Current Medications: Current Meds  Medication Sig   ALPRAZolam (XANAX) 1 MG tablet Take 1 tablet (1 mg total) by mouth every 8 (eight) hours as needed for anxiety.   aspirin EC 81 MG tablet Take 1 tablet (81 mg total) by mouth daily. Swallow whole.   clopidogrel (PLAVIX) 75 MG tablet Take 1 tablet (75 mg total) by mouth daily.   metoprolol succinate (TOPROL-XL) 25 MG 24 hr tablet Take 1 tablet (25 mg total) by mouth daily. Take with or immediately following a meal.   Multiple Vitamins-Minerals (CENTRUM SILVER 50+MEN) TABS Take 1 tablet by mouth daily.   pregabalin (LYRICA) 25 MG capsule Take 1 capsule (25 mg total) by mouth 2 (two) times daily.   sildenafil (VIAGRA) 50 MG tablet Take 1 tablet (50 mg total) by mouth daily as needed for erectile dysfunction.   [DISCONTINUED] Coenzyme Q10 (COQ10) 100 MG CAPS Take 300 mg by mouth daily.   [DISCONTINUED] metoprolol tartrate (LOPRESSOR) 25 MG tablet Take 0.5 tablets (12.5 mg total) by mouth 2 (two) times daily.   [DISCONTINUED] nicotine (NICODERM CQ - DOSED IN MG/24 HOURS) 21 mg/24hr patch Place 1 patch (21 mg total) onto the skin daily.     Allergies:   Sertraline, Atorvastatin, and Rosuvastatin   Social History   Socioeconomic History   Marital status: Married    Spouse name: Not on file   Number of children: Not on file   Years of education: Not on file   Highest education level: Not on file  Occupational History   Not on file  Tobacco Use   Smoking  status: Every Day    Packs/day: 0.50    Types: Cigarettes   Smokeless tobacco: Never   Tobacco comments:    Patient rolls his cigarettes- equaliavant to 1 pakg a day.   Vaping Use   Vaping Use: Never used  Substance and Sexual Activity   Alcohol use: Yes    Alcohol/week:  48.0 standard drinks    Types: 48 Cans of beer per week   Drug use: Never   Sexual activity: Not on file  Other Topics Concern   Not on file  Social History Narrative   Not on file   Social Determinants of Health   Financial Resource Strain: Not on file  Food Insecurity: No Food Insecurity   Worried About Running Out of Food in the Last Year: Never true   Ran Out of Food in the Last Year: Never true  Transportation Needs: No Transportation Needs   Lack of Transportation (Medical): No   Lack of Transportation (Non-Medical): No  Physical Activity: Not on file  Stress: No Stress Concern Present   Feeling of Stress : Only a little  Social Connections: Not on file     Family History: The patient's family history includes Heart attack in his father and mother.  His father died of heart disease at age 70.  ROS:   Please see the history of present illness.    All other systems reviewed and are negative.  EKGs/Labs/Other Studies Reviewed:    The following studies were reviewed today:   Cardiac catheterization 10/05/2020  Previously placed Mid RCA stent (unknown type) is widely patent. Prox RCA-1 lesion is 25% stenosed. Prox RCA-2 lesion is 70% stenosed. RPDA lesion is 70% stenosed. Ost LM to Mid LM lesion is 80% stenosed. 2nd Mrg lesion is 50% stenosed. Prox LAD lesion is 30% stenosed.   1. Severe diffuse 80% left main stenosis 2. Patent proximal LAD stent with mild nonobstructive LAD stenosis seen on limited angiography 3. Patent left circumflex and ramus intermedius seen on limited coronary imaging 4. Patent RCA with 60-70% proximal stenosis between 2 stents and 70% ostial PDA stenosis    Recommend: Cardiac surgical consultation for CABG Will need to determine plan for brilinta washout in this patient who is less than 2 months out from his inferior MI Continue anti-anginal medical therapy Diagnostic Dominance: Right    CABG 10/17/2020  Coronary Artery Bypass Grafting x 4             Left Internal Mammary Artery to Distal Left Anterior Descending Coronary Artery; left radial artery graft to posterior Descending Coronary Artery and right posterolateral coronary artery as a sequenced graft; right internal mammary artery graft to ramus intermedius coronary Artery; open saphenous vein Harvest from right thigh  Open left radial artery harvesting Bilateral internal mammary artery harvesting    carotid duplex ultrasound 08/22/2020 Right Carotid: Velocities in the right ICA are consistent with a 1-39% stenosis.  Left Carotid: Velocities in the left ICA are consistent with a 60-79% stenosis.  Vertebrals: Bilateral vertebral arteries demonstrate antegrade flow.   TTE 08/06/2020 [facility]): Summary: Normal mitral valve structure and function. Trace mitral valve regurgitation. Normal aortic valve structure and function. No aortic regurgitation. No aortic stenosis. Normal tricuspid valve structure and function. Trace tricuspid valve regurgitation. Normal right ventricular systolic pressure. Normal pulmonic valve structure and function. No evidence of any pulmonic regurgitation. The left atrium is normal. Low normal left ventricular systolic function. There are left ventricular regional wall motion abnormalities.  See appended scoring diagram. Normal left ventricular diastolic function. Normal left ventricular wall thickness. The left ventricular ejection fraction by visual estimation is 50 to 55%. The right atrium is normal. Normal right ventricular size, structure and function. Aortic root appearance and dimensions are normal. No  evidence of pericardial effusion.  EKG:  EKG is ordered  today.  Personally reviewed.  It shows sinus rhythm, possible left atrial abnormality, right axis deviation consistent with posterior fascicular block.  There is T wave inversion in the inferior leads and also seen in leads V1 and V3, and usually skipping V2, although the leads appear to be placed correctly.  Flattened T waves in V4-V6.  Normal QT 440 ms Recent Labs: 10/15/2020: ALT 60 10/18/2020: Magnesium 2.3 10/20/2020: Hemoglobin 11.8; Platelets 115 10/22/2020: BUN 19; Creatinine, Ser 1.21; Potassium 3.5; Sodium 131  Recent Lipid Panel    Component Value Date/Time   CHOL 188 09/14/2020 0200   TRIG 88 09/14/2020 0200   HDL 87 09/14/2020 0200   CHOLHDL 2.2 09/14/2020 0200   VLDL 18 09/14/2020 0200   LDLCALC 83 09/14/2020 0200     Risk Assessment/Calculations:       Physical Exam:    VS:  BP (!) 152/92   Pulse 77   Ht  (1.803 m)   Wt 178 lb 12.8 oz (81.1 kg)   SpO2 99%   BMI 24.94 kg/m     Wt Readings from Last 3 Encounters:  03/14/21 178 lb 12.8 oz (81.1 kg)  01/29/21 173 lb (78.5 kg)  01/24/21 173 lb (78.5 kg)    Recheck BP was only slightly lower at 149/89.  He is however in a lot of discomfort.  General: Alert, oriented x3, no distress, well-healed scars from previous TCAR, CABG, chest tubes, and saphenous vein harvest sites. Head: no evidence of trauma, PERRL, EOMI, no exophtalmos or lid lag, no myxedema, no xanthelasma; normal ears, nose and oropharynx Neck: normal jugular venous pulsations and no hepatojugular reflux; brisk carotid pulses without delay and no carotid bruits Chest: clear to auscultation, no signs of consolidation by percussion or palpation, normal fremitus, symmetrical and full respiratory excursions Cardiovascular: normal position and quality of the apical impulse, regular rhythm, normal first and second heart sounds, no murmurs, rubs or gallops Abdomen: no tenderness or distention, no  masses by palpation, no abnormal pulsatility or arterial bruits, normal bowel sounds, no hepatosplenomegaly Extremities: no clubbing, cyanosis or edema; 2+ radial, ulnar and brachial pulses bilaterally; 2+ right femoral, posterior tibial and dorsalis pedis pulses; 2+ left femoral, posterior tibial and dorsalis pedis pulses; no subclavian or femoral bruits Neurological: grossly nonfocal Psych: Normal mood and affect     ASSESSMENT:    1. Coronary artery disease involving native coronary artery of native heart with angina pectoris with documented spasm (HCC)   2. Mixed hyperlipidemia   3. Left carotid artery stenosis   4. Smoker   5. Essential hypertension   6. PTSD (post-traumatic stress disorder)     PLAN:    In order of problems listed above:  CAD s/p recent IWMI/DES-RCA s/ p CABG x4: Recovering well.  Continue dual antiplatelet therapy through next April.  After that we may decide to switch to clopidogrel monotherapy since aspirin seems to upset his stomach.  We will switch to metoprolol succinate since he has difficulty swallowing pills cut in half.  The biggest remaining issue is her ongoing tobacco use and our inability to find an effective lipid-lowering treatment. LICA stenosis with recent L hemispheric ischemic stroke: Status post TCAR in May 2022.  Recovered well.  No residual sequelae History of vasospastic angina: Does not have any symptoms to suggest angina, either vasospastic or exertional.  Currently not receiving any vasodilator medications. Hypercholesterolemia: Her LDL cholesterol less than 78, preferably less than 55.  Intolerant to multiple statins.  We will  try Repatha. Smoking: Wrongly encouraged his efforts to quit smoking.  Scribed nicotine patches. HTN: At home his blood pressure is consistently in the 120s/70s.  Blood pressure little high today since he is having some pain PTSD/anxiety: Makes it hard for him to stop smoking and drinking alcohol. ED: Sildenafil is  on his medication list.  He understands that he should never use nitroglycerin or nitroglycerin containing products within 24 hours of taking this medication.        Medication Adjustments/Labs and Tests Ordered: Current medicines are reviewed at length with the patient today.  Concerns regarding medicines are outlined above.  Orders Placed This Encounter  Procedures   Comprehensive metabolic panel   Lipid panel     Meds ordered this encounter  Medications   metoprolol succinate (TOPROL-XL) 25 MG 24 hr tablet    Sig: Take 1 tablet (25 mg total) by mouth daily. Take with or immediately following a meal.    Dispense:  90 tablet    Refill:  3   nicotine (NICODERM CQ - DOSED IN MG/24 HOURS) 21 mg/24hr patch    Sig: Place 1 patch (21 mg total) onto the skin daily.    Dispense:  28 patch    Refill:  0      Patient Instructions  Medication Instructions:  Dr. Royann Shivers recommends Repatha (PCSK9). This is an injectable cholesterol medication. This medication will need prior approval with your insurance company, which we will work on. If the medication is not approved initially, we may need to do an appeal with your insurance. We will keep you updated on this process. This medication can be provided at some local pharmacies or be shipped to you from a specialty pharmacy.   *If you need a refill on your cardiac medications before your next appointment, please call your pharmacy*   Lab Work: Your provider would like for you to have the following labs today: fasting lipid and CMET  If you have labs (blood work) drawn today and your tests are completely normal, you will receive your results only by: MyChart Message (if you have MyChart) OR A paper copy in the mail If you have any lab test that is abnormal or we need to change your treatment, we will call you to review the results.   Testing/Procedures: None ordered   Follow-Up: At [facility], you and your health needs are our  priority.  As part of our continuing mission to provide you with exceptional heart care, we have created designated Provider Care Teams.  These Care Teams include your primary Cardiologist (physician) and Advanced Practice Providers (APPs -  Physician Assistants and Nurse Practitioners) who all work together to provide you with the care you need, when you need it.  We recommend signing up for the patient portal called "MyChart".  Sign up information is provided on this After Visit Summary.  MyChart is used to connect with patients for Virtual Visits (Telemedicine).  Patients are able to view lab/test results, encounter notes, upcoming appointments, etc.  Non-urgent messages can be sent to your provider as well.   To learn more about what you can do with MyChart, go to [email or web address].    Your next appointment:   6 month(s)  The format for your next appointment:   In Person  Provider:   Thurmon Fair, MD:1}   Appointment with pharmD to discuss Repatha.     Signed, Corey Fair, MD  03/14/2021 9:05 AM    [facility]

## 2021-04-05 ENCOUNTER — Encounter: Payer: Self-pay | Admitting: Cardiovascular Disease

## 2021-04-05 ENCOUNTER — Ambulatory Visit: Payer: Managed Care, Other (non HMO)

## 2021-04-05 MED ORDER — SILDENAFIL CITRATE 50 MG PO TABS: 50.0000 mg | ORAL_TABLET | Freq: Every day | ORAL | 3 refills | Status: DC | PRN

## 2021-04-11 ENCOUNTER — Other Ambulatory Visit: Payer: Self-pay | Admitting: Nurse Practitioner

## 2021-04-11 DIAGNOSIS — E559 Vitamin D deficiency, unspecified: Secondary | ICD-10-CM

## 2021-04-11 DIAGNOSIS — F419 Anxiety disorder, unspecified: Secondary | ICD-10-CM

## 2021-04-11 MED ORDER — ALPRAZOLAM 1 MG PO TABS: 1.0000 mg | ORAL_TABLET | Freq: Three times a day (TID) | ORAL | 0 refills | Status: AC | PRN

## 2021-04-11 MED ORDER — ALPRAZOLAM 1 MG PO TABS: 1.0000 mg | ORAL_TABLET | Freq: Three times a day (TID) | ORAL | 0 refills | Status: DC | PRN

## 2021-04-25 ENCOUNTER — Ambulatory Visit: Payer: Managed Care, Other (non HMO)

## 2021-05-16 ENCOUNTER — Other Ambulatory Visit: Payer: Self-pay | Admitting: Cardiovascular Disease

## 2021-05-17 ENCOUNTER — Other Ambulatory Visit: Payer: Self-pay

## 2021-05-17 ENCOUNTER — Ambulatory Visit: Payer: Managed Care, Other (non HMO) | Admitting: Pharmacist

## 2021-05-17 DIAGNOSIS — I10 Essential (primary) hypertension: Secondary | ICD-10-CM | POA: Diagnosis not present

## 2021-05-17 DIAGNOSIS — I2510 Atherosclerotic heart disease of native coronary artery without angina pectoris: Secondary | ICD-10-CM

## 2021-05-17 DIAGNOSIS — E782 Mixed hyperlipidemia: Secondary | ICD-10-CM | POA: Diagnosis not present

## 2021-05-17 MED ORDER — METOPROLOL TARTRATE 25 MG PO TABS: 12.5000 mg | ORAL_TABLET | Freq: Two times a day (BID) | ORAL | 2 refills | Status: AC

## 2021-05-17 NOTE — Patient Instructions (Addendum)
It was nice meeting you today  We would like your LDL (bad cholesterol) to be less than 70  Once you feel better, we recommend starting a medication that you will inject every 2 weeks  When you are ready to start, please give [place] a call and we will complete the prior authorization for you and send it to your pharmacy  We can check your cholesterol panel today   Please call with any questions!  Laural Golden, PharmD, BCACP, CDCES, CPP [address], [address] [place], [place], [address] Phone: [phone], Fax: [phone]

## 2021-05-17 NOTE — Progress Notes (Signed)
Patient ID: Corey Salas                 DOB: [birth year]                    MRN: [id number]     HPI: Corey Salas is a 61 y.o. male patient referred to lipid clinic by Dr Royann Shivers. PMH is significant for CAD, HLD, smoking, HTN, and PTSD.  Patient presents today for Pharmd appointment.  Rescheduled last 2 visits because he has felt sick. Has been dry heaving with stomach upset through the month of December.  Discovered he was taking metoprolol succinate and he reports he can not take extended release tablets as they make him sick.  Switched back to metoprolol tartrate.    Discontinued all medications except for metoprolol tartrate and plavix to see what was causing his stomach upset.  Reports a heart healthy diet. Eats on fresh proteins, vegetables, and beans. Believes his cholesterol has reduced due to diet changes and requests a repeat lipid panel.  Does not want to start any new medications until he feels better.  Current Medications: N/a  Intolerances:  Atorvastatin Rosuvastatin  Risk Factors:  CAD Smoking HTN Hc of CABG  LDL goal: <70  Labs: TC 192, Trigs 126, HDL 55, LDL 115 (03/14/21)  Past Medical History:  Diagnosis Date   Anginal pain (HCC)    CAD (coronary artery disease)    Chest pain    Dyspnea    08/23/20- "just before my heart attack, it is normal now, unless I I am stressed/"   Family history of adverse reaction to anesthesia 08/21/2020   Hypertension    Neuromuscular disorder (HCC)    neuropathy in both feet   Neuropathy    "bottom of my feet, they say it is from alcohol, I just drink beer now, not spirits."   PTSD (post-traumatic stress disorder)    PTSD (post-traumatic stress disorder)    STEMI (ST elevation myocardial infarction) (HCC) 08/06/2020   Stroke (HCC)    08/23/20 slight weakness in right hand - can type and write- "I have to concentrate to keep pen in hand."    Current Outpatient Medications on File Prior to Visit  Medication Sig  Dispense Refill   ALPRAZolam (XANAX) 1 MG tablet Take 1 tablet (1 mg total) by mouth every 8 (eight) hours as needed for anxiety. 30 tablet 0   aspirin EC 81 MG tablet Take 1 tablet (81 mg total) by mouth daily. Swallow whole. 90 tablet 3   clopidogrel (PLAVIX) 75 MG tablet Take 1 tablet (75 mg total) by mouth daily. 30 tablet 4   metoprolol succinate (TOPROL-XL) 25 MG 24 hr tablet Take 1 tablet (25 mg total) by mouth daily. Take with or immediately following a meal. 90 tablet 3   Multiple Vitamins-Minerals (CENTRUM SILVER 50+MEN) TABS Take 1 tablet by mouth daily.     nicotine (NICODERM CQ - DOSED IN MG/24 HOURS) 21 mg/24hr patch Place 1 patch (21 mg total) onto the skin daily. 28 patch 0   pregabalin (LYRICA) 25 MG capsule Take 1 capsule (25 mg total) by mouth 2 (two) times daily. 90 capsule 3   sildenafil (VIAGRA) 50 MG tablet TAKE ONE TABLET BY MOUTH DAILY AS NEEDED FOR ERECTILE DYSFUNCTION 10 tablet 3   No current facility-administered medications on file prior to visit.    Allergies  Allergen Reactions   Sertraline     Other reaction(s): Other Patient stated severe  reaction, including suicidal ideations   Atorvastatin     Swelling  "blew up" all over, hospitalized (2018 in [place])     Rosuvastatin Diarrhea    [place]rest[place]   Pt stopped taking ~ end of April 2022,  diarrhea resolved ~36 hours later.  Cardiologist Dr. Royann Shivers is aware he is not taking and will re-address lipid treatment after vascular procedures completed.     Assessment/Plan:  1. Hyperlipidemia - Patient most recent LDL 115 which is above goal of <70. Patient would like it to be rechecked today.  Patient upset that he was on metoprolol succinate because he reports he is intolerant to extended release medications. Requests it be taken off his medication list and it be switched back to metoprolol tartrate.  Discussed mechanism of action of PCSK9i. Using demo pen,  counseled patient on storage, site selection, and administration.  Patient voiced understanding but does not want to start until he feels better.  Will call back if/when he is ready to start.  Laural Golden, PharmD, BCACP, CDCES, CPP [address] [place], [place], [address] Phone: [phone], Fax: [phone]

## 2021-05-18 ENCOUNTER — Encounter: Payer: Self-pay | Admitting: Cardiovascular Disease

## 2021-05-18 LAB — LIPID PANEL
Chol/HDL Ratio: 3.4 ratio (ref 0.0–5.0)
Cholesterol, Total: 183 mg/dL (ref 100–199)
HDL: 54 mg/dL (ref >39)
LDL Chol Calc (NIH): 103 mg/dL — ABNORMAL HIGH (ref 0–99)
Triglycerides: 151 mg/dL — ABNORMAL HIGH (ref 0–149)
VLDL Cholesterol Cal: 26 mg/dL (ref 5–40)

## 2021-05-21 ENCOUNTER — Other Ambulatory Visit: Payer: Self-pay | Admitting: Cardiovascular Disease

## 2021-05-28 ENCOUNTER — Other Ambulatory Visit: Payer: Self-pay

## 2021-05-28 ENCOUNTER — Encounter: Payer: Self-pay | Admitting: Nurse Practitioner

## 2021-05-28 ENCOUNTER — Ambulatory Visit (INDEPENDENT_AMBULATORY_CARE_PROVIDER_SITE_OTHER): Payer: Managed Care, Other (non HMO) | Admitting: Nurse Practitioner

## 2021-05-28 VITALS — BP 150/98 | HR 78 | Temp 98.1°F | Ht 71.0 in | Wt 176.2 lb

## 2021-05-28 DIAGNOSIS — Z1211 Encounter for screening for malignant neoplasm of colon: Secondary | ICD-10-CM

## 2021-05-28 DIAGNOSIS — Z720 Tobacco use: Secondary | ICD-10-CM

## 2021-05-28 DIAGNOSIS — Z1159 Encounter for screening for other viral diseases: Secondary | ICD-10-CM

## 2021-05-28 DIAGNOSIS — Z0000 Encounter for general adult medical examination without abnormal findings: Secondary | ICD-10-CM | POA: Diagnosis not present

## 2021-05-28 DIAGNOSIS — E782 Mixed hyperlipidemia: Secondary | ICD-10-CM

## 2021-05-28 DIAGNOSIS — I25111 Atherosclerotic heart disease of native coronary artery with angina pectoris with documented spasm: Secondary | ICD-10-CM | POA: Diagnosis not present

## 2021-05-28 DIAGNOSIS — K921 Melena: Secondary | ICD-10-CM

## 2021-05-28 DIAGNOSIS — I119 Hypertensive heart disease without heart failure: Secondary | ICD-10-CM | POA: Diagnosis not present

## 2021-05-28 DIAGNOSIS — I10 Essential (primary) hypertension: Secondary | ICD-10-CM

## 2021-05-28 DIAGNOSIS — F1010 Alcohol abuse, uncomplicated: Secondary | ICD-10-CM

## 2021-05-28 DIAGNOSIS — Z8673 Personal history of transient ischemic attack (TIA), and cerebral infarction without residual deficits: Secondary | ICD-10-CM

## 2021-05-28 DIAGNOSIS — R3911 Hesitancy of micturition: Secondary | ICD-10-CM

## 2021-05-28 LAB — POCT URINALYSIS DIPSTICK
Blood, UA: NEGATIVE
Glucose, UA: NEGATIVE
Leukocytes, UA: NEGATIVE
Nitrite, UA: NEGATIVE
Protein, UA: POSITIVE — AB
Spec Grav, UA: 1.025 (ref 1.010–1.025)
Urobilinogen, UA: 4 E.U./dL — AB
pH, UA: 6.5 (ref 5.0–8.0)

## 2021-05-28 NOTE — Patient Instructions (Addendum)
Health Maintenance, Male °Adopting a healthy lifestyle and getting preventive care are important in promoting health and wellness. Ask your health care provider about: °The right schedule for you to have regular tests and exams. °Things you can do on your own to prevent diseases and keep yourself healthy. °What should I know about diet, weight, and exercise? °Eat a healthy diet ° °Eat a diet that includes plenty of vegetables, fruits, low-fat dairy products, and lean protein. °Do not eat a lot of foods that are high in solid fats, added sugars, or sodium. °Maintain a healthy weight °Body mass index (BMI) is a measurement that can be used to identify possible weight problems. It estimates body fat based on height and weight. Your health care provider can help determine your BMI and help you achieve or maintain a healthy weight. °Get regular exercise °Get regular exercise. This is one of the most important things you can do for your health. Most adults should: °Exercise for at least 150 minutes each week. The exercise should increase your heart rate and make you sweat (moderate-intensity exercise). °Do strengthening exercises at least twice a week. This is in addition to the moderate-intensity exercise. °Spend less time sitting. Even light physical activity can be beneficial. °Watch cholesterol and blood lipids °Have your blood tested for lipids and cholesterol at 61 years of age, then have this test every 5 years. °You may need to have your cholesterol levels checked more often if: °Your lipid or cholesterol levels are high. °You are older than 61 years of age. °You are at high risk for heart disease. °What should I know about cancer screening? °Many types of cancers can be detected early and may often be prevented. Depending on your health history and family history, you may need to have cancer screening at various ages. This may include screening for: °Colorectal cancer. °Prostate cancer. °Skin cancer. °Lung  cancer. °What should I know about heart disease, diabetes, and high blood pressure? °Blood pressure and heart disease °High blood pressure causes heart disease and increases the risk of stroke. This is more likely to develop in people who have high blood pressure readings or are overweight. °Talk with your health care provider about your target blood pressure readings. °Have your blood pressure checked: °Every 3-5 years if you are 18-39 years of age. °Every year if you are 40 years old or older. °If you are between the ages of 65 and 75 and are a current or former smoker, ask your health care provider if you should have a one-time screening for abdominal aortic aneurysm (AAA). °Diabetes °Have regular diabetes screenings. This checks your fasting blood sugar level. Have the screening done: °Once every three years after age 45 if you are at a normal weight and have a low risk for diabetes. °More often and at a younger age if you are overweight or have a high risk for diabetes. °What should I know about preventing infection? °Hepatitis B °If you have a higher risk for hepatitis B, you should be screened for this virus. Talk with your health care provider to find out if you are at risk for hepatitis B infection. °Hepatitis C °Blood testing is recommended for: °Everyone born from 1945 through 1965. °Anyone with known risk factors for hepatitis C. °Sexually transmitted infections (STIs) °You should be screened each year for STIs, including gonorrhea and chlamydia, if: °You are sexually active and are younger than 61 years of age. °You are older than 61 years of age and your   health care provider tells you that you are at risk for this type of infection. Your sexual activity has changed since you were last screened, and you are at increased risk for chlamydia or gonorrhea. Ask your health care provider if you are at risk. Ask your health care provider about whether you are at high risk for HIV. Your health care provider  may recommend a prescription medicine to help prevent HIV infection. If you choose to take medicine to prevent HIV, you should first get tested for HIV. You should then be tested every 3 months for as long as you are taking the medicine. Follow these instructions at home: Alcohol use Do not drink alcohol if your health care provider tells you not to drink. If you drink alcohol: Limit how much you have to 0-2 drinks a day. Know how much alcohol is in your drink. In the [place] one drink equals one 12 oz bottle of beer (355 mL), one 5 oz glass of wine (148 mL), or one 1 oz glass of hard liquor (44 mL). Lifestyle Do not use any products that contain nicotine or tobacco. These products include cigarettes, chewing tobacco, and vaping devices, such as e-cigarettes. If you need help quitting, ask your health care provider. Do not use street drugs. Do not share needles. Ask your health care provider for help if you need support or information about quitting drugs. General instructions Schedule regular health, dental, and eye exams. Stay current with your vaccines. Tell your health care provider if: You often feel depressed. You have ever been abused or do not feel safe at home. Summary Adopting a healthy lifestyle and getting preventive care are important in promoting health and wellness. Follow your health care provider's instructions about healthy diet, exercising, and getting tested or screened for diseases. Follow your health care provider's instructions on monitoring your cholesterol and blood pressure. This information is not intended to replace advice given to you by your health care provider. Make sure you discuss any questions you have with your health care provider. Document Revised: 09/10/2020 Document Reviewed: 09/10/2020 Elsevier Patient Education  2022 [place] a blood pressure log

## 2021-05-28 NOTE — Progress Notes (Signed)
I,Victoria T Hamilton,acting as a Neurosurgeonscribe for Arnette FeltsJanece Bernardette Waldron, FNP.,have documented all relevant documentation on the behalf of Arnette FeltsJanece Ivannia Willhelm, FNP,as directed by  Arnette FeltsJanece Natalye Kott, FNP while in the presence of Arnette FeltsJanece Africa Masaki, FNP.   This visit occurred during the SARS-CoV-2 public health emergency. Safety protocols were in place, including screening questions prior to the visit, additional usage of staff PPE, and extensive cleaning of exam room while observing appropriate contact time as indicated for disinfecting solutions.  Subjective:     Patient ID: Corey Salas , male    DOB: 10-13-1960 , 61 y.o.   MRN: [id number]   Chief Complaint  Patient presents with   Annual Exam    HPI  Pt presents today for HM.  Pt reports he cannot take extended release tablets "EVER". Cardiologist Dr. Jomarie Longsroituru switched Rx. Pt still sees him currently, has not followed up.   Pt reports he did not have a good holiday going through excruciating pain.    He is followed by Dr .Jomarie Longsroituru for Cardiology - due to follow up - he is back on metoprolol tartrate. Blood pressure at home is 135/95.  He reports lidocaine patches are working and he is not taking lyrica. He says he has severe neuropathy.  He states it is more about quality of wife vs quantity. When he noticed he was on an ER tablet he stopped taking. He reports he cut his alcohol in half. He reports red wine was helping with his stomach pain and is at 90% where he was and 1/2 of smoking.   He had a stroke in April 2022, stent placed to his left neck. He reports his speech is find. He is originally from [place]. He also had a MI April 2022.  His cardiologist would like for him to be 100%.    Hypertension This is a chronic problem. The current episode started more than 1 year ago. The problem is uncontrolled. Pertinent negatives include no anxiety or peripheral edema. There are no associated agents to hypertension. Risk factors for coronary artery disease include  sedentary lifestyle, male gender and dyslipidemia. Past treatments include beta blockers. There are no compliance problems.     Past Medical History:  Diagnosis Date   Anginal pain (HCC)    CAD (coronary artery disease)    Chest pain    Dyspnea    08/23/20- "just before my heart attack, it is normal now, unless I I am stressed/"   Family history of adverse reaction to anesthesia 08/21/2020   Hypertension    Neuromuscular disorder (HCC)    neuropathy in both feet   Neuropathy    "bottom of my feet, they say it is from alcohol, I just drink beer now, not spirits."   PTSD (post-traumatic stress disorder)    PTSD (post-traumatic stress disorder)    STEMI (ST elevation myocardial infarction) (HCC) 08/06/2020   Stroke (HCC)    08/23/20 slight weakness in right hand - can type and write- "I have to concentrate to keep pen in hand."     Family History  Problem Relation Age of Onset   Heart attack Mother    Heart attack Father      Current Outpatient Medications:    ALPRAZolam (XANAX) 1 MG tablet, Take 1 tablet (1 mg total) by mouth every 8 (eight) hours as needed for anxiety., Disp: 30 tablet, Rfl: 0   clopidogrel (PLAVIX) 75 MG tablet, TAKE ONE TABLET BY MOUTH DAILY, Disp: 30 tablet, Rfl: 6   metoprolol tartrate (  LOPRESSOR) 25 MG tablet, Take 0.5 tablets (12.5 mg total) by mouth 2 (two) times daily., Disp: 90 tablet, Rfl: 2   nicotine (NICODERM CQ - DOSED IN MG/24 HOURS) 21 mg/24hr patch, Place 1 patch (21 mg total) onto the skin daily., Disp: 28 patch, Rfl: 0   sildenafil (VIAGRA) 50 MG tablet, TAKE ONE TABLET BY MOUTH DAILY AS NEEDED FOR ERECTILE DYSFUNCTION, Disp: 10 tablet, Rfl: 3   aspirin EC 81 MG tablet, Take 1 tablet (81 mg total) by mouth daily. Swallow whole. (Patient not taking: Reported on 05/28/2021), Disp: 90 tablet, Rfl: 3   Multiple Vitamins-Minerals (CENTRUM SILVER 50+MEN) TABS, Take 1 tablet by mouth daily. (Patient not taking: Reported on 05/28/2021), Disp: , Rfl:     Allergies  Allergen Reactions   Sertraline     Other reaction(s): Other Patient stated severe reaction, including suicidal ideations   Atorvastatin     Swelling  "blew up" all over, hospitalized (2018 in [place])     Rosuvastatin Diarrhea    [place]rest[place]   Pt stopped taking ~ end of April 2022,  diarrhea resolved ~36 hours later.  Cardiologist Dr. Royann Shivers is aware he is not taking and will re-address lipid treatment after vascular procedures completed.    Toprol Xl [Metoprolol] Other (See Comments)    Reports nausea and stomach upset     Men's preventive visit. Patient Health Questionnaire (PHQ-2) is  Flowsheet Row Office Visit from 01/24/2021 in [facility]  PHQ-2 Total Score 2      Patient is on a regular diet; he is trying to eat healthy.  Marital status: Married. Relevant history for alcohol use is:  Social History   Substance and Sexual Activity  Alcohol Use Yes   Alcohol/week: 48.0 standard drinks   Types: 48 Cans of beer per week   Relevant history for tobacco use is:  Social History   Tobacco Use  Smoking Status Every Day   Packs/day: 0.50   Years: 49.00   Pack years: 24.50   Types: Cigarettes  Smokeless Tobacco Never  Tobacco Comments   Patient rolls his cigarettes- equaliavant to 1 pakg a day.   .   Review of Systems  Constitutional: Negative.   HENT: Negative.    Eyes: Negative.   Respiratory: Negative.    Cardiovascular: Negative.   Gastrointestinal: Negative.   Endocrine: Negative.   Genitourinary: Negative.   Musculoskeletal: Negative.   Allergic/Immunologic: Negative.   Neurological: Negative.   Hematological: Negative.   Psychiatric/Behavioral: Negative.      Today's Vitals   05/28/21 0950 05/28/21 1047  BP: (!) 162/100 (!) 150/98  Pulse: 72 78  Temp: 98.1 F (36.7 C)   Weight: 176 lb 3.2 oz (79.9 kg)   Height: 5\' 11"  (1.803 m)    Body mass index is 24.57 kg/m.   Wt Readings from Last 3 Encounters:  05/28/21 176 lb 3.2 oz (79.9 kg)  03/14/21 178 lb 12.8 oz (81.1 kg)  01/29/21 173 lb (78.5 kg)    Objective:  Physical Exam Vitals reviewed.  Constitutional:      General: He is not in acute distress.    Appearance: Normal appearance. He is obese.  HENT:     Head: Normocephalic and atraumatic.     Right Ear: Tympanic membrane, ear canal and external ear normal. There is no impacted cerumen.     Left Ear: Tympanic membrane, ear canal and external ear normal. There is no impacted  cerumen.  Cardiovascular:     Rate and Rhythm: Normal rate and regular rhythm.     Pulses: Normal pulses.     Heart sounds: Normal heart sounds. No murmur heard. Pulmonary:     Effort: Pulmonary effort is normal. No respiratory distress.     Breath sounds: Normal breath sounds.  Abdominal:     General: Abdomen is flat. Bowel sounds are normal. There is no distension.     Palpations: Abdomen is soft.  Genitourinary:    Prostate: Normal.     Rectum: Guaiac result positive.  Musculoskeletal:        General: Normal range of motion.     Cervical back: Normal range of motion and neck supple.  Skin:    General: Skin is warm.     Capillary Refill: Capillary refill takes less than 2 seconds.  Neurological:     General: No focal deficit present.     Mental Status: He is alert and oriented to person, place, and time.     Cranial Nerves: No cranial nerve deficit.  Psychiatric:        Mood and Affect: Mood normal.        Behavior: Behavior normal.        Thought Content: Thought content normal.        Judgment: Judgment normal.        Assessment And Plan:    1. Encounter for health maintenance examination Behavior modifications discussed and diet history reviewed.   Pt will continue to exercise regularly and modify diet with low GI, plant based foods and decrease intake of processed foods.  Recommend intake of daily multivitamin, Vitamin D, and calcium.  Recommend  colonoscopy for preventive screenings, as well as recommend immunizations that include influenza, TDAP, and Shingles  2. Essential hypertension Blood pressure is elevated recheck is improved. - POCT Urinalysis Dipstick (81002) - Microalbumin / creatinine urine ratio - CBC - Hemoglobin A1c - PSA - EKG 12-Lead  3. Mixed hyperlipidemia - POCT Urinalysis Dipstick (81002) - Microalbumin / creatinine urine ratio - CBC - Hemoglobin A1c - PSA  4. Coronary artery disease involving native coronary artery of native heart with angina pectoris with documented spasm (HCC)  5. Blood in stool Comments: positive guaic in office, will refer to GI for evaluation. Also has a history of alcohol abuse.  6. Tobacco abuse  7. History of stroke  8. Alcohol abuse  9. Urinary hesitancy Comments: Will check PSA.  - Culture, Urine  10. Encounter for screening colonoscopy - Ambulatory referral to Gastroenterology  11. Encounter for hepatitis C screening test for low risk patient - Hepatitis C antibody     Patient was given opportunity to ask questions. Patient verbalized understanding of the plan and was able to repeat key elements of the plan. All questions were answered to their satisfaction.   Arnette Felts, FNP   I, Arnette Felts, FNP, have reviewed all documentation for this visit. The documentation on 06/12/21 for the exam, diagnosis, procedures, and orders are all accurate and complete.  THE PATIENT IS ENCOURAGED TO PRACTICE SOCIAL DISTANCING DUE TO THE COVID-19 PANDEMIC.

## 2021-05-29 LAB — CBC
Hematocrit: 50.6 % (ref 37.5–51.0)
Hemoglobin: 18.4 g/dL — ABNORMAL HIGH (ref 13.0–17.7)
MCH: 37.7 pg — ABNORMAL HIGH (ref 26.6–33.0)
MCHC: 36.4 g/dL — ABNORMAL HIGH (ref 31.5–35.7)
MCV: 104 fL — ABNORMAL HIGH (ref 79–97)
Platelets: 187 10*3/uL (ref 150–450)
RBC: 4.88 x10E6/uL (ref 4.14–5.80)
RDW: 12.4 % (ref 11.6–15.4)
WBC: 7.7 10*3/uL (ref 3.4–10.8)

## 2021-05-29 LAB — MICROALBUMIN / CREATININE URINE RATIO
Creatinine, Urine: 291.9 mg/dL
Microalb/Creat Ratio: 7 mg/g creat (ref 0–29)
Microalbumin, Urine: 19.7 ug/mL

## 2021-05-29 LAB — URINE CULTURE

## 2021-05-29 LAB — HEMOGLOBIN A1C
Est. average glucose Bld gHb Est-mCnc: 108 mg/dL
Hgb A1c MFr Bld: 5.4 % (ref 4.8–5.6)

## 2021-05-29 LAB — PSA: Prostate Specific Ag, Serum: 1.1 ng/mL (ref 0.0–4.0)

## 2021-05-29 LAB — HEPATITIS C ANTIBODY: Hep C Virus Ab: <0.1 s/co ratio (ref 0.0–0.9)

## 2021-06-07 ENCOUNTER — Encounter: Payer: Self-pay | Admitting: Nurse Practitioner

## 2021-06-23 ENCOUNTER — Other Ambulatory Visit: Payer: Self-pay | Admitting: Cardiovascular Disease

## 2021-06-24 ENCOUNTER — Encounter: Payer: Self-pay | Admitting: Cardiovascular Disease

## 2021-06-30 ENCOUNTER — Encounter: Payer: Self-pay | Admitting: Nurse Practitioner

## 2021-07-12 ENCOUNTER — Other Ambulatory Visit: Payer: Self-pay | Admitting: Cardiovascular Disease

## 2021-07-17 ENCOUNTER — Other Ambulatory Visit: Payer: Self-pay | Admitting: Nurse Practitioner

## 2021-08-06 ENCOUNTER — Encounter: Payer: Self-pay | Admitting: Nurse Practitioner

## 2021-08-27 ENCOUNTER — Encounter: Payer: Self-pay | Admitting: Nurse Practitioner

## 2021-08-27 ENCOUNTER — Ambulatory Visit: Payer: Managed Care, Other (non HMO) | Admitting: Nurse Practitioner

## 2021-08-27 VITALS — BP 140/88 | HR 82 | Temp 98.1°F | Ht 71.0 in | Wt 171.8 lb

## 2021-08-27 DIAGNOSIS — R1084 Generalized abdominal pain: Secondary | ICD-10-CM | POA: Diagnosis not present

## 2021-08-27 DIAGNOSIS — I25111 Atherosclerotic heart disease of native coronary artery with angina pectoris with documented spasm: Secondary | ICD-10-CM | POA: Diagnosis not present

## 2021-08-27 DIAGNOSIS — I119 Hypertensive heart disease without heart failure: Secondary | ICD-10-CM

## 2021-08-27 DIAGNOSIS — E782 Mixed hyperlipidemia: Secondary | ICD-10-CM | POA: Diagnosis not present

## 2021-08-27 DIAGNOSIS — I10 Essential (primary) hypertension: Secondary | ICD-10-CM

## 2021-08-27 NOTE — Progress Notes (Addendum)
I,Victoria T Hamilton,acting as a Education administrator for Minette Brine, FNP.,have documented all relevant documentation on the behalf of Minette Brine, FNP,as directed by  Minette Brine, FNP while in the presence of Minette Brine, [place].  This visit occurred during the SARS-CoV-2 public health emergency.  Safety protocols were in place, including screening questions prior to the visit, additional usage of staff PPE, and extensive cleaning of exam room while observing appropriate contact time as indicated for disinfecting solutions.  Subjective:     Patient ID: Corey Salas , male    DOB: Nov 01, 1960 , 61 y.o.   MRN: [id number]   Chief Complaint  Patient presents with   Hypertension   Hyperlipidemia    HPI  Pt presents for blood pressure f/u. He states he has been taken off plavix & beta blocker by his Cardiologist since he is going to see GI in May 8th to make sure there is not an interaction. GI also wants to further evaluate his stomach issues since off of those two medications for now.   He reports cutting down on smoking, using nicotine patch in between.   Blood pressure at home ranges 125/84, he is not taking any medications to statin (lipitor - had been taking but had a reaction). He has been controlling with his diet. He is also not taking aspirin reports irritates his stomach even with enteric coated.       Past Medical History:  Diagnosis Date   Anginal pain ([facility])    CAD (coronary artery disease)    Chest pain    Dyspnea    08/23/20- "just before my heart attack, it is normal now, unless I I am stressed/"   Family history of adverse reaction to anesthesia 08/21/2020   Hypertension    Neuromuscular disorder (HCC)    neuropathy in both feet   Neuropathy    "bottom of my feet, they say it is from alcohol, I just drink beer now, not spirits."   PTSD (post-traumatic stress disorder)    PTSD (post-traumatic stress disorder)    STEMI (ST elevation myocardial infarction) ([facility]) 08/06/2020    Stroke ([facility])    08/23/20 slight weakness in right hand - can type and write- "I have to concentrate to keep pen in hand."     Family History  Problem Relation Age of Onset   Heart attack Mother    Heart attack Father      Current Outpatient Medications:    ALPRAZolam (XANAX) 1 MG tablet, Take 1 tablet (1 mg total) by mouth every 8 (eight) hours as needed for anxiety., Disp: 30 tablet, Rfl: 0   nicotine (NICODERM CQ - DOSED IN MG/24 HOURS) 21 mg/24hr patch, Place 1 patch (21 mg total) onto the skin daily., Disp: 28 patch, Rfl: 0   aspirin EC 81 MG tablet, Take 1 tablet (81 mg total) by mouth daily. Swallow whole. (Patient not taking: Reported on 05/28/2021), Disp: 90 tablet, Rfl: 3   clopidogrel (PLAVIX) 75 MG tablet, TAKE ONE TABLET BY MOUTH DAILY (Patient not taking: Reported on 08/27/2021), Disp: 30 tablet, Rfl: 6   metoprolol tartrate (LOPRESSOR) 25 MG tablet, Take 0.5 tablets (12.5 mg total) by mouth 2 (two) times daily. (Patient not taking: Reported on 09/11/2021), Disp: 90 tablet, Rfl: 2   Multiple Vitamins-Minerals (CENTRUM SILVER 50+MEN) TABS, Take 1 tablet by mouth daily. (Patient not taking: Reported on 05/28/2021), Disp: , Rfl:    sildenafil (VIAGRA) 50 MG tablet, TAKE ONE TABLET BY MOUTH DAILY AS NEEDED, Disp:  10 tablet, Rfl: 3   Allergies  Allergen Reactions   Sertraline     Other reaction(s): Other Patient stated severe reaction, including suicidal ideations   Atorvastatin     Swelling  "blew up" all over, hospitalized (2018 in [place])     Rosuvastatin Diarrhea    Crest[place]   Pt stopped taking ~ end of April 2022,  diarrhea resolved ~36 hours later.  Cardiologist Dr. Sallyanne Kuster is aware he is not taking and will re-address lipid treatment after vascular procedures completed.    Toprol Xl [Metoprolol] Other (See Comments)    Reports nausea and stomach upset     Review of Systems  Constitutional: Negative.   HENT: Negative.     Respiratory: Negative.    Gastrointestinal: Negative.   Endocrine: Negative.   Skin: Negative.   Allergic/Immunologic: Negative.   Neurological: Negative.   Psychiatric/Behavioral: Negative.       Today's Vitals   08/27/21 0951 08/27/21 1042  BP: 140/82 140/88  Pulse: 82   Temp: 98.1 F (36.7 C)   SpO2: 98%   Weight: 171 lb 12.8 oz (77.9 kg)   Height: '5\' 11"'  (1.803 m)    Body mass index is 23.96 kg/m.  Wt Readings from Last 3 Encounters:  08/27/21 171 lb 12.8 oz (77.9 kg)  05/28/21 176 lb 3.2 oz (79.9 kg)  03/14/21 178 lb 12.8 oz (81.1 kg)    Objective:  Physical Exam Vitals reviewed.  Constitutional:      General: He is not in acute distress.    Appearance: Normal appearance.  HENT:     Head: Normocephalic and atraumatic.  Cardiovascular:     Rate and Rhythm: Normal rate and regular rhythm.     Pulses: Normal pulses.     Heart sounds: Normal heart sounds. No murmur heard. Pulmonary:     Effort: Pulmonary effort is normal. No respiratory distress.     Breath sounds: Normal breath sounds.  Abdominal:     General: Abdomen is flat. Bowel sounds are normal. There is no distension.     Palpations: Abdomen is soft.     Tenderness: There is no abdominal tenderness.  Musculoskeletal:        General: Normal range of motion.     Cervical back: Normal range of motion and neck supple.  Skin:    General: Skin is warm.     Capillary Refill: Capillary refill takes less than 2 seconds.  Neurological:     General: No focal deficit present.     Mental Status: He is alert and oriented to person, place, and time.     Cranial Nerves: No cranial nerve deficit.     Motor: No weakness.  Psychiatric:        Mood and Affect: Mood normal.        Behavior: Behavior normal.        Thought Content: Thought content normal.        Judgment: Judgment normal.         Assessment And Plan:     1. Hypertensive heart disease without congestive heart failure Comments: Reports blood  pressure is always high at doctor appts - CMP14+EGFR  2. Mixed hyperlipidemia Comments: Stable, tolerating statin well.  - Lipid panel  3. Generalized abdominal pain Comments: Continues to have abdomen pain will check for H Pylori - H Pylori, IGM, IGG, IGA AB  4. Coronary artery disease involving native coronary artery of native heart  with angina pectoris with documented spasm [facility])     Patient was given opportunity to ask questions. Patient verbalized understanding of the plan and was able to repeat key elements of the plan. All questions were answered to their satisfaction.  Minette Brine, FNP    I, Minette Brine, FNP, have reviewed all documentation for this visit. The documentation on 08/27/21 for the exam, diagnosis, procedures, and orders are all accurate and complete.   IF YOU HAVE BEEN REFERRED TO A SPECIALIST, IT MAY TAKE 1-2 WEEKS TO SCHEDULE/PROCESS THE REFERRAL. IF YOU HAVE NOT HEARD FROM US/SPECIALIST IN TWO WEEKS, PLEASE GIVE [place] A CALL AT [phone].   THE PATIENT IS ENCOURAGED TO PRACTICE SOCIAL DISTANCING DUE TO THE COVID-19 PANDEMIC.

## 2021-08-27 NOTE — Patient Instructions (Signed)
Hypertension, Adult ?Hypertension is another name for high blood pressure. High blood pressure forces your heart to work harder to pump blood. This can cause problems over time. ?There are two numbers in a blood pressure reading. There is a top number (systolic) over a bottom number (diastolic). It is best to have a blood pressure that is below 120/80. ?What are the causes? ?The cause of this condition is not known. Some other conditions can lead to high blood pressure. ?What increases the risk? ?Some lifestyle factors can make you more likely to develop high blood pressure: ?Smoking. ?Not getting enough exercise or physical activity. ?Being overweight. ?Having too much fat, sugar, calories, or salt (sodium) in your diet. ?Drinking too much alcohol. ?Other risk factors include: ?Having any of these conditions: ?Heart disease. ?Diabetes. ?High cholesterol. ?Kidney disease. ?Obstructive sleep apnea. ?Having a family history of high blood pressure and high cholesterol. ?Age. The risk increases with age. ?Stress. ?What are the signs or symptoms? ?High blood pressure may not cause symptoms. Very high blood pressure (hypertensive crisis) may cause: ?Headache. ?Fast or uneven heartbeats (palpitations). ?Shortness of breath. ?Nosebleed. ?Vomiting or feeling like you may vomit (nauseous). ?Changes in how you see. ?Very bad chest pain. ?Feeling dizzy. ?Seizures. ?How is this treated? ?This condition is treated by making healthy lifestyle changes, such as: ?Eating healthy foods. ?Exercising more. ?Drinking less alcohol. ?Your doctor may prescribe medicine if lifestyle changes do not help enough and if: ?Your top number is above 130. ?Your bottom number is above 80. ?Your personal target blood pressure may vary. ?Follow these instructions at home: ?Eating and drinking ? ?If told, follow the DASH eating plan. To follow this plan: ?Fill one half of your plate at each meal with fruits and vegetables. ?Fill one fourth of your plate  at each meal with whole grains. Whole grains include whole-wheat pasta, brown rice, and whole-grain bread. ?Eat or drink low-fat dairy products, such as skim milk or low-fat yogurt. ?Fill one fourth of your plate at each meal with low-fat (lean) proteins. Low-fat proteins include fish, chicken without skin, eggs, beans, and tofu. ?Avoid fatty meat, cured and processed meat, or chicken with skin. ?Avoid pre-made or processed food. ?Limit the amount of salt in your diet to less than 1,500 mg each day. ?Do not drink alcohol if: ?Your doctor tells you not to drink. ?You are pregnant, may be pregnant, or are planning to become pregnant. ?If you drink alcohol: ?Limit how much you have to: ?0-1 drink a day for women. ?0-2 drinks a day for men. ?Know how much alcohol is in your drink. In the [place] one drink equals one 12 oz bottle of beer (355 mL), one 5 oz glass of wine (148 mL), or one 1? oz glass of hard liquor (44 mL). ?Lifestyle ? ?Work with your doctor to stay at a healthy weight or to lose weight. Ask your doctor what the best weight is for you. ?Get at least 30 minutes of exercise that causes your heart to beat faster (aerobic exercise) most days of the week. This may include walking, swimming, or biking. ?Get at least 30 minutes of exercise that strengthens your muscles (resistance exercise) at least 3 days a week. This may include lifting weights or doing Pilates. ?Do not smoke or use any products that contain nicotine or tobacco. If you need help quitting, ask your doctor. ?Check your blood pressure at home as told by your doctor. ?Keep all follow-up visits. ?Medicines ?Take over-the-counter and prescription medicines   only as told by your doctor. Follow directions carefully. ?Do not skip doses of blood pressure medicine. The medicine does not work as well if you skip doses. Skipping doses also puts you at risk for problems. ?Ask your doctor about side effects or reactions to medicines that you should watch  for. ?Contact a doctor if: ?You think you are having a reaction to the medicine you are taking. ?You have headaches that keep coming back. ?You feel dizzy. ?You have swelling in your ankles. ?You have trouble with your vision. ?Get help right away if: ?You get a very bad headache. ?You start to feel mixed up (confused). ?You feel weak or numb. ?You feel faint. ?You have very bad pain in your: ?Chest. ?Belly (abdomen). ?You vomit more than once. ?You have trouble breathing. ?These symptoms may be an emergency. Get help right away. Call 911. ?Do not wait to see if the symptoms will go away. ?Do not drive yourself to the hospital. ?Summary ?Hypertension is another name for high blood pressure. ?High blood pressure forces your heart to work harder to pump blood. ?For most people, a normal blood pressure is less than 120/80. ?Making healthy choices can help lower blood pressure. If your blood pressure does not get lower with healthy choices, you may need to take medicine. ?This information is not intended to replace advice given to you by your health care provider. Make sure you discuss any questions you have with your health care provider. ?Document Revised: 02/07/2021 Document Reviewed: 02/07/2021 ?Elsevier Patient Education ? 2023 [employer] ? ?

## 2021-08-30 ENCOUNTER — Encounter: Payer: Self-pay | Admitting: Nurse Practitioner

## 2021-08-30 LAB — CMP14+EGFR
ALT: 61 IU/L — ABNORMAL HIGH (ref 0–44)
AST: 73 IU/L — ABNORMAL HIGH (ref 0–40)
Albumin/Globulin Ratio: 1.5 (ref 1.2–2.2)
Albumin: 4.4 g/dL (ref 3.8–4.8)
Alkaline Phosphatase: 121 IU/L (ref 44–121)
BUN/Creatinine Ratio: 6 — ABNORMAL LOW (ref 10–24)
BUN: 6 mg/dL — ABNORMAL LOW (ref 8–27)
Bilirubin Total: 0.9 mg/dL (ref 0.0–1.2)
CO2: 21 mmol/L (ref 20–29)
Calcium: 9.8 mg/dL (ref 8.6–10.2)
Chloride: 104 mmol/L (ref 96–106)
Creatinine, Ser: 0.96 mg/dL (ref 0.76–1.27)
Globulin, Total: 3 g/dL (ref 1.5–4.5)
Glucose: 88 mg/dL (ref 70–99)
Potassium: 4.6 mmol/L (ref 3.5–5.2)
Sodium: 140 mmol/L (ref 134–144)
Total Protein: 7.4 g/dL (ref 6.0–8.5)
eGFR: 90 mL/min/{1.73_m2} (ref >59)

## 2021-08-30 LAB — LIPID PANEL
Chol/HDL Ratio: 3.9 ratio (ref 0.0–5.0)
Cholesterol, Total: 200 mg/dL — ABNORMAL HIGH (ref 100–199)
HDL: 51 mg/dL (ref >39)
LDL Chol Calc (NIH): 127 mg/dL — ABNORMAL HIGH (ref 0–99)
Triglycerides: 121 mg/dL (ref 0–149)
VLDL Cholesterol Cal: 22 mg/dL (ref 5–40)

## 2021-08-30 LAB — H PYLORI, IGM, IGG, IGA AB
H pylori, IgM Abs: <9 units (ref 0.0–8.9)
H. pylori, IgA Abs: <9 units (ref 0.0–8.9)
H. pylori, IgG AbS: 0.83 Index Value — ABNORMAL HIGH (ref 0.00–0.79)

## 2021-09-09 ENCOUNTER — Other Ambulatory Visit: Payer: Self-pay | Admitting: Physician Assistant

## 2021-09-09 DIAGNOSIS — R634 Abnormal weight loss: Secondary | ICD-10-CM

## 2021-09-09 DIAGNOSIS — R7989 Other specified abnormal findings of blood chemistry: Secondary | ICD-10-CM

## 2021-09-09 DIAGNOSIS — R109 Unspecified abdominal pain: Secondary | ICD-10-CM

## 2021-09-10 ENCOUNTER — Telehealth: Payer: Self-pay | Admitting: *Deleted

## 2021-09-10 NOTE — Telephone Encounter (Signed)
? ?  Pre-operative Risk Assessment  ?  ?Patient Name: Corey Salas  ?DOB: Nov 05, 1960 ?MRN: [id number]  ? ?  ? ?Request for Surgical Clearance   ? ?Procedure:  colonoscopy and endoscopoy ? ?Date of Surgery:  Clearance 10/16/21                              ?   ?Surgeon:  dr Alessandra Bevels ?Surgeon's Group or Practice Name:  [facility] ?Phone number:  [phone] ?Fax number:  [phone] ?  ?Type of Clearance Requested:   ?- Pharmacy:  Hold Clopidogrel (Plavix) for 3 days prior ?  ?Type of Anesthesia:  propofol ?  ?Additional requests/questions:   ? ?Signed, ?Fredia Beets   ?09/10/2021, 4:44 PM   ?

## 2021-09-11 ENCOUNTER — Telehealth: Payer: Self-pay | Admitting: *Deleted

## 2021-09-11 NOTE — Telephone Encounter (Signed)
Follow Up: ? ? ? ? ?Patient is returning a call. He needs a Designer, industrial/product ?

## 2021-09-11 NOTE — Telephone Encounter (Signed)
S/w the pt today and he is agreeable to plan of care for  tele pre op appt 09/13/21 @ 9 am. Per the pt right  now his medications are on hold until they find out what is causing his symptoms. Only medication right now per the pt is PRN Xanax. Consent has been given. ? ?  ?Patient Consent for Virtual Visit  ? ? ?   ? ?[name] has provided verbal consent on 09/11/2021 for a virtual visit (video or telephone). ? ? ?CONSENT FOR VIRTUAL VISIT FOR:  Corey Salas  ?By participating in this virtual visit I agree to the following: ? ?I hereby voluntarily request, consent and authorize [facility] and its employed or contracted physicians, physician assistants, nurse practitioners or other licensed health care professionals (the Practitioner), to provide me with telemedicine health care services (the ?Services") as deemed necessary by the treating Practitioner. I acknowledge and consent to receive the Services by the Practitioner via telemedicine. I understand that the telemedicine visit will involve communicating with the Practitioner through live audiovisual communication technology and the disclosure of certain medical information by electronic transmission. I acknowledge that I have been given the opportunity to request an in-person assessment or other available alternative prior to the telemedicine visit and am voluntarily participating in the telemedicine visit. ? ?I understand that I have the right to withhold or withdraw my consent to the use of telemedicine in the course of my care at any time, without affecting my right to future care or treatment, and that the Practitioner or I may terminate the telemedicine visit at any time. I understand that I have the right to inspect all information obtained and/or recorded in the course of the telemedicine visit and may receive copies of available information for a reasonable fee.  I understand that some of the potential risks of receiving the Services via  telemedicine include:  ?Delay or interruption in medical evaluation due to technological equipment failure or disruption; ?Information transmitted may not be sufficient (e.g. poor resolution of images) to allow for appropriate medical decision making by the Practitioner; and/or  ?In rare instances, security protocols could fail, causing a breach of personal health information. ? ?Furthermore, I acknowledge that it is my responsibility to provide information about my medical history, conditions and care that is complete and accurate to the best of my ability. I acknowledge that Practitioner's advice, recommendations, and/or decision may be based on factors not within their control, such as incomplete or inaccurate data provided by me or distortions of diagnostic images or specimens that may result from electronic transmissions. I understand that the practice of medicine is not an exact science and that Practitioner makes no warranties or guarantees regarding treatment outcomes. I acknowledge that a copy of this consent can be made available to me via my patient portal [facility] MyChart), or I can request a printed copy by calling the office of [facility].   ? ?I understand that my insurance will be billed for this visit.  ? ?I have read or had this consent read to me. ?I understand the contents of this consent, which adequately explains the benefits and risks of the Services being provided via telemedicine.  ?I have been provided ample opportunity to ask questions regarding this consent and the Services and have had my questions answered to my satisfaction. ?I give my informed consent for the services to be provided through the use of telemedicine in my medical care ? ? ? ?

## 2021-09-11 NOTE — Telephone Encounter (Signed)
Please arrange virtual telephone visit with preop APP 

## 2021-09-11 NOTE — Telephone Encounter (Signed)
OK to hold the plavix for 5 days for the procedure ?

## 2021-09-11 NOTE — Telephone Encounter (Signed)
S/w the pt today and he is agreeable to plan of care for  tele pre op appt 09/13/21 @ 9 am. Per the pt right  now his medications are on hold until they find out what is causing his symptoms. Only medication right now per the pt is PRN Xanax. Consent has been given. ?

## 2021-09-11 NOTE — Telephone Encounter (Signed)
Dr. Royann Shivers to review if ok to hold plavix for 5 days prior to GI procedure as long as he does not have any recent angina.  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  ? ?Please forward your response to P CV DIV PREOP ? ?Plan to set up virtual tele visit with preop APP after Dr. Erin Hearing response ?

## 2021-09-13 ENCOUNTER — Ambulatory Visit (INDEPENDENT_AMBULATORY_CARE_PROVIDER_SITE_OTHER): Payer: Managed Care, Other (non HMO) | Admitting: General Practice

## 2021-09-13 DIAGNOSIS — Z01810 Encounter for preprocedural cardiovascular examination: Secondary | ICD-10-CM

## 2021-09-13 NOTE — Progress Notes (Signed)
? ?Virtual Visit via Telephone Note  ? ?This visit type was conducted due to national recommendations for restrictions regarding the COVID-19 Pandemic (e.g. social distancing) in an effort to limit this patient's exposure and mitigate transmission in our community.  Due to his co-morbid illnesses, this patient is at least at moderate risk for complications without adequate follow up.  This format is felt to be most appropriate for this patient at this time.  The patient did not have access to video technology/had technical difficulties with video requiring transitioning to audio format only (telephone).  All issues noted in this document were discussed and addressed.  No physical exam could be performed with this format.  Please refer to the patient's chart for his  consent to telehealth for [facility]. ? ?Evaluation Performed:  Preoperative cardiovascular risk assessment ?_____________  ? ?Date:  09/13/2021  ? ?Patient ID:  Corey Salas, DOB 11-05-1960, MRN [id number] ?Patient Location:  ?Home ?Provider location:   ?Office ? ?Primary Care Provider:  Arnette Felts, FNP ?Primary Cardiologist:  Thurmon Fair, MD ? ?Chief Complaint  ?  ?61 y.o. y/o male with a h/o coronary artery disease, hyperlipidemia, atherosclerotic cerebrovascular disease, carotid artery disease, Prinzmetal angina, peripheral neuropathy who is pending colonoscopy/endoscopy, and presents today for telephonic preoperative cardiovascular risk assessment. ? ?Past Medical History  ?  ?Past Medical History:  ?Diagnosis Date  ? Anginal pain (HCC)   ? CAD (coronary artery disease)   ? Chest pain   ? Dyspnea   ? 08/23/20- "just before my heart attack, it is normal now, unless I I am stressed/"  ? Family history of adverse reaction to anesthesia 08/21/2020  ? Hypertension   ? Neuromuscular disorder (HCC)   ? neuropathy in both feet  ? Neuropathy   ? "bottom of my feet, they say it is from alcohol, I just drink beer now, not spirits."  ? PTSD  (post-traumatic stress disorder)   ? PTSD (post-traumatic stress disorder)   ? STEMI (ST elevation myocardial infarction) (HCC) 08/06/2020  ? Stroke [facility])   ? 08/23/20 slight weakness in right hand - can type and write- "I have to concentrate to keep pen in hand."  ? ?Past Surgical History:  ?Procedure Laterality Date  ? APPENDECTOMY    ? CORONARY ANGIOPLASTY WITH STENT PLACEMENT    ? 6X  ? CORONARY ARTERY BYPASS GRAFT N/A 10/17/2020  ? Procedure: CORONARY ARTERY BYPASS GRAFTING (CABG)x 4 USING BILATERAL INTERNAL MAMMARIES,  LEFT RADIAL ARTERY, AND RIGHT GSV, ON CARDIOPULMONARY BYPASS.;  Surgeon: Linden Dolin, MD;  Location: [facility] OR;  Service: Open Heart Surgery;  Laterality: N/A;  ? FRACTURE SURGERY    ? right tibia repair  ? LEFT HEART CATH AND CORONARY ANGIOGRAPHY N/A 10/05/2020  ? Procedure: LEFT HEART CATH AND CORONARY ANGIOGRAPHY;  Surgeon: [name], MD;  Location: [facility] INVASIVE CV LAB;  Service: Cardiovascular;  Laterality: N/A;  ? RADIAL ARTERY HARVEST Left 10/17/2020  ? Procedure: RADIAL ARTERY HARVEST;  Surgeon: Linden Dolin, MD;  Location: [facility] OR;  Service: Open Heart Surgery;  Laterality: Left;  ? TEE WITHOUT CARDIOVERSION N/A 10/17/2020  ? Procedure: TRANSESOPHAGEAL ECHOCARDIOGRAM (TEE);  Surgeon: Linden Dolin, MD;  Location: [facility] OR;  Service: Open Heart Surgery;  Laterality: N/A;  ? TRANSCAROTID ARTERY REVASCULARIZATION?  Left 09/13/2020  ? Procedure: TRANSCAROTID ARTERY REVASCULARIZATION LEFT;  Surgeon: Leonie Douglas, MD;  Location: [facility] OR;  Service: Vascular;  Laterality: Left;  ? VEIN HARVEST Right 10/17/2020  ? Procedure: VEIN HARVEST  OF RIGHT GREATER SAPHENOUS VEIN;  Surgeon: Linden DolinAtkins, Broadus Z, MD;  Location: [facility] OR;  Service: Open Heart Surgery;  Laterality: Right;  ? ? ?Allergies ? ?Allergies  ?Allergen Reactions  ? Sertraline   ?  Other reaction(s): Other ?Patient stated severe reaction, including suicidal ideations  ? Atorvastatin   ?  Swelling  "blew up" all over, [place]  ?  ? Rosuvastatin Diarrhea  ?  Crest[place]07[place]2020 [place]   Pt stopped taking ~ end of April 2022,  diarrhea resolved ~36 hours later.  Cardiologist Dr. Royann Shiversroitoru is aware he is not taking and will re-address lipid treatment after vascular procedures completed.   ? Toprol Xl [Metoprolol] Other (See Comments)  ?  Reports nausea and stomach upset  ? ? ?History of Present Illness  ?  ?Corey Salas is a 61 y.o. male who presents via audio/video conferencing for a telehealth visit today.  Pt was last seen in cardiology clinic on 03/14/2021 by Dr. Royann Shiversroitoru.  At that time Corey Salas noted musculoskeletal soreness from his bypass surgery, neuralgia type pain was noted, and he complained of hives while taking atorvastatin and was not able to take rosuvastatin due to GI upset. The patient is now pending procedure as outlined above. Since his last visit, he he remains stable from a cardiac standpoint. ? ?Today he denies chest pain, shortness of breath, lower extremity edema, fatigue, palpitations, hematuria, hemoptysis, diaphoresis, weakness, presyncope, syncope, orthopnea, and PND. ? ? ? ?Home Medications  ?  ?Prior to Admission medications   ?Medication Sig Start Date End Date Taking? Authorizing Provider  ?ALPRAZolam (XANAX) 1 MG tablet Take 1 tablet (1 mg total) by mouth every 8 (eight) hours as needed for anxiety. 04/11/21 04/11/22  Charlesetta IvoryGhumman, Ramandeep, NP  ?aspirin EC 81 MG tablet Take 1 tablet (81 mg total) by mouth daily. Swallow whole. ?Patient not taking: Reported on 05/28/2021 07/02/20   Thomasene Rippleobb, Kardie, DO  ?clopidogrel (PLAVIX) 75 MG tablet TAKE ONE TABLET BY MOUTH DAILY ?Patient not taking: Reported on 08/27/2021 05/21/21   Croitoru, Rachelle HoraMihai, MD  ?metoprolol tartrate (LOPRESSOR) 25 MG tablet Take 0.5 tablets (12.5 mg total) by mouth 2 (two) times daily. ?Patient not taking: Reported on 09/11/2021 05/17/21   Croitoru, Mihai, MD  ?Multiple Vitamins-Minerals (CENTRUM SILVER 50+MEN)  TABS Take 1 tablet by mouth daily. ?Patient not taking: Reported on 05/28/2021    [provider]  ?nicotine (NICODERM CQ - DOSED IN MG/24 HOURS) 21 mg/24hr patch Place 1 patch (21 mg total) onto the skin daily. 03/14/21   Croitoru, Mihai, MD  ?sildenafil (VIAGRA) 50 MG tablet TAKE ONE TABLET BY MOUTH DAILY AS NEEDED ?Patient not taking: Reported on 09/11/2021 07/12/21   Croitoru, Rachelle HoraMihai, MD  ? ? ?Physical Exam  ?  ?Vital Signs:  Corey Salas does not have vital signs available for review today. ? ?Given telephonic nature of communication, physical exam is limited. ?AAOx3. NAD. Normal affect.  Speech and respirations are unlabored. ? ?Accessory Clinical Findings  ?  ?None ? ?Assessment & Plan  ?  ?1.  Preoperative Cardiovascular Risk Assessment: [facility], Dr. Levora AngelBrahmbhatt, endoscopy and colonoscopy ? ?  ? ?Primary Cardiologist: Thurmon FairMihai Croitoru, MD ? ?Chart reviewed as part of pre-operative protocol coverage. Given past medical history and time since last visit, based on ACC/AHA guidelines, Corey Salas would be at acceptable risk for the planned procedure without further cardiovascular testing.  ? ?Patient was advised that if  he develops new symptoms prior to surgery to contact our office to arrange a follow-up appointment.  He verbalized understanding.  He is able to complete greater than 4 METS of physical activity. ? ? ?His Plavix may be held for 3-5 days prior to his procedure.  Please resume as soon as hemostasis is achieved. ? ?A copy of this note will be routed to requesting surgeon. ? ?Time:   ?Today, I have spent 7 minutes with the patient with telehealth technology discussing medical history, symptoms, and management plan.  I spent greater than 10 minutes reviewing the patient's past medical history and medications. ? ? ?Ronney Asters, NP ? ?09/13/2021, 7:27 AM ? ?

## 2021-09-23 ENCOUNTER — Encounter: Payer: Self-pay | Admitting: Cardiovascular Disease

## 2021-10-02 ENCOUNTER — Ambulatory Visit
Admission: RE | Admit: 2021-10-02 | Discharge: 2021-10-02 | Disposition: A | Discharge status: Home / Self Care | Payer: Managed Care, Other (non HMO) | Source: Ambulatory Visit | Attending: Physician Assistant | Admitting: Physician Assistant

## 2021-10-02 DIAGNOSIS — R7989 Other specified abnormal findings of blood chemistry: Secondary | ICD-10-CM

## 2021-10-02 DIAGNOSIS — R634 Abnormal weight loss: Secondary | ICD-10-CM

## 2021-10-02 DIAGNOSIS — R109 Unspecified abdominal pain: Secondary | ICD-10-CM

## 2021-10-02 MED ORDER — IOPAMIDOL (ISOVUE-300) INJECTION 61%
100.0000 mL | Freq: Once | INTRAVENOUS | Status: AC | PRN
  Administered 2021-10-02: 100 mL via INTRAVENOUS

## 2021-10-17 ENCOUNTER — Other Ambulatory Visit: Payer: Self-pay | Admitting: Cardiovascular Disease

## 2021-10-31 ENCOUNTER — Ambulatory Visit (HOSPITAL_COMMUNITY)
Admission: EM | Admit: 2021-10-31 | Discharge: 2021-10-31 | Disposition: A | Discharge status: Home / Self Care | Payer: 59

## 2021-10-31 DIAGNOSIS — Z638 Other specified problems related to primary support group: Secondary | ICD-10-CM

## 2021-10-31 DIAGNOSIS — F10929 Alcohol use, unspecified with intoxication, unspecified: Secondary | ICD-10-CM | POA: Diagnosis not present

## 2021-10-31 NOTE — ED Triage Notes (Signed)
Pt presents to [facility] voluntarily, accompanied by [employer] at this time. Pt states he was having a bad day and admits to struggling mentally. Pt reports having PTSD and having flashbacks but did not elaborate on what he was experiencing. Pt appears to have speech difficulty as a result of a stroke,but he is able to answer the questions that are posed. Pt stated he called the mobile crisis line and he was picked up from his home by the sherriff. Pt denies SI,HI, AVH.

## 2021-10-31 NOTE — ED Provider Notes (Signed)
Behavioral Health Urgent Care Medical Screening Exam  Patient Name: Corey Salas MRN: [id number] Date of Evaluation: 10/31/21 Chief Complaint:  "I had a bad day".  Diagnosis:  Final diagnoses:  Alcoholic intoxication with complication (HCC)  Family discord    History of Present illness: Corey Salas is a 61 y.o. male. Who was brought in by [facility] voluntarily. Pt reports " the police brought me " " I was at home and feeling lonely and pressured from all what has happened to me in the past, I called my health care provider, and it went down the line". The patient denies SI/HI/AVH. Pt reports that he was diagnosed with PTSD 13-14 years ago, and " it flared today". Pt reports " I was having flashbacks, and I try to fight them and eventually it comes out this way". Pt is unable to recall the experiences that led to his PTSD. Pt states " I don't know what I have PTSD from". I had a few beers today , about 4 beers, and I haven't drank this week except today". " I have liver problems and quadruple bypass surgery, so I stopped drinking". Pt denies use of illicit drugs. Pt endorses tobacco use, stating " I roll my own tobacco". Pt denies tobacco abuse. Pt states " I want to go home or to a hotel, I don't want to stay here, Im good now". Pt states " If my wife wants me to come home, I will, otherwise, I will go to a hotel to spend the night".  Pt gave this provider permission to call his wife to pick him up from the [facility].   Collateral information received from the patient's wife who was contacted at [phone]. Pt's wife reported that he had been drinking all day, and drinks about two bottles of beer for the past two weeks. She reports that he had been on the phone today with different people, calling her job, HR, and telling them how mean she is to him. She reports that he is just out of control today,and needs to stop using alcohol as an excuse for his bad behaviors. She reports that this situation has  been on-going for 11 years and she is done with him because she is too old for all this. She reports that she came home from her Dr's appointment because she was not feeling well. She went and laid down and left him alone. He called everyone and then the police to come pick him up. She states that he regularly calls the cops and tells them he is afraid of her, she is aggressive and mean to him, but she is not. She reports that the cops do nothing to her because they know he is being manipulative and untruthful. She reports that he has been to several outpatient programs for substance use disorder, but he wont do what they recommend because he doesn't believe in those treatment programs. She agrees she will come pick him up because he has no one else to help him because he is badly behaved.   On evaluation, patient is alert, oriented x 3, and cooperative. Speech is slurred, and logical. Pt appears well casual with a faint smell of alcohol. Eye contact is good. Mood is euthymic, affect is congruent with mood. Thought process and thought content is coherent. Pt denies SI/HI/AVH. There is no indication that the patient is responding to internal stimuli. No delusions elicited during this assessment.    Psychiatric Specialty Exam  Presentation  General  Appearance:Casual  Eye Contact:Good  Speech:Slurred  Speech Volume:Normal  Handedness:Right   Mood and Affect  Mood:Euthymic  Affect:Congruent   Thought Process  Thought Processes:Coherent  Descriptions of Associations:Intact  Orientation:Full (Time, Place and Person)  Thought Content:Logical    Hallucinations:None  Ideas of Reference:None  Suicidal Thoughts:No  Homicidal Thoughts:No   Sensorium  Memory:Immediate Fair; Recent Fair  Judgment:Fair  Insight:Fair   Executive Functions  Concentration:Fair  Attention Span:Fair  Recall:Fair  Fund of Knowledge:Fair  Language:Fair   Psychomotor Activity  Psychomotor  Activity:Normal   Assets  Assets:Communication Skills; Desire for Improvement; Financial Resources/Insurance; Housing; Intimacy   Sleep  Sleep:Fair  Number of hours: No data recorded  Nutritional Assessment (For OBS and FBC admissions only) Has the patient had a weight loss or gain of 10 pounds or more in the last 3 months?: No Has the patient had a decrease in food intake/or appetite?: No Does the patient have dental problems?: No Does the patient have eating habits or behaviors that may be indicators of an eating disorder including binging or inducing vomiting?: No Has the patient recently lost weight without trying?: 0 Has the patient been eating poorly because of a decreased appetite?: 0 Malnutrition Screening Tool Score: 0    Physical Exam: Physical Exam Constitutional:      General: He is not in acute distress.    Appearance: He is not diaphoretic.  HENT:     Head: Normocephalic.     Right Ear: External ear normal.     Left Ear: External ear normal.  Eyes:     General:        Right eye: No discharge.        Left eye: No discharge.  Cardiovascular:     Rate and Rhythm: Normal rate.  Pulmonary:     Effort: No respiratory distress.  Chest:     Chest wall: No tenderness.  Neurological:     Mental Status: He is alert and oriented to person, place, and time.  Psychiatric:        Attention and Perception: Attention and perception normal.        Mood and Affect: Mood and affect normal.        Speech: Speech is slurred.        Behavior: Behavior is cooperative.        Thought Content: Thought content normal. Thought content is not paranoid or delusional. Thought content does not include homicidal or suicidal ideation. Thought content does not include homicidal or suicidal plan.        Cognition and Memory: Memory is not impaired.        Judgment: Judgment is not impulsive.    Review of Systems  Constitutional:  Negative for diaphoresis and fever.  HENT:  Negative  for congestion.   Eyes:  Negative for discharge.  Respiratory:  Negative for cough and wheezing.   Cardiovascular:  Negative for chest pain and palpitations.  Gastrointestinal:  Negative for diarrhea, nausea and vomiting.  Neurological:  Negative for dizziness, seizures, loss of consciousness, weakness and headaches.  Psychiatric/Behavioral:  Positive for substance abuse.    Blood pressure 116/70, pulse 90, temperature 98.5 F (36.9 C), temperature source Oral, resp. rate 18, SpO2 100 %. There is no height or weight on file to calculate BMI.  Musculoskeletal: Strength & Muscle Tone: within normal limits Gait & Station: unsteady Patient leans: N/A   [facility] MSE Discharge Disposition for Follow up and Recommendations: Based on my evaluation the patient does  not appear to have an emergency medical condition and can be discharged with resources and follow up care in outpatient services for Substance Abuse Intensive Outpatient Program and Individual Therapy  Pt discharged safely home with wife   Mancel Bale, NP 10/31/2021, 8:53 PM

## 2021-10-31 NOTE — Progress Notes (Signed)
[facility] entered resources into AVS for outpatient, PHP, and AA meetings.  Corey Salas will utilize resources as needed.  [facility]y Salas [facility]

## 2021-10-31 NOTE — Discharge Instructions (Addendum)
Partial Hospitalization / Outpatient Therapy/Substance Use  [facility] [address] [place], [place] [address] [phone] They offer partial hospitalization and intensive outpatient therapy  [facility] Health PHP Call Jerel Shepherd at [phone] [facility]  [facility] [address] [place], [place] [address] [phone]  [facility] [address] [place], [place] [address] [phone]  [facility]  [address] [place], [place] [address] [phone]  AA Groups  Friday: Time  Meeting  Location      Address    Region 8:00 am Dr [facility]        [address]   [place]   12:10 pm Mid-Day [facility]     [address]    [place]  7:00 pm Into [facility] [employer]    [address]    [place]  Saturday: 9:30 am Saturday Morning Men's [facility]      [address] [place] 12:10 pm [facility]       [address] [place]  Discharge recommendations:  Patient is to take medications as prescribed. Please see information for follow-up appointment with psychiatry and therapy. Please follow up with your primary care provider for all medical related needs.   Therapy: We recommend that patient participate in individual therapy to address mental health concerns.  Medications: The parent/guardian is to contact a medical professional and/or outpatient provider to address any new side effects that develop. Parent/guardian should update outpatient providers of any new medications and/or medication changes.   Safety:  The patient should abstain from use of illicit substances/drugs and abuse of any medications. If symptoms worsen or do not continue to improve or if the patient becomes actively suicidal or homicidal then it is recommended that the patient return to the closest hospital emergency department,  the [facility], or call 911 for further evaluation and treatment. National Suicide Prevention Lifeline 1-800-SUICIDE or 818-780-5169.  About 988 988 offers 24/7 access to trained crisis counselors who can help people experiencing mental health-related distress. People can call or text 988 or chat 988lifeline.org for themselves or if they are worried about a loved one who may need crisis support.

## 2021-11-28 ENCOUNTER — Ambulatory Visit: Payer: Managed Care, Other (non HMO) | Admitting: Cardiovascular Disease

## 2021-12-26 ENCOUNTER — Telehealth: Payer: Self-pay | Admitting: Cardiovascular Disease

## 2021-12-26 NOTE — Telephone Encounter (Signed)
*  STAT* If patient is at the pharmacy, call can be transferred to refill team.   1. Which medications need to be refilled? (please list name of each medication and dose if known)   sildenafil (VIAGRA) 50 MG tablet    2. Which pharmacy/location (including street and city if local pharmacy) is medication to be sent to?   [facility] [phone] F[address]Judie Petit [address] [place] [address] Store: [phone] Pharmacy: [phone]   3. Do they need a 30 day or 90 day supply? 90   Pt is in [place] right now and requesting refill be sent to pharmacy above.

## 2021-12-27 MED ORDER — SILDENAFIL CITRATE 50 MG PO TABS: 50.0000 mg | ORAL_TABLET | Freq: Every day | ORAL | 3 refills | Status: AC | PRN

## 2021-12-27 NOTE — Telephone Encounter (Signed)
Refill has been sent to [facility] in [place], [place].

## 2021-12-30 ENCOUNTER — Ambulatory Visit: Payer: Managed Care, Other (non HMO) | Admitting: Nurse Practitioner

## 2022-01-08 IMAGING — DX DG CHEST 1V PORT
1 series · 1 of 1 positions shown · non-contrast
Comparison: Chest radiograph 10/19/2020.

CLINICAL DATA: Status post open heart surgery.

EXAM:
PORTABLE CHEST 1 VIEW

[chest ap]
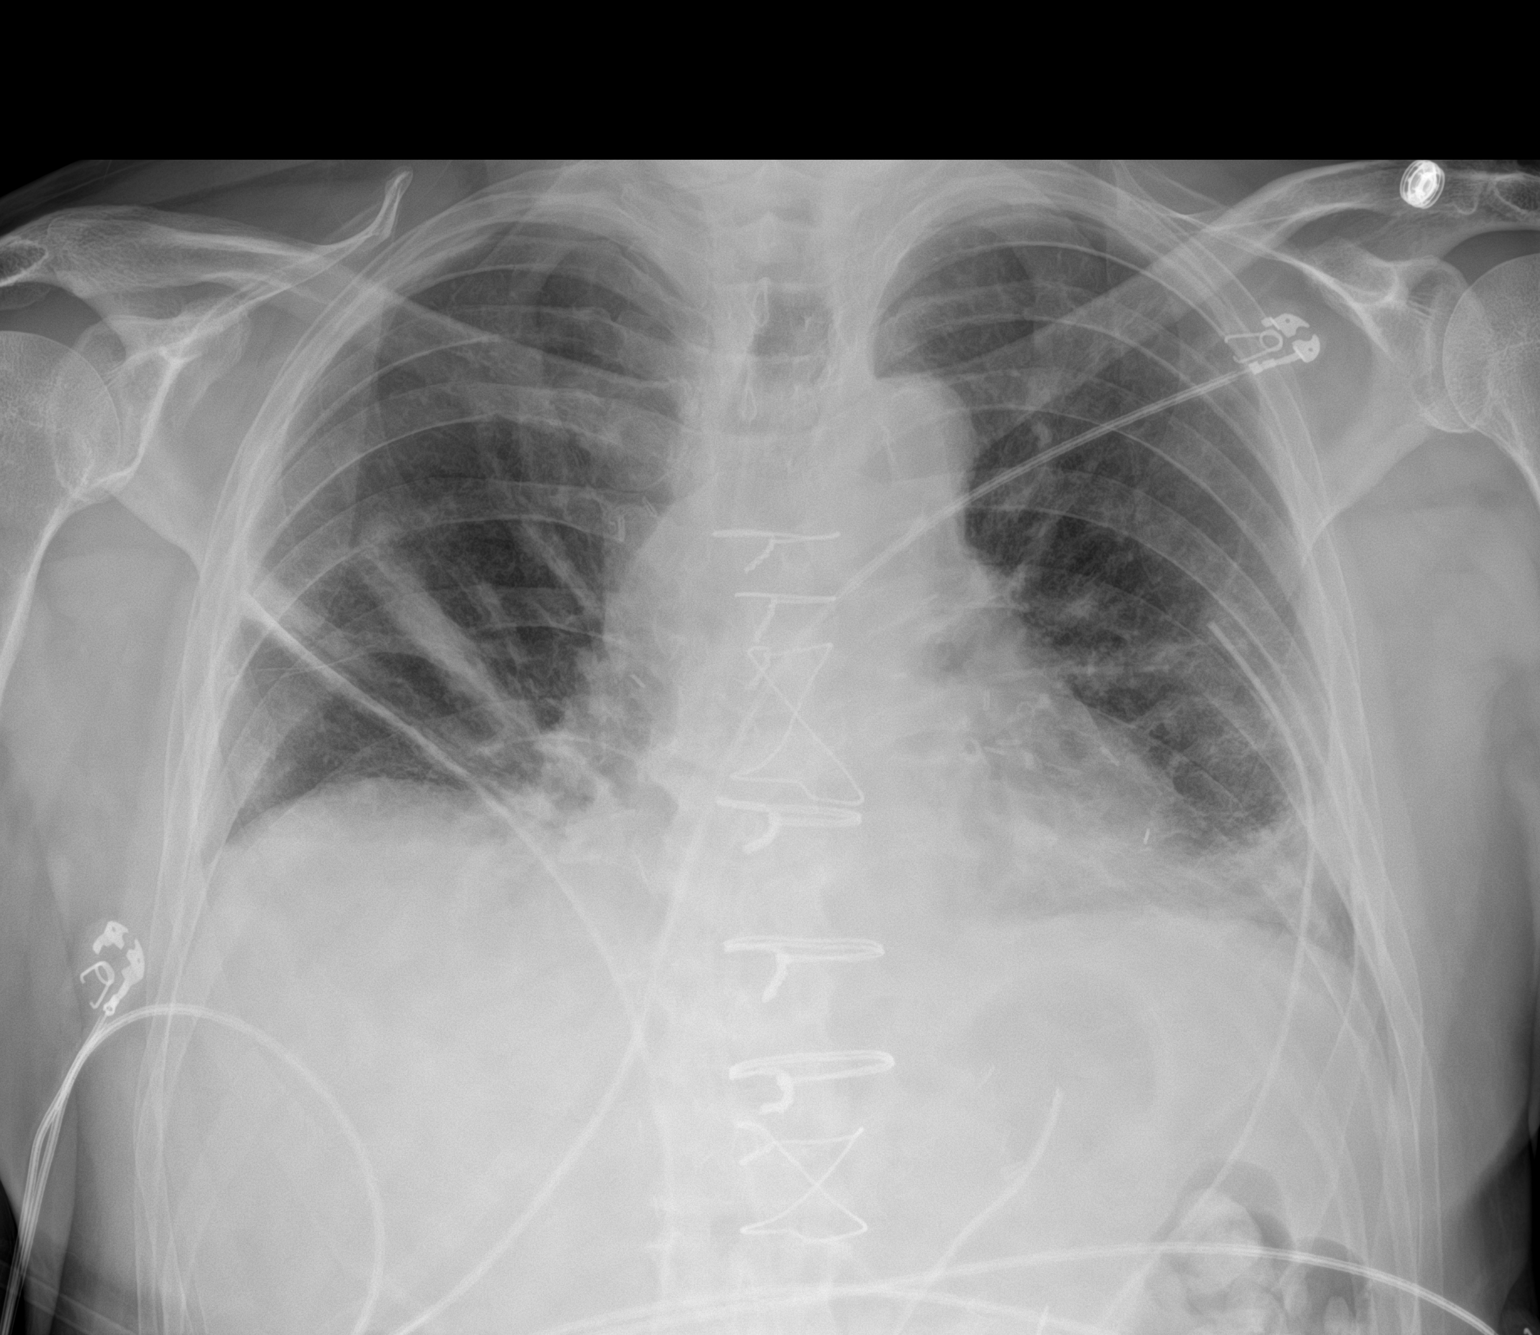

[1 of 1 positions shown; findings below may reference images not displayed]

FINDINGS: Monitoring leads overlie the patient. Status post median sternotomy.
Interval removal right IJ sheath. Stable position left chest tube.
Stable position right chest tube. Interval increase in patchy
heterogeneous opacities within the right greater than left lung
bases. No definite pleural effusion or pneumothorax.
IMPRESSION: Increased right greater than left basilar airspace opacities which
may represent atelectasis or infection.

Bilateral chest tubes redemonstrated.

Interval removal IJ sheath.

## 2022-02-28 IMAGING — CR DG CHEST 2V
2 series · 2 of 2 positions shown · non-contrast
Comparison: 11/08/2020

CLINICAL DATA: History of CABG shortness of breath

EXAM:
CHEST - 2 VIEW

[w chest pa]
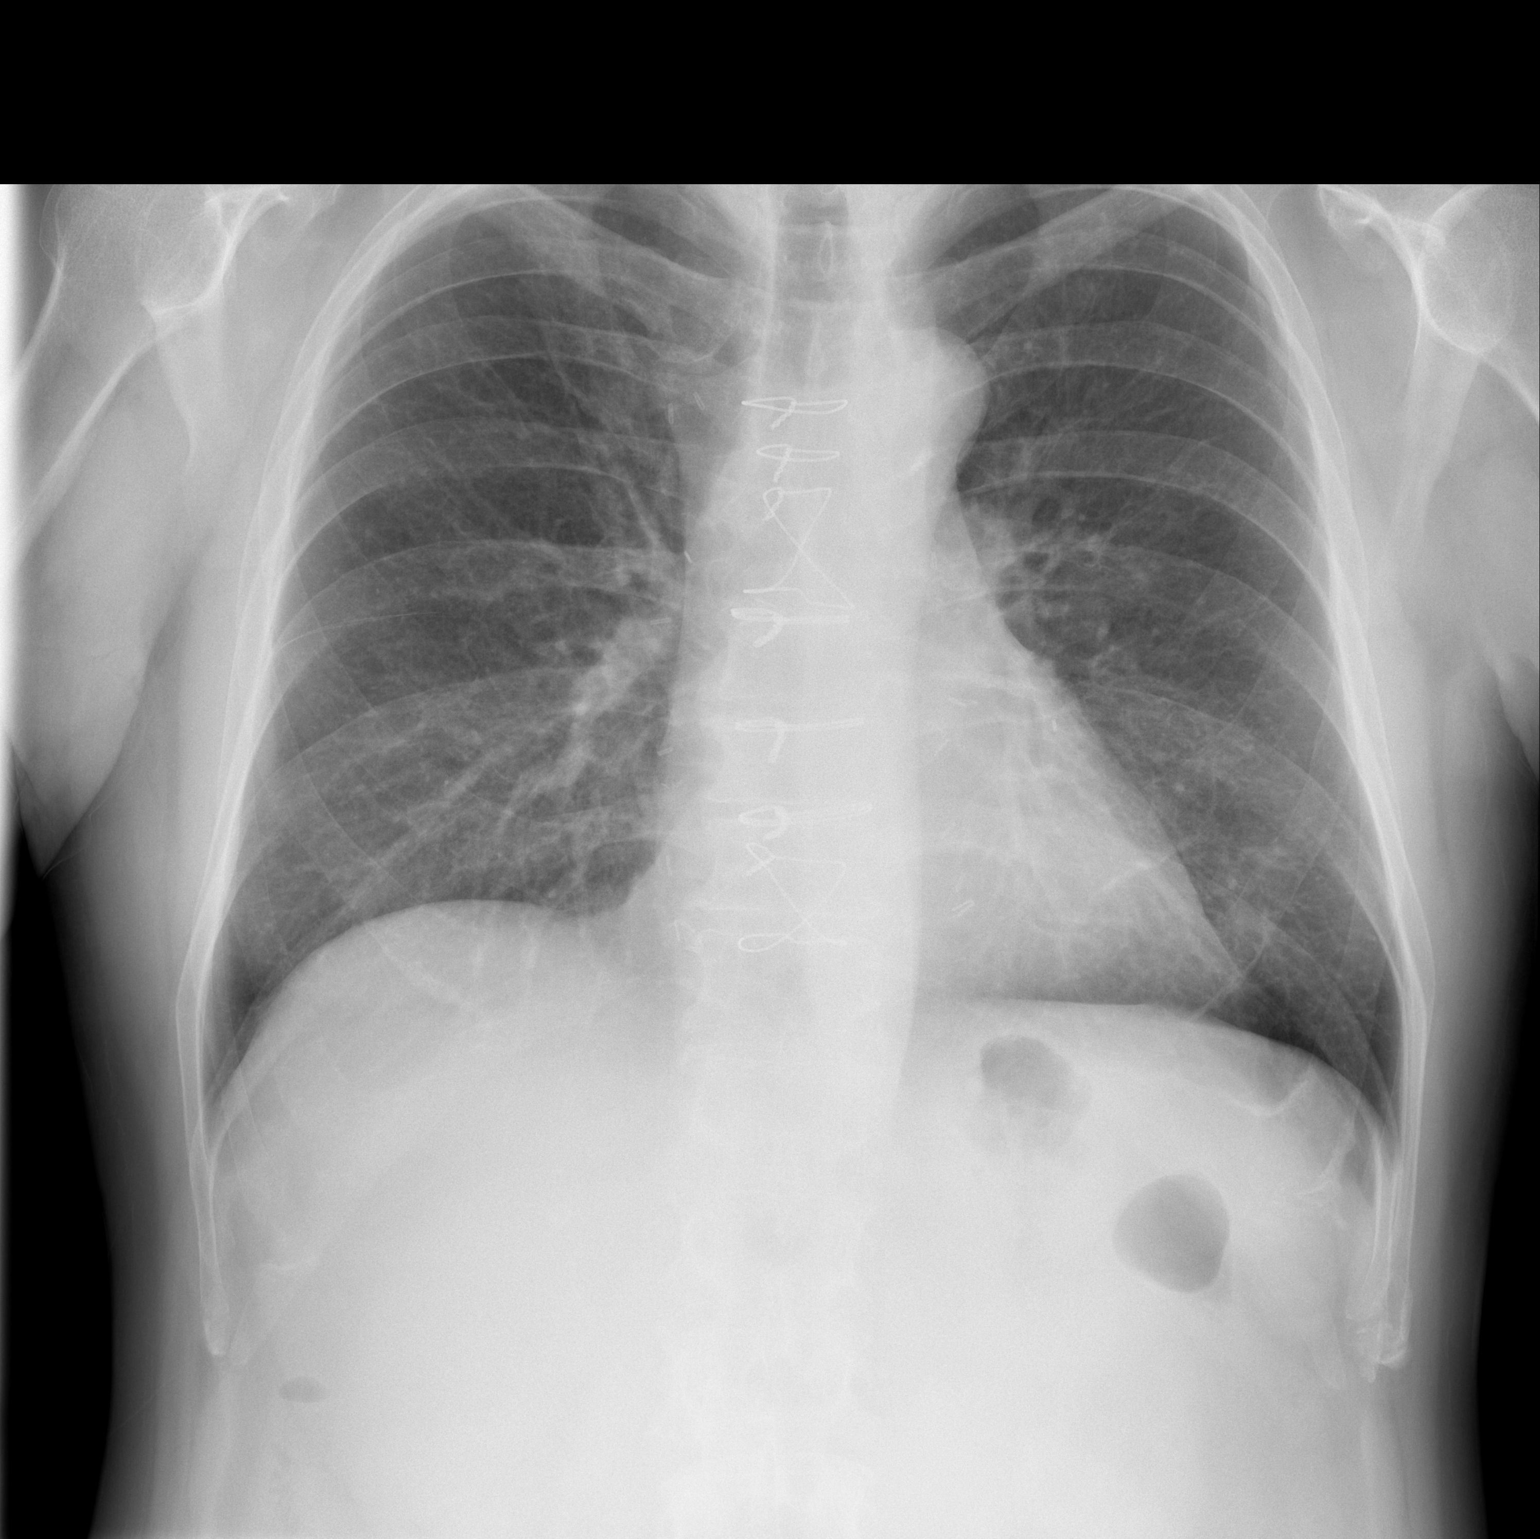

[w chest lat]
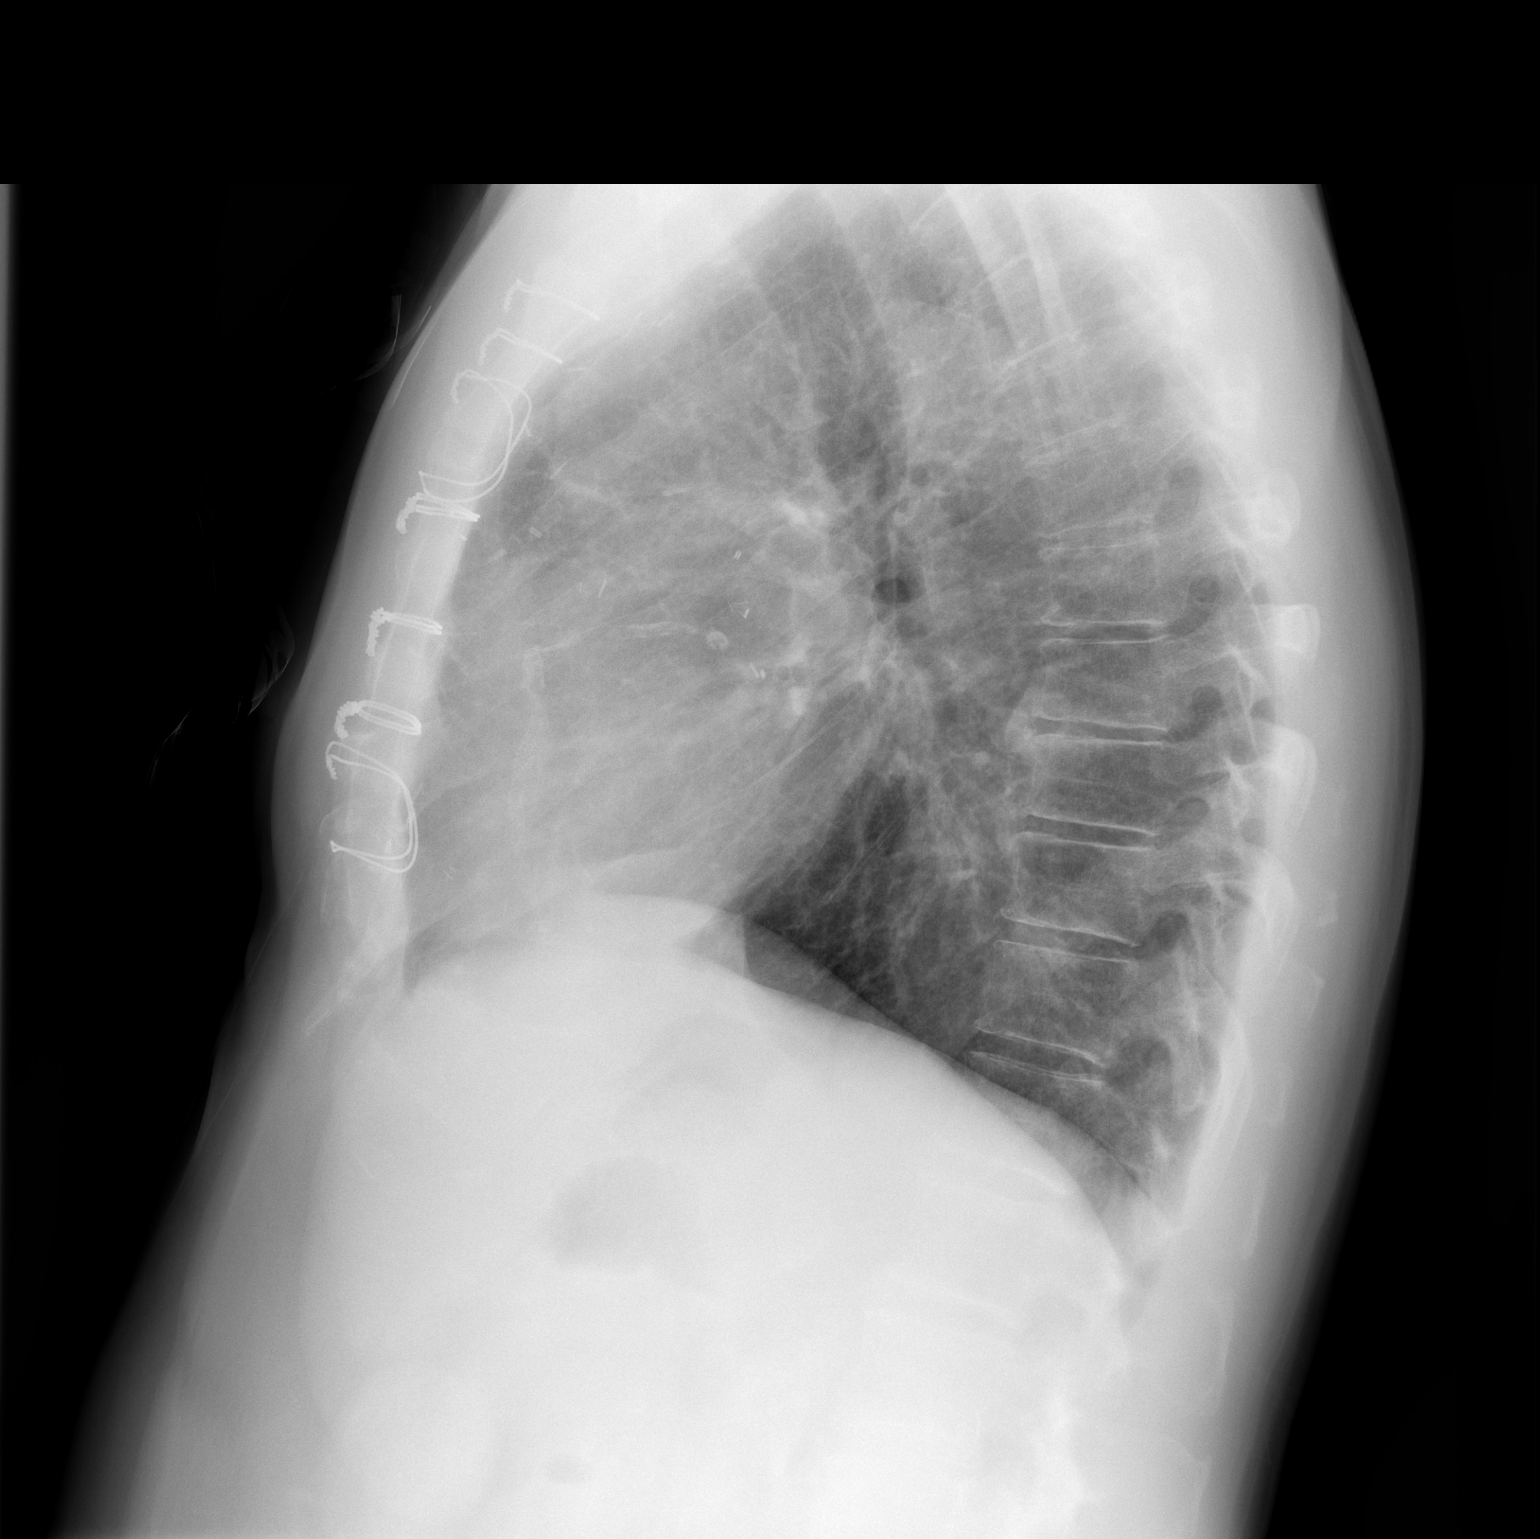

[2 of 2 positions shown; findings below may reference images not displayed]

FINDINGS: Post sternotomy changes. Minimal atelectasis or scar at the left
base. No consolidation, pleural effusion, or pneumothorax.
IMPRESSION: No active cardiopulmonary disease.

## 2022-12-21 IMAGING — CT CT ABD-PELV W/ CM
1 of 3 series · 12 of 32 positions shown, 17 images · IV contrast (APPLIED)
Comparison: No prior abdominal imaging.

CLINICAL DATA: Weight loss. Abdominal pain. Elevated liver function
tests.

EXAM:
CT ABDOMEN AND PELVIS WITH CONTRAST
TECHNIQUE: Multidetector CT imaging of the abdomen and pelvis was performed
using the standard protocol following bolus administration of
intravenous contrast.

[Series 2: abd/pelvis w/cm · axial · 0.78mm/px · z∈[-632,-216]mm · 12 of 97 slices shown, 17 images]
[im 7/97  soft-tissue]
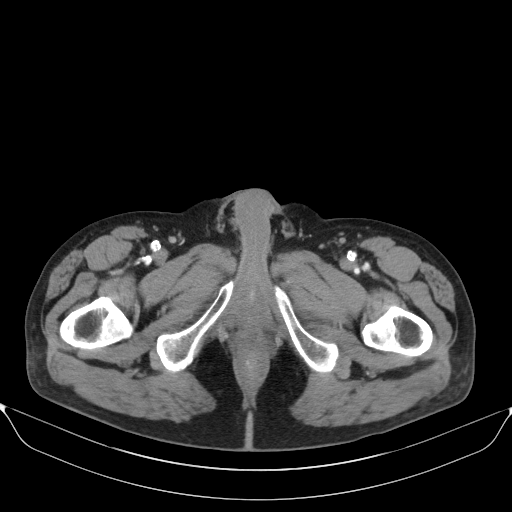
[im 7/97  bone]
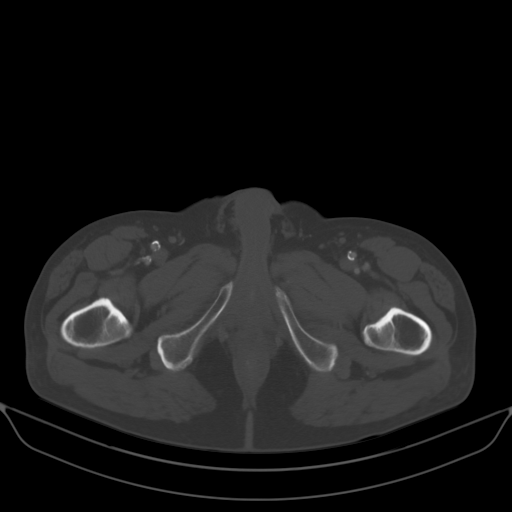
[im 14/97  soft-tissue]
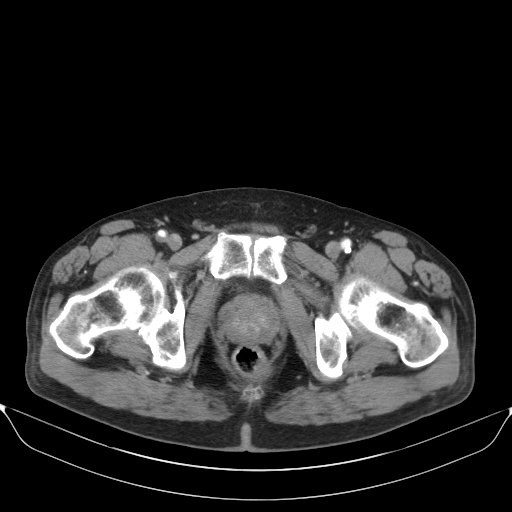
[im 21/97  soft-tissue]
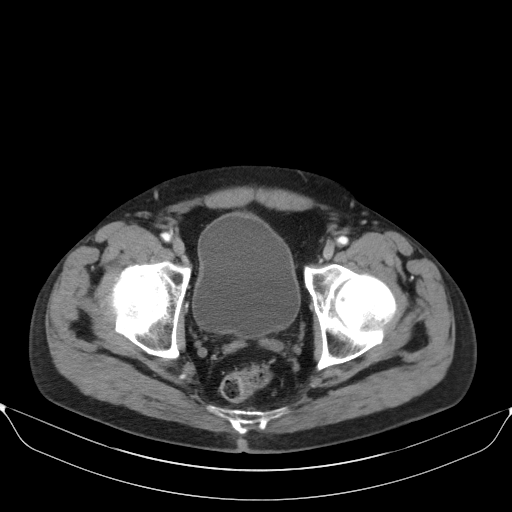
[im 35/97  soft-tissue]
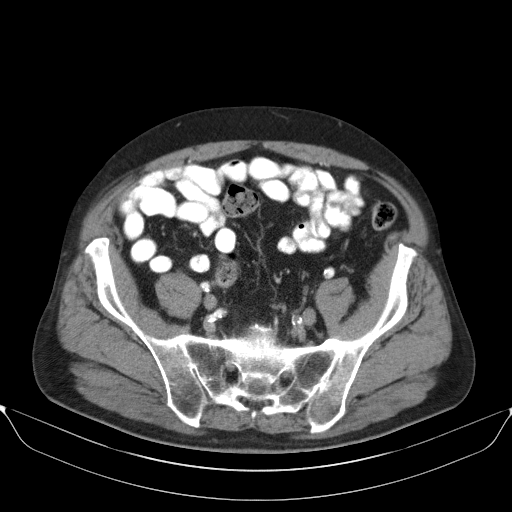
[im 42/97  soft-tissue]
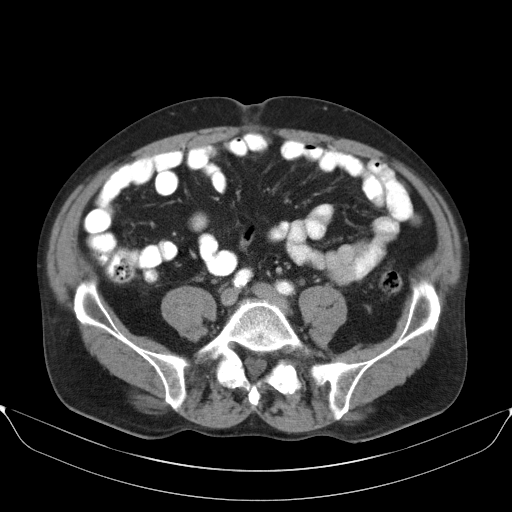
[im 49/97  soft-tissue]
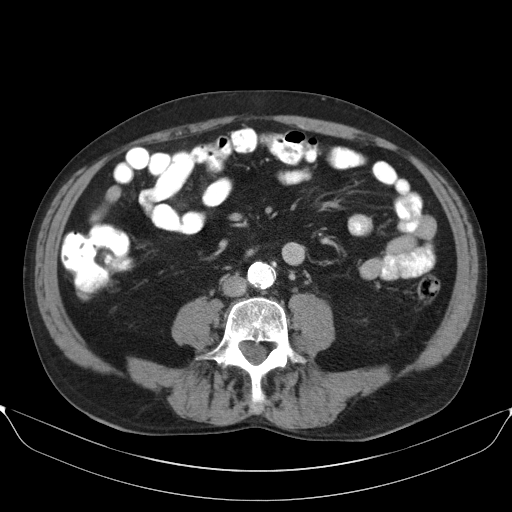
[im 55/97  soft-tissue]
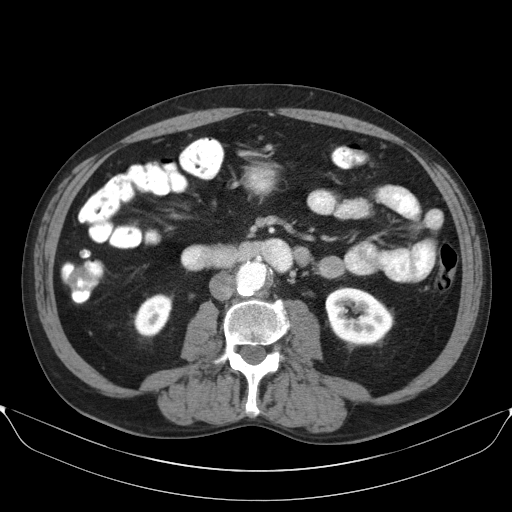
[im 62/97  soft-tissue]
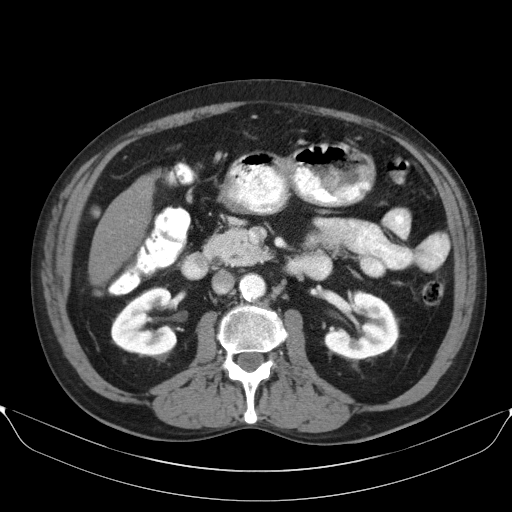
[im 69/97  lung]
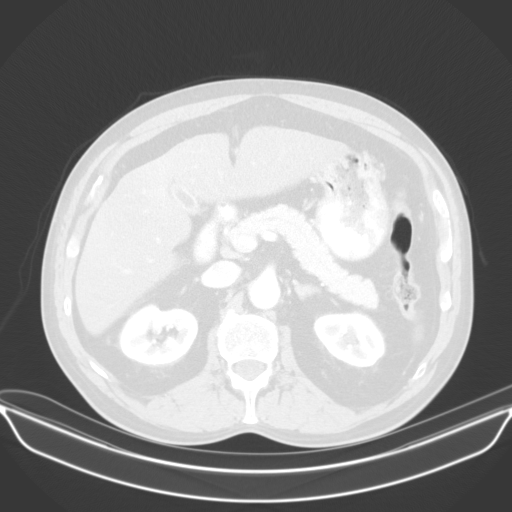
[im 76/97  soft-tissue]
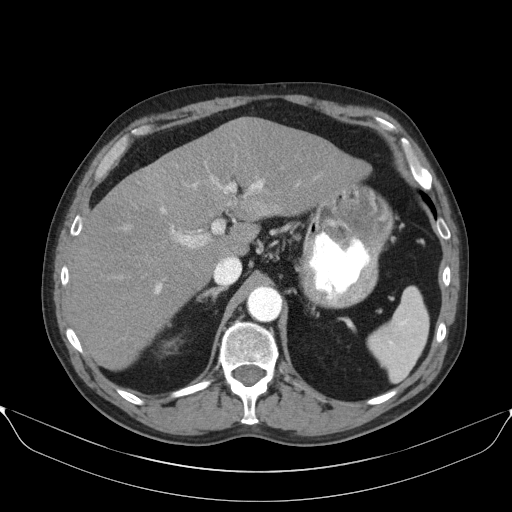
[im 76/97  lung]
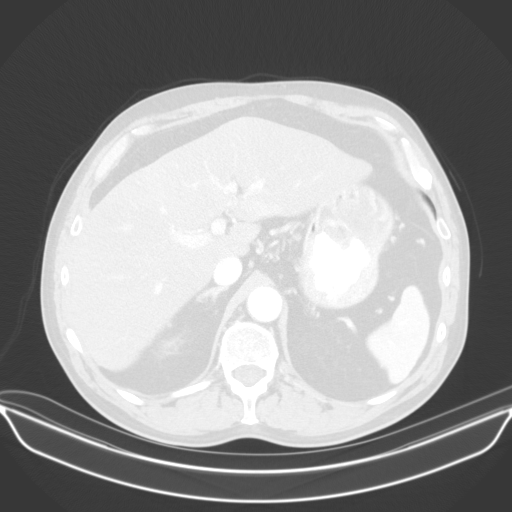
[im 76/97  bone]
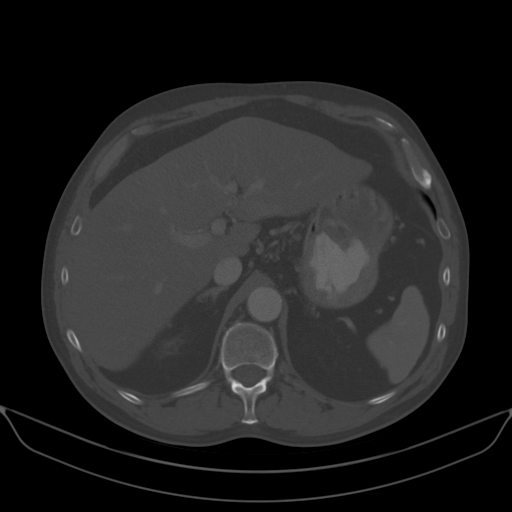
[im 83/97  soft-tissue]
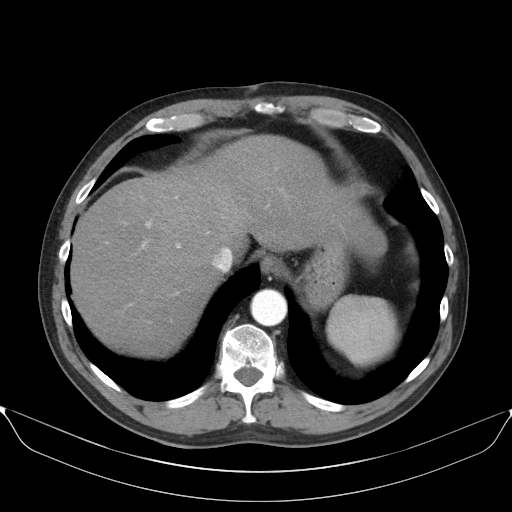
[im 83/97  lung]
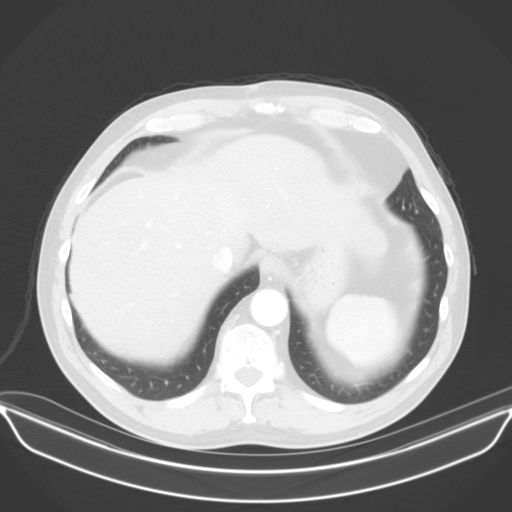
[im 90/97  soft-tissue]
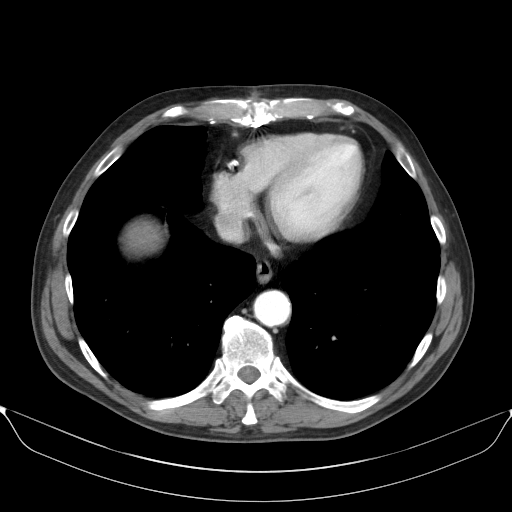
[im 90/97  lung]
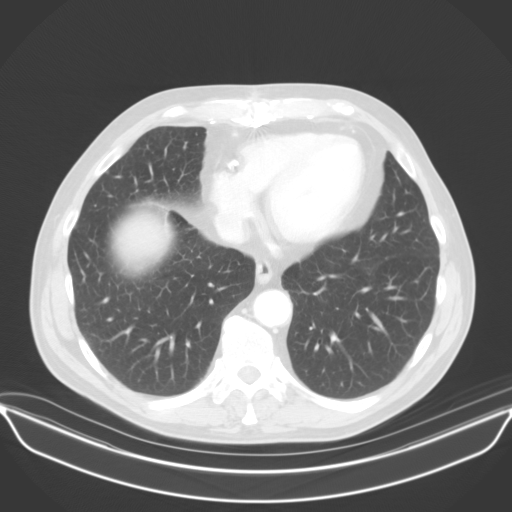

[12 of 32 positions shown; findings below may reference images not displayed]

RADIATION DOSE REDUCTION: This exam was performed according to the
departmental dose-optimization program which includes automated
exposure control, adjustment of the mA and/or kV according to
patient size and/or use of iterative reconstruction technique.

CONTRAST:  100mL [id number] IOPAMIDOL ([id number]) INJECTION 61%
FINDINGS: Lower chest: No basilar airspace disease or pulmonary nodule. No
pleural effusion. Median sternotomy with coronary artery
calcifications. The heart is normal in size.

Hepatobiliary: Diffusely decreased hepatic density typical of
steatosis. No focal liver lesion. No capsular nodularity.
Pericapsular calcification abutting the medial right lobe of the
liver appears to be extracapsular. The gallbladder is only minimally
distended. No calcified gallstone. No pericholecystic inflammation.
No biliary dilatation.

Pancreas: Unremarkable. No pancreatic ductal dilatation or
surrounding inflammatory changes. No pancreatic mass.

Spleen: Normal in size without focal abnormality.

Adrenals/Urinary Tract: No adrenal nodule. No hydronephrosis. No
focal renal lesion or solid mass. No renal calculi. Homogeneous
enhancement with symmetric excretion on delayed phase imaging.
Unremarkable urinary bladder. There is no focal bladder abnormality.

Stomach/Bowel: No gastric wall thickening, unremarkable appearance
of the stomach. Normal appearing small bowel. No obstruction, wall
thickening, or inflammation. Appendectomy. Administered enteric
contrast reaches distal transverse colon. Occasional descending and
sigmoid colonic diverticula without diverticulitis. There is no
colonic wall thickening. 2.5 cm length segment of nondistended
sigmoid is surrounded by stool, series 2, image 69.

Vascular/Lymphatic: Moderately advanced aortic atherosclerosis.
Distal aortic aneurysm maximal dimension 3.4 cm with mixed calcified
noncalcified atheromatous plaque. There is no periaortic stranding
or acute aortic inflammation. Patent portal vein. Moderate iliac
atherosclerosis with varying degrees of narrowing, which appears
prominent at the left and right proximal common femoral artery.
Peripheral vascular disease is only partially included in the field
of view. Small periportal nodes are typically reactive, not enlarged
by size criteria. No enlarged lymph nodes in the abdomen or pelvis.

Reproductive: Prostate is unremarkable.

Other: No ascites. No omental thickening. Small fat containing
umbilical hernia.

Musculoskeletal: Unilateral right L5 pars defect. No listhesis.
There are no acute or suspicious osseous abnormalities.
IMPRESSION: 1. No acute abnormality or explanation for abdominal pain.
2. Short segment of nondistended sigmoid colon is surrounded by
stool on either side. This is presumably related to peristalsis or
nondistention, the possibility of colonic lesion is not entirely
excluded on imaging findings alone. Recommend up-to-date
colonoscopy.
3. Hepatic steatosis without focal liver lesion.
4. Distal aortic aneurysm maximal dimension 3.4 cm. Recommend
follow-up every 3 years.
Reference: [HOSPITAL] [id number];[DATE].
5. Moderate iliac atherosclerosis with varying degrees of narrowing,
which appears prominent at the left and right proximal common
femoral artery.
6. Unilateral right L5 pars defect without listhesis.

Aortic Atherosclerosis ([id number]).
